# Patient Record
Sex: Male | Born: 1955 | Race: White | Hispanic: No | Marital: Married | Smoking: Former smoker
Health system: Southern US, Community
[De-identification: ages and names within clinical notes are randomized; demographics above are authoritative.]

## PROBLEM LIST (undated history)

## (undated) DIAGNOSIS — I10 Essential (primary) hypertension: Secondary | ICD-10-CM

## (undated) DIAGNOSIS — I2510 Atherosclerotic heart disease of native coronary artery without angina pectoris: Secondary | ICD-10-CM

## (undated) DIAGNOSIS — K635 Polyp of colon: Secondary | ICD-10-CM

## (undated) DIAGNOSIS — Z86718 Personal history of other venous thrombosis and embolism: Secondary | ICD-10-CM

## (undated) HISTORY — PX: CORONARY STENT INTERVENTION: CATH118234

---

## 2011-12-01 DIAGNOSIS — M549 Dorsalgia, unspecified: Secondary | ICD-10-CM | POA: Insufficient documentation

## 2012-07-06 DIAGNOSIS — R55 Syncope and collapse: Secondary | ICD-10-CM | POA: Insufficient documentation

## 2012-07-06 DIAGNOSIS — R350 Frequency of micturition: Secondary | ICD-10-CM | POA: Insufficient documentation

## 2012-07-06 DIAGNOSIS — K769 Liver disease, unspecified: Secondary | ICD-10-CM | POA: Diagnosis present

## 2012-08-16 DIAGNOSIS — G2581 Restless legs syndrome: Secondary | ICD-10-CM | POA: Insufficient documentation

## 2012-11-29 DIAGNOSIS — R209 Unspecified disturbances of skin sensation: Secondary | ICD-10-CM | POA: Insufficient documentation

## 2012-11-29 DIAGNOSIS — N400 Enlarged prostate without lower urinary tract symptoms: Secondary | ICD-10-CM | POA: Insufficient documentation

## 2013-01-17 DIAGNOSIS — D126 Benign neoplasm of colon, unspecified: Secondary | ICD-10-CM | POA: Insufficient documentation

## 2013-03-11 DIAGNOSIS — E785 Hyperlipidemia, unspecified: Secondary | ICD-10-CM | POA: Insufficient documentation

## 2013-03-11 DIAGNOSIS — I10 Essential (primary) hypertension: Secondary | ICD-10-CM | POA: Insufficient documentation

## 2013-03-11 DIAGNOSIS — I480 Paroxysmal atrial fibrillation: Secondary | ICD-10-CM | POA: Insufficient documentation

## 2013-03-11 DIAGNOSIS — K219 Gastro-esophageal reflux disease without esophagitis: Secondary | ICD-10-CM | POA: Insufficient documentation

## 2013-08-22 DIAGNOSIS — N529 Male erectile dysfunction, unspecified: Secondary | ICD-10-CM | POA: Insufficient documentation

## 2013-08-22 DIAGNOSIS — E538 Deficiency of other specified B group vitamins: Secondary | ICD-10-CM | POA: Insufficient documentation

## 2014-11-25 DIAGNOSIS — M722 Plantar fascial fibromatosis: Secondary | ICD-10-CM | POA: Insufficient documentation

## 2015-07-07 DIAGNOSIS — Z955 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: Secondary | ICD-10-CM | POA: Insufficient documentation

## 2015-07-07 DIAGNOSIS — R079 Chest pain, unspecified: Secondary | ICD-10-CM | POA: Insufficient documentation

## 2017-12-19 ENCOUNTER — Emergency Department (HOSPITAL_BASED_OUTPATIENT_CLINIC_OR_DEPARTMENT_OTHER)
Admission: EM | Admit: 2017-12-19 | Discharge: 2017-12-19 | Disposition: A | Discharge status: Home / Self Care | Payer: 59 | Attending: Emergency Medicine | Admitting: Emergency Medicine

## 2017-12-19 ENCOUNTER — Other Ambulatory Visit: Payer: Self-pay

## 2017-12-19 ENCOUNTER — Encounter (HOSPITAL_BASED_OUTPATIENT_CLINIC_OR_DEPARTMENT_OTHER): Payer: Self-pay

## 2017-12-19 DIAGNOSIS — Y999 Unspecified external cause status: Secondary | ICD-10-CM | POA: Diagnosis not present

## 2017-12-19 DIAGNOSIS — Y939 Activity, unspecified: Secondary | ICD-10-CM | POA: Diagnosis not present

## 2017-12-19 DIAGNOSIS — X58XXXA Exposure to other specified factors, initial encounter: Secondary | ICD-10-CM | POA: Diagnosis not present

## 2017-12-19 DIAGNOSIS — Z87891 Personal history of nicotine dependence: Secondary | ICD-10-CM | POA: Diagnosis not present

## 2017-12-19 DIAGNOSIS — S76311A Strain of muscle, fascia and tendon of the posterior muscle group at thigh level, right thigh, initial encounter: Secondary | ICD-10-CM | POA: Diagnosis not present

## 2017-12-19 DIAGNOSIS — Y929 Unspecified place or not applicable: Secondary | ICD-10-CM | POA: Diagnosis not present

## 2017-12-19 DIAGNOSIS — I2510 Atherosclerotic heart disease of native coronary artery without angina pectoris: Secondary | ICD-10-CM | POA: Diagnosis not present

## 2017-12-19 DIAGNOSIS — S76301A Unspecified injury of muscle, fascia and tendon of the posterior muscle group at thigh level, right thigh, initial encounter: Secondary | ICD-10-CM | POA: Diagnosis present

## 2017-12-19 DIAGNOSIS — Z955 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: Secondary | ICD-10-CM | POA: Insufficient documentation

## 2017-12-19 HISTORY — DX: Personal history of other venous thrombosis and embolism: Z86.718

## 2017-12-19 HISTORY — DX: Atherosclerotic heart disease of native coronary artery without angina pectoris: I25.10

## 2017-12-19 MED ORDER — KETOROLAC TROMETHAMINE 15 MG/ML IJ SOLN
15.0000 mg | Freq: Once | INTRAMUSCULAR | Status: AC
  Administered 2017-12-19: 15 mg via INTRAMUSCULAR
  Filled 2017-12-19: qty 1

## 2017-12-19 MED ORDER — DICLOFENAC SODIUM 1 % TD GEL: 4.0000 g | Freq: Four times a day (QID) | TRANSDERMAL | 0 refills | Status: DC

## 2017-12-19 MED ORDER — ACETAMINOPHEN 500 MG PO TABS
1000.0000 mg | ORAL_TABLET | Freq: Once | ORAL | Status: AC
  Administered 2017-12-19: 1000 mg via ORAL
  Filled 2017-12-19: qty 2

## 2017-12-19 MED ORDER — OXYCODONE HCL 5 MG PO TABS
5.0000 mg | ORAL_TABLET | Freq: Once | ORAL | Status: AC
  Administered 2017-12-19: 5 mg via ORAL
  Filled 2017-12-19: qty 1

## 2017-12-19 MED ORDER — DIAZEPAM 2 MG PO TABS
2.0000 mg | ORAL_TABLET | Freq: Once | ORAL | Status: AC
  Administered 2017-12-19: 2 mg via ORAL
  Filled 2017-12-19: qty 1

## 2017-12-19 MED FILL — DICLOFENAC SODIUM 1% GEL: 1 | 6 days supply | Qty: 100 | Fill #0

## 2017-12-19 NOTE — ED Triage Notes (Addendum)
C/o right hip pain radiates down right LE x 5 days-denies injury-to triage in w/c-NAD-pt felt better standing thought last half triage and walking to tx room

## 2017-12-19 NOTE — ED Provider Notes (Signed)
[facility] EMERGENCY DEPARTMENT Provider Note   CSN: [id number] Arrival date & time: 12/19/17  1110     History   Chief Complaint Chief Complaint  Patient presents with  . Hip Pain    HPI Jeremy LentSteve W Ryan is a 62 y.o. male.  62 yo M with a chief complaints of right leg pain.  Going on for the past couple days.  No noted injury.  Worse with sitting.  Denies leg weakness or numbness denies loss of bowel or bladder denies back pain.  Denies history of low back pain denies recent surgery or implementation of his back.  The history is provided by the patient.  Hip Pain  This is a new problem. The current episode started more than 2 days ago. The problem occurs constantly. The problem has been gradually worsening. Pertinent negatives include no chest pain, no abdominal pain, no headaches and no shortness of breath. The symptoms are aggravated by bending and twisting (sitting). Nothing relieves the symptoms. He has tried nothing for the symptoms. The treatment provided no relief.    Past Medical History:  Diagnosis Date  . Coronary artery disease   . History of blood clots     There are no active problems to display for this patient.   Past Surgical History:  Procedure Laterality Date  . CORONARY STENT INTERVENTION          Home Medications    Prior to Admission medications   Medication Sig Start Date End Date Taking? Authorizing Provider  diclofenac sodium (VOLTAREN) 1 % GEL Apply 4 g topically 4 (four) times daily. 12/19/17   Melene PlanFloyd, Bronsyn Shappell, DO    Family History No family history on file.  Social History Social History   Tobacco Use  . Smoking status: Former Games developermoker  . Smokeless tobacco: Never Used  Substance Use Topics  . Alcohol use: Never    Frequency: Never  . Drug use: Never     Allergies   Patient has no known allergies.   Review of Systems Review of Systems  Constitutional: Negative for chills and fever.  HENT: Negative for congestion and  facial swelling.   Eyes: Negative for discharge and visual disturbance.  Respiratory: Negative for shortness of breath.   Cardiovascular: Negative for chest pain and palpitations.  Gastrointestinal: Negative for abdominal pain, diarrhea and vomiting.  Musculoskeletal: Positive for arthralgias and gait problem. Negative for myalgias.  Skin: Negative for color change and rash.  Neurological: Negative for tremors, syncope and headaches.  Psychiatric/Behavioral: Negative for confusion and dysphoric mood.     Physical Exam Updated Vital Signs BP 134/87 (BP Location: Right Arm)   Pulse 60   Temp 98.4 F (36.9 C) (Oral)   Resp 18   Ht 5\' 10"  (1.778 m)   Wt 98.9 kg   SpO2 98%   BMI 31.28 kg/m   Physical Exam  Constitutional: He is oriented to person, place, and time. He appears well-developed and well-nourished.  HENT:  Head: Normocephalic and atraumatic.  Eyes: Pupils are equal, round, and reactive to light. EOM are normal.  Neck: Normal range of motion. Neck supple. No JVD present.  Cardiovascular: Normal rate and regular rhythm. Exam reveals no gallop and no friction rub.  No murmur heard. Pulmonary/Chest: No respiratory distress. He has no wheezes.  Abdominal: He exhibits no distension. There is no rebound and no guarding.  Musculoskeletal: Normal range of motion. He exhibits tenderness.  Tender to palpation worse at the fascia at the attachment  of the hamstring, reproduces his pain.  No pain with internal or external rotation of the leg, pulse motor and sensation is intact distally.  Ambulatory.  No clonus reflexes are 2+.  Neurological: He is alert and oriented to person, place, and time.  Skin: No rash noted. No pallor.  Psychiatric: He has a normal mood and affect. His behavior is normal.  Nursing note and vitals reviewed.    ED Treatments / Results  Labs (all labs ordered are listed, but only abnormal results are displayed) Labs Reviewed - No data to  display  EKG None  Radiology No results found.  Procedures Procedures (including critical care time)  Medications Ordered in ED Medications  acetaminophen (TYLENOL) tablet 1,000 mg (has no administration in time range)  ketorolac (TORADOL) 15 MG/ML injection 15 mg (has no administration in time range)  oxyCODONE (Oxy IR/ROXICODONE) immediate release tablet 5 mg (has no administration in time range)  diazepam (VALIUM) tablet 2 mg (has no administration in time range)     Initial Impression / Assessment and Plan / ED Course  I have reviewed the triage vital signs and the nursing notes.  Pertinent labs & imaging results that were available during my care of the patient were reviewed by me and considered in my medical decision making (see chart for details).     62 yo M with a chief complaint of right leg pain.  Pain is at the attachment of the hamstring to the issue him, most likely muscular strain.  We will have the patient follow-up with his family doctor.  Conservative therapy.  Discharge home.  11:49 AM:  I have discussed the diagnosis/risks/treatment options with the patient and family and believe the pt to be eligible for discharge home to follow-up with PCP. We also discussed returning to the ED immediately if new or worsening sx occur. We discussed the sx which are most concerning (e.g., sudden worsening pain, fever, inability to tolerate by mouth) that necessitate immediate return. Medications administered to the patient during their visit and any new prescriptions provided to the patient are listed below.  Medications given during this visit Medications  acetaminophen (TYLENOL) tablet 1,000 mg (has no administration in time range)  ketorolac (TORADOL) 15 MG/ML injection 15 mg (has no administration in time range)  oxyCODONE (Oxy IR/ROXICODONE) immediate release tablet 5 mg (has no administration in time range)  diazepam (VALIUM) tablet 2 mg (has no administration in time  range)      The patient appears reasonably screen and/or stabilized for discharge and I doubt any other medical condition or other [facility] requiring further screening, evaluation, or treatment in the ED at this time prior to discharge.    Final Clinical Impressions(s) / ED Diagnoses   Final diagnoses:  Hamstring strain, right, initial encounter    ED Discharge Orders         Ordered    diclofenac sodium (VOLTAREN) 1 % GEL  4 times daily     12/19/17 1138           Melene Plan, DO 12/19/17 1149

## 2017-12-19 NOTE — Discharge Instructions (Signed)
Take tylenol 1000mg(2 extra strength) four times a day.  ° °

## 2018-11-05 ENCOUNTER — Other Ambulatory Visit: Payer: Self-pay

## 2018-11-05 DIAGNOSIS — Z20822 Contact with and (suspected) exposure to covid-19: Secondary | ICD-10-CM

## 2018-11-07 LAB — SPECIMEN STATUS REPORT

## 2018-11-07 LAB — NOVEL CORONAVIRUS, NAA: SARS-CoV-2, NAA: NOT DETECTED

## 2020-02-17 DIAGNOSIS — E785 Hyperlipidemia, unspecified: Secondary | ICD-10-CM | POA: Insufficient documentation

## 2020-02-17 DIAGNOSIS — I2510 Atherosclerotic heart disease of native coronary artery without angina pectoris: Secondary | ICD-10-CM | POA: Insufficient documentation

## 2020-02-17 DIAGNOSIS — B029 Zoster without complications: Secondary | ICD-10-CM | POA: Insufficient documentation

## 2020-02-17 DIAGNOSIS — R7303 Prediabetes: Secondary | ICD-10-CM | POA: Insufficient documentation

## 2020-02-19 ENCOUNTER — Emergency Department (HOSPITAL_BASED_OUTPATIENT_CLINIC_OR_DEPARTMENT_OTHER)
Admission: EM | Admit: 2020-02-19 | Discharge: 2020-02-19 | Disposition: A | Discharge status: Home / Self Care | Payer: Medicare Other | Attending: Emergency Medicine | Admitting: Emergency Medicine

## 2020-02-19 ENCOUNTER — Encounter (HOSPITAL_BASED_OUTPATIENT_CLINIC_OR_DEPARTMENT_OTHER): Payer: Self-pay

## 2020-02-19 ENCOUNTER — Other Ambulatory Visit: Payer: Self-pay

## 2020-02-19 ENCOUNTER — Emergency Department (HOSPITAL_BASED_OUTPATIENT_CLINIC_OR_DEPARTMENT_OTHER): Payer: Medicare Other

## 2020-02-19 DIAGNOSIS — Z87891 Personal history of nicotine dependence: Secondary | ICD-10-CM | POA: Diagnosis not present

## 2020-02-19 DIAGNOSIS — Z955 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: Secondary | ICD-10-CM | POA: Diagnosis not present

## 2020-02-19 DIAGNOSIS — Z8616 Personal history of COVID-19: Secondary | ICD-10-CM | POA: Diagnosis not present

## 2020-02-19 DIAGNOSIS — R0602 Shortness of breath: Secondary | ICD-10-CM | POA: Diagnosis present

## 2020-02-19 DIAGNOSIS — I2510 Atherosclerotic heart disease of native coronary artery without angina pectoris: Secondary | ICD-10-CM | POA: Insufficient documentation

## 2020-02-19 DIAGNOSIS — I10 Essential (primary) hypertension: Secondary | ICD-10-CM | POA: Insufficient documentation

## 2020-02-19 HISTORY — DX: Essential (primary) hypertension: I10

## 2020-02-19 LAB — BASIC METABOLIC PANEL
Anion gap: 10 (ref 5–15)
BUN: 26 mg/dL — ABNORMAL HIGH (ref 8–23)
CO2: 24 mmol/L (ref 22–32)
Calcium: 9.6 mg/dL (ref 8.9–10.3)
Chloride: 103 mmol/L (ref 98–111)
Creatinine, Ser: 0.86 mg/dL (ref 0.61–1.24)
GFR, Estimated: >60 mL/min (ref >60)
Glucose, Bld: 150 mg/dL — ABNORMAL HIGH (ref 70–99)
Potassium: 4.1 mmol/L (ref 3.5–5.1)
Sodium: 137 mmol/L (ref 135–145)

## 2020-02-19 LAB — CBC
HCT: 45 % (ref 39.0–52.0)
Hemoglobin: 15.5 g/dL (ref 13.0–17.0)
MCH: 28.4 pg (ref 26.0–34.0)
MCHC: 34.4 g/dL (ref 30.0–36.0)
MCV: 82.4 fL (ref 80.0–100.0)
Platelets: 205 10*3/uL (ref 150–400)
RBC: 5.46 MIL/uL (ref 4.22–5.81)
RDW: 13.7 % (ref 11.5–15.5)
WBC: 6.6 10*3/uL (ref 4.0–10.5)
nRBC: 0 % (ref 0.0–0.2)

## 2020-02-19 LAB — TROPONIN I (HIGH SENSITIVITY): Troponin I (High Sensitivity): 5 ng/L (ref <18)

## 2020-02-19 LAB — BRAIN NATRIURETIC PEPTIDE: B Natriuretic Peptide: 37.7 pg/mL (ref 0.0–100.0)

## 2020-02-19 MED ORDER — AZITHROMYCIN 250 MG PO TABS
500.0000 mg | ORAL_TABLET | Freq: Once | ORAL | Status: AC
  Administered 2020-02-19: 500 mg via ORAL
  Filled 2020-02-19: qty 2

## 2020-02-19 MED ORDER — PREDNISONE 20 MG PO TABS
40.0000 mg | ORAL_TABLET | Freq: Once | ORAL | Status: AC
  Administered 2020-02-19: 40 mg via ORAL
  Filled 2020-02-19: qty 2

## 2020-02-19 MED ORDER — IOHEXOL 350 MG/ML SOLN
100.0000 mL | Freq: Once | INTRAVENOUS | Status: AC | PRN
  Administered 2020-02-19: 100 mL via INTRAVENOUS

## 2020-02-19 MED ORDER — AZITHROMYCIN 250 MG PO TABS: 250.0000 mg | ORAL_TABLET | Freq: Every day | ORAL | 0 refills | Status: AC

## 2020-02-19 MED ORDER — AEROCHAMBER PLUS FLO-VU LARGE MISC
1.0000 | Freq: Once | Status: AC
  Administered 2020-02-19: 1
  Filled 2020-02-19: qty 1

## 2020-02-19 MED ORDER — ALBUTEROL SULFATE HFA 108 (90 BASE) MCG/ACT IN AERS
8.0000 | INHALATION_SPRAY | Freq: Once | RESPIRATORY_TRACT | Status: AC
  Administered 2020-02-19: 8 via RESPIRATORY_TRACT
  Filled 2020-02-19: qty 6.7

## 2020-02-19 MED ORDER — ALBUTEROL SULFATE (2.5 MG/3ML) 0.083% IN NEBU
2.5000 mg | INHALATION_SOLUTION | Freq: Once | RESPIRATORY_TRACT | Status: AC
  Administered 2020-02-19: 2.5 mg via RESPIRATORY_TRACT
  Filled 2020-02-19: qty 3

## 2020-02-19 MED ORDER — PREDNISONE 10 MG PO TABS: 40.0000 mg | ORAL_TABLET | Freq: Every day | ORAL | 0 refills | Status: DC

## 2020-02-19 NOTE — ED Notes (Signed)
Pt on monitor and vitals cycling 

## 2020-02-19 NOTE — ED Triage Notes (Signed)
Pt states when he was getting dressed he started to get tingly all over and couldn't catch his breath. Pt reports aching pain on the R side of his chest along with shortness of breath. Pt states he feels nauseous. Pt endorses a stent placed 2013.

## 2020-02-19 NOTE — Discharge Instructions (Signed)
Use the albuterol inhaler at home, 4 puffs every 4 hours as needed, if you feel that you need more than that return to the hospital.  Take the antibiotics and the steroids for the next 4 days follow-up with your primary doctor for routine checkups and further evaluation of your shortness of breath.

## 2020-02-19 NOTE — ED Notes (Signed)
Pt has seen some improvement of abdominal pain from 10 to 7 after medication

## 2020-02-19 NOTE — ED Provider Notes (Signed)
[facility] EMERGENCY DEPARTMENT Provider Note   CSN: [id number] Arrival date & time: 02/19/20  0751     History Chief Complaint  Patient presents with  . Chest Pain    Jeremy Ryan is a 65 y.o. male.   Shortness of Breath Severity:  Severe Onset quality:  Sudden Timing:  Constant Progression:  Worsening Chronicity:  New Context: URI (recent covid)   Relieved by:  Nothing Worsened by:  Deep breathing Ineffective treatments:  None tried Associated symptoms: no chest pain, no cough, no fever, no headaches, no rash, no sputum production and no vomiting        Past Medical History:  Diagnosis Date  . Coronary artery disease   . History of blood clots   . Hypertension     There are no problems to display for this patient.   Past Surgical History:  Procedure Laterality Date  . CORONARY STENT INTERVENTION         History reviewed. No pertinent family history.  Social History   Tobacco Use  . Smoking status: Former Games developer  . Smokeless tobacco: Never Used  Vaping Use  . Vaping Use: Never used  Substance Use Topics  . Alcohol use: Never  . Drug use: Never    Home Medications Prior to Admission medications   Medication Sig Start Date End Date Taking? Authorizing Provider  azithromycin (ZITHROMAX) 250 MG tablet Take 1 tablet (250 mg total) by mouth daily for 4 days. Take first 2 tablets together, then 1 every day until finished. 02/19/20 02/23/20 Yes Sabino Donovan, MD  predniSONE (DELTASONE) 10 MG tablet Take 4 tablets (40 mg total) by mouth daily. 02/19/20  Yes Sabino Donovan, MD  diclofenac sodium (VOLTAREN) 1 % GEL Apply 4 g topically 4 (four) times daily. 12/19/17   Melene Plan, DO    Allergies    Patient has no known allergies.  Review of Systems   Review of Systems  Constitutional: Negative for chills and fever.  HENT: Negative for congestion and rhinorrhea.   Respiratory: Positive for chest tightness and shortness of breath. Negative for  cough and sputum production.   Cardiovascular: Negative for chest pain and palpitations.  Gastrointestinal: Negative for diarrhea, nausea and vomiting.  Genitourinary: Negative for difficulty urinating and dysuria.  Musculoskeletal: Negative for arthralgias and back pain.  Skin: Negative for color change and rash.  Neurological: Negative for light-headedness and headaches.    Physical Exam Updated Vital Signs BP 109/72   Pulse 60   Temp 98.1 F (36.7 C) (Oral)   Resp 16   Ht 5\' 10"  (1.778 m)   Wt 106.6 kg   SpO2 96%   BMI 33.72 kg/m   Physical Exam Vitals and nursing note reviewed.  Constitutional:      Appearance: He is well-developed and well-nourished.  HENT:     Head: Normocephalic and atraumatic.  Eyes:     Conjunctiva/sclera: Conjunctivae normal.  Cardiovascular:     Rate and Rhythm: Normal rate and regular rhythm.     Heart sounds: No murmur heard. No friction rub. No gallop. No S3 or S4 sounds.   Pulmonary:     Effort: Pulmonary effort is normal. No respiratory distress.     Breath sounds: Normal breath sounds. No decreased breath sounds, wheezing or rhonchi.  Abdominal:     Palpations: Abdomen is soft.     Tenderness: There is no abdominal tenderness.  Musculoskeletal:        General: No edema.  Cervical back: Neck supple.     Right lower leg: No edema.     Left lower leg: No edema.  Skin:    General: Skin is warm and dry.  Neurological:     Mental Status: He is alert.  Psychiatric:        Mood and Affect: Mood and affect normal.     ED Results / Procedures / Treatments   Labs (all labs ordered are listed, but only abnormal results are displayed) Labs Reviewed  BASIC METABOLIC PANEL - Abnormal; Notable for the following components:      Result Value   Glucose, Bld 150 (*)    BUN 26 (*)    All other components within normal limits  CBC  BRAIN NATRIURETIC PEPTIDE  TROPONIN I (HIGH SENSITIVITY)  TROPONIN I (HIGH SENSITIVITY)    EKG EKG  Interpretation  Date/Time:  Thursday February 19 2020 07:55:00 EST Ventricular Rate:  69 PR Interval:    QRS Duration: 106 QT Interval:  386 QTC Calculation: 414 R Axis:   68 Text Interpretation: Sinus rhythm Borderline short PR interval Low voltage, precordial leads Confirmed by Cherlynn Perches ([id number]) on 02/19/2020 8:01:30 AM   Radiology CT Angio Chest PE W and/or Wo Contrast  Result Date: 02/19/2020 CLINICAL DATA:  Rule out pulmonary embolus. High probability. Pain with breathing. EXAM: CT ANGIOGRAPHY CHEST WITH CONTRAST TECHNIQUE: Multidetector CT imaging of the chest was performed using the standard protocol during bolus administration of intravenous contrast. Multiplanar CT image reconstructions and MIPs were obtained to evaluate the vascular anatomy. CONTRAST:  OMNIPAQUE IOHEXOL 350 MG/ML SOLN COMPARISON:  06/24/2015 FINDINGS: Cardiovascular: Normal heart size. No pericardial effusion identified. 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 There are no abnormal filling defects within the main pulmonary artery or its branches to suggest an acute pulmonary embolus. Mediastinum/Nodes: No enlarged mediastinal, hilar, or axillary lymph nodes. Thyroid gland, trachea, and esophagus demonstrate no significant findings. Lungs/Pleura: No pleural effusion, airspace consolidation, or atelectasis. No suspicious nodule or mass. Upper Abdomen: No acute abnormality. Musculoskeletal: No chest wall abnormality. No acute or significant osseous findings. Review of the MIP images confirms the above findings. IMPRESSION: 1. No evidence for acute pulmonary embolus. 2. 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 Electronically Signed   By: Signa Kell M.D.   On: 02/19/2020 08:48    Procedures Procedures (including critical care time)  Medications Ordered in ED Medications  albuterol (PROVENTIL) (2.5 MG/3ML) 0.083% nebulizer solution 2.5 mg (2.5 mg  Nebulization Given 02/19/20 0845)  iohexol (OMNIPAQUE) 350 MG/ML injection 100 mL (100 mLs Intravenous Contrast Given 02/19/20 0830)  predniSONE (DELTASONE) tablet 40 mg (40 mg Oral Given 02/19/20 1002)  azithromycin (ZITHROMAX) tablet 500 mg (500 mg Oral Given 02/19/20 1003)  albuterol (VENTOLIN HFA) 108 (90 Base) MCG/ACT inhaler 8 puff (8 puffs Inhalation Given 02/19/20 1028)  AeroChamber Plus Flo-Vu Large MISC 1 each (1 each Other Given 02/19/20 1029)    ED Course  I have reviewed the triage vital signs and the nursing notes.  Pertinent labs & imaging results that were available during my care of the patient were reviewed by me and considered in my medical decision making (see chart for details).    MDM Rules/Calculators/A&P                          65 year old male history of coronary artery disease history of blood clots, comes today with shortness  of breath difficult to describe does not feel like anything has had in the past.  Recently suffered from COVID and was feeling better until this morning.  Imaging reviewed by myself shows no pericardial effusion no blood clot, there is a dilated pulmonary artery could be consistent with pulmonary hypertension.  The patient does have a significant cigarette smoking history though he is quit.  That being the case we did try albuterol and it gave significant symptomatic relief.  We will give more albuterol with spacer to go home with and steroids as well as azithromycin.  He may have undiagnosed COPD versus underlying reactive airway disease.  EKG reviewed by me shows a sinus rhythm without acute ischemic change interval abnormality or arrhythmia.  Troponin is negative.  Do not feel that we need any further testing other screening labs include BNP and chemistry are unremarkable.  Patient also feels that he is safe for discharge home and feels comfortable with breathing treatments and how well they work.  He is given prescription for steroids and antibiotics.   Strict return precautions discussed. Final Clinical Impression(s) / ED Diagnoses Final diagnoses:  SOB (shortness of breath)    Rx / DC Orders ED Discharge Orders         Ordered    azithromycin (ZITHROMAX) 250 MG tablet  Daily        02/19/20 1021    predniSONE (DELTASONE) 10 MG tablet  Daily        02/19/20 1021           Sabino Donovan, MD 02/19/20 1100

## 2020-02-19 NOTE — ED Notes (Signed)
Patient transported to

## 2020-02-28 LAB — COLOGUARD: COLOGUARD: POSITIVE — AB

## 2020-06-07 ENCOUNTER — Emergency Department (HOSPITAL_BASED_OUTPATIENT_CLINIC_OR_DEPARTMENT_OTHER): Payer: Medicare (Managed Care)

## 2020-06-07 ENCOUNTER — Encounter (HOSPITAL_BASED_OUTPATIENT_CLINIC_OR_DEPARTMENT_OTHER): Payer: Self-pay

## 2020-06-07 ENCOUNTER — Other Ambulatory Visit: Payer: Self-pay

## 2020-06-07 ENCOUNTER — Emergency Department (HOSPITAL_BASED_OUTPATIENT_CLINIC_OR_DEPARTMENT_OTHER)
Admission: EM | Admit: 2020-06-07 | Discharge: 2020-06-07 | Disposition: A | Discharge status: Home / Self Care | Payer: Medicare (Managed Care) | Attending: Emergency Medicine | Admitting: Emergency Medicine

## 2020-06-07 DIAGNOSIS — R55 Syncope and collapse: Secondary | ICD-10-CM | POA: Diagnosis not present

## 2020-06-07 DIAGNOSIS — Z87891 Personal history of nicotine dependence: Secondary | ICD-10-CM | POA: Diagnosis not present

## 2020-06-07 DIAGNOSIS — R1012 Left upper quadrant pain: Secondary | ICD-10-CM | POA: Diagnosis not present

## 2020-06-07 DIAGNOSIS — Z955 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: Secondary | ICD-10-CM | POA: Insufficient documentation

## 2020-06-07 DIAGNOSIS — I10 Essential (primary) hypertension: Secondary | ICD-10-CM | POA: Diagnosis not present

## 2020-06-07 DIAGNOSIS — R197 Diarrhea, unspecified: Secondary | ICD-10-CM | POA: Insufficient documentation

## 2020-06-07 DIAGNOSIS — I2510 Atherosclerotic heart disease of native coronary artery without angina pectoris: Secondary | ICD-10-CM | POA: Diagnosis not present

## 2020-06-07 DIAGNOSIS — R1030 Lower abdominal pain, unspecified: Secondary | ICD-10-CM | POA: Diagnosis not present

## 2020-06-07 HISTORY — DX: Polyp of colon: K63.5

## 2020-06-07 LAB — CBC
HCT: 46.9 % (ref 39.0–52.0)
Hemoglobin: 15.9 g/dL (ref 13.0–17.0)
MCH: 28.2 pg (ref 26.0–34.0)
MCHC: 33.9 g/dL (ref 30.0–36.0)
MCV: 83.2 fL (ref 80.0–100.0)
Platelets: 257 10*3/uL (ref 150–400)
RBC: 5.64 MIL/uL (ref 4.22–5.81)
RDW: 13 % (ref 11.5–15.5)
WBC: 7.6 10*3/uL (ref 4.0–10.5)
nRBC: 0 % (ref 0.0–0.2)

## 2020-06-07 LAB — BASIC METABOLIC PANEL
Anion gap: 8 (ref 5–15)
BUN: 18 mg/dL (ref 8–23)
CO2: 26 mmol/L (ref 22–32)
Calcium: 9.5 mg/dL (ref 8.9–10.3)
Chloride: 102 mmol/L (ref 98–111)
Creatinine, Ser: 0.78 mg/dL (ref 0.61–1.24)
GFR, Estimated: >60 mL/min (ref >60)
Glucose, Bld: 121 mg/dL — ABNORMAL HIGH (ref 70–99)
Potassium: 4.1 mmol/L (ref 3.5–5.1)
Sodium: 136 mmol/L (ref 135–145)

## 2020-06-07 LAB — CBG MONITORING, ED: Glucose-Capillary: 106 mg/dL — ABNORMAL HIGH (ref 70–99)

## 2020-06-07 MED ORDER — IOHEXOL 300 MG/ML  SOLN
100.0000 mL | Freq: Once | INTRAMUSCULAR | Status: AC | PRN
  Administered 2020-06-07: 100 mL via INTRAVENOUS

## 2020-06-07 NOTE — ED Triage Notes (Signed)
Pt states he passed out ~4am after diarrheal BM/lower abc pain-states he had colonoscopy 5/6-NAD-steady gait

## 2020-06-07 NOTE — Discharge Instructions (Signed)
Follow up with your GI doc.  Return for worsening symptoms.  °

## 2020-06-07 NOTE — ED Provider Notes (Signed)
[facility] EMERGENCY DEPARTMENT Provider Note   CSN: [id number] Arrival date & time: 06/07/20  1144     History Chief Complaint  Patient presents with  . Loss of Consciousness    Jeremy Ryan is a 65 y.o. male.  65 yo M with a chief complaints of lower abdominal pain and diarrhea.  This is after he had a colonoscopy done about 3 or 4 days ago.  He reportedly had no significant symptoms afterwards and then this morning realized that he had lower abdominal pain and had very profuse watery stool.  He passed out during this event.  Denies any injury during the event.  Denied headache chest pain neck pain shortness of breath.  Feels a bit better now.  Continues to have lower abdominal tenderness.  On further review of systems the patient does endorse that he had chronic diarrhea at baseline and that was why he had his colonoscopy done.  Has been having abdominal pain with that as well.  The history is provided by the patient.  Loss of Consciousness Episode history:  Single Most recent episode:  Today Duration:  2 minutes Timing:  Rare Progression:  Resolved Chronicity:  New Context: bowel movement   Witnessed: no   Relieved by:  Nothing Worsened by:  Nothing Ineffective treatments:  None tried Associated symptoms: no chest pain, no confusion, no fever, no headaches, no palpitations, no shortness of breath and no vomiting        Past Medical History:  Diagnosis Date  . Colon polyps   . Coronary artery disease   . History of blood clots   . Hypertension     There are no problems to display for this patient.   Past Surgical History:  Procedure Laterality Date  . CORONARY STENT INTERVENTION         No family history on file.  Social History   Tobacco Use  . Smoking status: Former Games developer  . Smokeless tobacco: Never Used  Vaping Use  . Vaping Use: Never used  Substance Use Topics  . Alcohol use: Never  . Drug use: Never    Home Medications Prior  to Admission medications   Medication Sig Start Date End Date Taking? Authorizing Provider  diclofenac sodium (VOLTAREN) 1 % GEL Apply 4 g topically 4 (four) times daily. 12/19/17   Melene Plan, DO  predniSONE (DELTASONE) 10 MG tablet Take 4 tablets (40 mg total) by mouth daily. 02/19/20   Sabino Donovan, MD    Allergies    Patient has no known allergies.  Review of Systems   Review of Systems  Constitutional: Negative for chills and fever.  HENT: Negative for congestion and facial swelling.   Eyes: Negative for discharge and visual disturbance.  Respiratory: Negative for shortness of breath.   Cardiovascular: Positive for syncope. Negative for chest pain and palpitations.  Gastrointestinal: Positive for abdominal pain and diarrhea. Negative for vomiting.  Musculoskeletal: Negative for arthralgias and myalgias.  Skin: Negative for color change and rash.  Neurological: Positive for syncope. Negative for tremors and headaches.  Psychiatric/Behavioral: Negative for confusion and dysphoric mood.    Physical Exam Updated Vital Signs BP (!) 146/89 (BP Location: Right Arm)   Pulse (!) 53   Temp 98.8 F (37.1 C) (Oral)   Resp 16   Ht 5\' 10"  (1.778 m)   Wt 106.6 kg   SpO2 100%   BMI 33.72 kg/m   Physical Exam Vitals and nursing note reviewed.  Constitutional:  Appearance: He is well-developed.  HENT:     Head: Normocephalic and atraumatic.  Eyes:     Pupils: Pupils are equal, round, and reactive to light.  Neck:     Vascular: No JVD.  Cardiovascular:     Rate and Rhythm: Normal rate and regular rhythm.     Heart sounds: No murmur heard. No friction rub. No gallop.   Pulmonary:     Effort: No respiratory distress.     Breath sounds: No wheezing.  Abdominal:     General: There is no distension.     Tenderness: There is abdominal tenderness. There is no guarding or rebound.     Comments: Mild lower abdominal tenderness worse on the left than the right  Musculoskeletal:         General: Normal range of motion.     Cervical back: Normal range of motion and neck supple.  Skin:    Coloration: Skin is not pale.     Findings: No rash.  Neurological:     Mental Status: He is alert and oriented to person, place, and time.  Psychiatric:        Behavior: Behavior normal.     ED Results / Procedures / Treatments   Labs (all labs ordered are listed, but only abnormal results are displayed) Labs Reviewed  BASIC METABOLIC PANEL - Abnormal; Notable for the following components:      Result Value   Glucose, Bld 121 (*)    All other components within normal limits  CBG MONITORING, ED - Abnormal; Notable for the following components:   Glucose-Capillary 106 (*)    All other components within normal limits  CBC    EKG EKG Interpretation  Date/Time:  Monday Jun 07 2020 12:16:07 EDT Ventricular Rate:  59 PR Interval:  140 QRS Duration: 84 QT Interval:  430 QTC Calculation: 425 R Axis:   28 Text Interpretation: Sinus bradycardia Otherwise normal ECG no wpw, prolonged qt or brugada No significant change since last tracing Confirmed by Melene Plan [phone]) on 06/07/2020 2:07:33 PM   Radiology CT ABDOMEN PELVIS W CONTRAST  Result Date: 06/07/2020 CLINICAL DATA:  Lower abdominal pain and syncopal episode today. History of colonoscopy 06/04/2020. EXAM: CT ABDOMEN AND PELVIS WITH CONTRAST TECHNIQUE: Multidetector CT imaging of the abdomen and pelvis was performed using the standard protocol following bolus administration of intravenous contrast. CONTRAST:  100 mL OMNIPAQUE IOHEXOL 300 MG/ML  SOLN COMPARISON:  CT angiogram chest, abdomen and pelvis 06/24/2015. FINDINGS: Lower chest: Lung bases clear.  No pleural or pericardial effusion. Hepatobiliary: No focal liver abnormality is seen. No gallstones, gallbladder wall thickening, or biliary dilatation. The liver is diffusely low attenuating consistent with fatty infiltration. Pancreas: Unremarkable. No pancreatic ductal  dilatation or surrounding inflammatory changes. Spleen: Normal in size. Peripherally enhancing lesion in the anterior margin of the spleen measuring 2.2 cm in diameter is most consistent with a hemangioma and unchanged. Adrenals/Urinary Tract: The adrenal glands appear normal. 2-3 small cysts are seen in each kidney. The kidneys otherwise appear normal. Ureters and urinary bladder are negative. Stomach/Bowel: Stomach is within normal limits. Appendix appears normal. No evidence of bowel wall thickening, distention, or inflammatory changes. Vascular/Lymphatic: No significant vascular findings are present. No enlarged abdominal or pelvic lymph nodes. Reproductive: Mild prostatomegaly. Other: No free intraperitoneal air or fluid collection. Musculoskeletal: No acute or focal abnormality. IMPRESSION: No acute abnormality or finding to explain the patient's symptoms. Fatty infiltration of the liver. Unchanged small lesion in the spleen  most consistent with a hemangioma. Aortic Atherosclerosis (ICD10-I70.0). Electronically Signed   By: Drusilla Kanner M.D.   On: 06/07/2020 14:54    Procedures Procedures   Medications Ordered in ED Medications  iohexol (OMNIPAQUE) 300 MG/ML solution 100 mL (100 mLs Intravenous Contrast Given 06/07/20 1413)    ED Course  I have reviewed the triage vital signs and the nursing notes.  Pertinent labs & imaging results that were available during my care of the patient were reviewed by me and considered in my medical decision making (see chart for details).    MDM Rules/Calculators/A&P                          65 yo M with a chief complaint of a syncopal event while having a bowel movement.  Likely vasovagal by history however the patient just had a colonoscopy has abdominal pain and syncopized.  Ongoing abdominal pain post will obtain a CT scan.  Blood work.  Reassess.  CT negative.  Patient still with some mild lower abdominal pain.  May be his chronic pain?.  Blood work  otherwise unremarkable.  Likely vasovagal.  Discharge home.  GI follow-up.  7:07 AM:  I have discussed the diagnosis/risks/treatment options with the patient and family and believe the pt to be eligible for discharge home to follow-up with GI. We also discussed returning to the ED immediately if new or worsening sx occur. We discussed the sx which are most concerning (e.g., sudden worsening pain, fever, inability to tolerate by mouth) that necessitate immediate return. Medications administered to the patient during their visit and any new prescriptions provided to the patient are listed below.  Medications given during this visit Medications  iohexol (OMNIPAQUE) 300 MG/ML solution 100 mL (100 mLs Intravenous Contrast Given 06/07/20 1413)     The patient appears reasonably screen and/or stabilized for discharge and I doubt any other medical condition or other [facility] requiring further screening, evaluation, or treatment in the ED at this time prior to discharge.   Final Clinical Impression(s) / ED Diagnoses Final diagnoses:  Syncope and collapse  Left upper quadrant abdominal pain  Diarrhea, unspecified type    Rx / DC Orders ED Discharge Orders    None       Melene Plan, DO 06/08/20 7412

## 2020-12-10 LAB — HM COLONOSCOPY

## 2020-12-28 DIAGNOSIS — I209 Angina pectoris, unspecified: Secondary | ICD-10-CM | POA: Insufficient documentation

## 2020-12-28 DIAGNOSIS — R0609 Other forms of dyspnea: Secondary | ICD-10-CM | POA: Insufficient documentation

## 2021-08-15 ENCOUNTER — Encounter (HOSPITAL_BASED_OUTPATIENT_CLINIC_OR_DEPARTMENT_OTHER): Payer: Self-pay | Admitting: Emergency Medicine

## 2021-08-15 ENCOUNTER — Other Ambulatory Visit: Payer: Self-pay

## 2021-08-15 ENCOUNTER — Emergency Department (HOSPITAL_BASED_OUTPATIENT_CLINIC_OR_DEPARTMENT_OTHER)
Admission: EM | Admit: 2021-08-15 | Discharge: 2021-08-15 | Disposition: A | Discharge status: Home / Self Care | Payer: Medicare (Managed Care) | Attending: Emergency Medicine | Admitting: Emergency Medicine

## 2021-08-15 DIAGNOSIS — E871 Hypo-osmolality and hyponatremia: Secondary | ICD-10-CM | POA: Insufficient documentation

## 2021-08-15 DIAGNOSIS — E119 Type 2 diabetes mellitus without complications: Secondary | ICD-10-CM

## 2021-08-15 DIAGNOSIS — R739 Hyperglycemia, unspecified: Secondary | ICD-10-CM

## 2021-08-15 DIAGNOSIS — R1013 Epigastric pain: Secondary | ICD-10-CM | POA: Insufficient documentation

## 2021-08-15 DIAGNOSIS — E1165 Type 2 diabetes mellitus with hyperglycemia: Secondary | ICD-10-CM | POA: Diagnosis present

## 2021-08-15 LAB — COMPREHENSIVE METABOLIC PANEL
ALT: 45 U/L — ABNORMAL HIGH (ref 0–44)
AST: 34 U/L (ref 15–41)
Albumin: 4.1 g/dL (ref 3.5–5.0)
Alkaline Phosphatase: 112 U/L (ref 38–126)
Anion gap: 12 (ref 5–15)
BUN: 15 mg/dL (ref 8–23)
CO2: 25 mmol/L (ref 22–32)
Calcium: 9.3 mg/dL (ref 8.9–10.3)
Chloride: 93 mmol/L — ABNORMAL LOW (ref 98–111)
Creatinine, Ser: 0.97 mg/dL (ref 0.61–1.24)
GFR, Estimated: >60 mL/min (ref >60)
Glucose, Bld: 456 mg/dL — ABNORMAL HIGH (ref 70–99)
Potassium: 3.2 mmol/L — ABNORMAL LOW (ref 3.5–5.1)
Sodium: 130 mmol/L — ABNORMAL LOW (ref 135–145)
Total Bilirubin: 1.9 mg/dL — ABNORMAL HIGH (ref 0.3–1.2)
Total Protein: 7.9 g/dL (ref 6.5–8.1)

## 2021-08-15 LAB — URINALYSIS, MICROSCOPIC (REFLEX): WBC, UA: NONE SEEN WBC/hpf (ref 0–5)

## 2021-08-15 LAB — CBC
HCT: 44.8 % (ref 39.0–52.0)
Hemoglobin: 16 g/dL (ref 13.0–17.0)
MCH: 28 pg (ref 26.0–34.0)
MCHC: 35.7 g/dL (ref 30.0–36.0)
MCV: 78.3 fL — ABNORMAL LOW (ref 80.0–100.0)
Platelets: 256 10*3/uL (ref 150–400)
RBC: 5.72 MIL/uL (ref 4.22–5.81)
RDW: 13 % (ref 11.5–15.5)
WBC: 5.6 10*3/uL (ref 4.0–10.5)
nRBC: 0 % (ref 0.0–0.2)

## 2021-08-15 LAB — URINALYSIS, ROUTINE W REFLEX MICROSCOPIC
Bilirubin Urine: NEGATIVE
Glucose, UA: >=500 mg/dL — AB
Hgb urine dipstick: NEGATIVE
Ketones, ur: 15 mg/dL — AB
Leukocytes,Ua: NEGATIVE
Nitrite: NEGATIVE
Protein, ur: 30 mg/dL — AB
Specific Gravity, Urine: 1.015 (ref 1.005–1.030)
pH: 5.5 (ref 5.0–8.0)

## 2021-08-15 LAB — HEMOGLOBIN A1C
Hgb A1c MFr Bld: 12.8 % — ABNORMAL HIGH (ref 4.8–5.6)
Mean Plasma Glucose: 320.66 mg/dL

## 2021-08-15 LAB — CBG MONITORING, ED
Glucose-Capillary: 441 mg/dL — ABNORMAL HIGH (ref 70–99)
Glucose-Capillary: 284 mg/dL — ABNORMAL HIGH (ref 70–99)

## 2021-08-15 LAB — LIPASE, BLOOD: Lipase: 36 U/L (ref 11–51)

## 2021-08-15 MED ORDER — LACTATED RINGERS IV BOLUS
1000.0000 mL | Freq: Once | INTRAVENOUS | Status: AC
  Administered 2021-08-15: 1000 mL via INTRAVENOUS

## 2021-08-15 MED ORDER — LIVING WELL WITH DIABETES BOOK
Freq: Once | Status: AC
  Filled 2021-08-15: qty 1

## 2021-08-15 MED ORDER — METFORMIN HCL 500 MG PO TABS: 500.0000 mg | ORAL_TABLET | Freq: Two times a day (BID) | ORAL | 0 refills | Status: DC

## 2021-08-15 MED ORDER — METFORMIN HCL 500 MG PO TABS
500.0000 mg | ORAL_TABLET | Freq: Once | ORAL | Status: AC
  Administered 2021-08-15: 500 mg via ORAL
  Filled 2021-08-15: qty 1

## 2021-08-15 NOTE — Discharge Instructions (Addendum)
You were found to have high blood sugar today without other complications.  Given your elevated blood sugar levels and elevated hemoglobin A1c level, you have been diagnosed with diabetes type 2.  The IV fluids here have improved your blood sugars from the 400s to the 200s.  You are being started on a medication called metformin for elevated blood sugar.  Please take this twice a day.  You will likely need additional medications to help bring your blood sugars down.  Your primary care provider will be very important to help you determine what type of medication regimen you will need as well as with diabetes education.  Please return to the emergency department if you have significant abdominal pain, persistent vomiting, severe diarrhea or other concerns.

## 2021-08-15 NOTE — ED Triage Notes (Signed)
Pt states he was seen at his PCP earlier today and told he was dehydrated and diabetic. He states no bloodwork was done at the office and he is unsure where they gathered this information. He c/o intermittent dizziness/lightheadedness, polydipsia, polyuria, dry mouth and vomiting. CBG at triage 441. Endorses 35 lb weight loss in the last month without trying.

## 2021-08-15 NOTE — ED Provider Notes (Signed)
[facility] EMERGENCY DEPARTMENT Provider Note   CSN: [id number] Arrival date & time: 08/15/21  4166     History  Chief Complaint  Patient presents with   Hyperglycemia    Jeremy Ryan is a 66 y.o. male.  Patient with no significant past medical history presents to the emergency department from PCP due to concerns for nausea, vomiting, diarrhea, dehydration and hyperglycemia.  Patient reportedly had elevated blood sugars about a month ago during physical.  Over the July 4 holiday patient had nausea, vomiting, and diarrhea that he attributed to IBS.  This has waxed and waned, recently worsened.  Patient reports frequent urination, weight loss, and thirst since the beginning of June.  Followed up with PCP today who sent him into the emergency department for dehydration.  No previous diagnosis of diabetes.  Patient has had some upper abdominal tenderness.  Denies heavy NSAID or alcohol use.  No dysuria or hematuria.  No chest pain or shortness of breath.  Reports approximately 20 pound weight loss over the past month.  Previously seen at [facility], has PCP appointment with [place] in about 2 weeks to establish care.       Home Medications Prior to Admission medications   Medication Sig Start Date End Date Taking? Authorizing Provider  diclofenac sodium (VOLTAREN) 1 % GEL Apply 4 g topically 4 (four) times daily. 12/19/17   Melene Plan, DO  predniSONE (DELTASONE) 10 MG tablet Take 4 tablets (40 mg total) by mouth daily. 02/19/20   Sabino Donovan, MD      Allergies    Patient has no known allergies.    Review of Systems   Review of Systems  Physical Exam Updated Vital Signs BP (!) 151/101 (BP Location: Left Arm)   Pulse 80   Temp 98.2 F (36.8 C) (Oral)   Resp 20   Ht 5\' 10"  (1.778 m)   Wt 98.4 kg   SpO2 96%   BMI 31.14 kg/m   Physical Exam Vitals and nursing note reviewed.  Constitutional:      General: He is not in acute distress.    Appearance: He is  well-developed.  HENT:     Head: Normocephalic and atraumatic.     Right Ear: External ear normal.     Left Ear: External ear normal.     Nose: Nose normal.     Mouth/Throat:     Mouth: Mucous membranes are dry.  Eyes:     General:        Right eye: No discharge.        Left eye: No discharge.     Conjunctiva/sclera: Conjunctivae normal.  Cardiovascular:     Rate and Rhythm: Normal rate and regular rhythm.     Heart sounds: Normal heart sounds.  Pulmonary:     Effort: Pulmonary effort is normal.     Breath sounds: Normal breath sounds.  Abdominal:     Palpations: Abdomen is soft.     Tenderness: There is abdominal tenderness. There is no guarding or rebound.     Comments: Mild epigastric tenderness  Musculoskeletal:     Cervical back: Normal range of motion and neck supple.  Skin:    General: Skin is warm and dry.  Neurological:     General: No focal deficit present.     Mental Status: He is alert. Mental status is at baseline.     ED Results / Procedures / Treatments   Labs (all labs ordered  are listed, but only abnormal results are displayed) Labs Reviewed  COMPREHENSIVE METABOLIC PANEL - Abnormal; Notable for the following components:      Result Value   Sodium 130 (*)    Potassium 3.2 (*)    Chloride 93 (*)    Glucose, Bld 456 (*)    ALT 45 (*)    Total Bilirubin 1.9 (*)    All other components within normal limits  CBC - Abnormal; Notable for the following components:   MCV 78.3 (*)    All other components within normal limits  URINALYSIS, ROUTINE W REFLEX MICROSCOPIC - Abnormal; Notable for the following components:   Glucose, UA >=500 (*)    Ketones, ur 15 (*)    Protein, ur 30 (*)    All other components within normal limits  HEMOGLOBIN A1C - Abnormal; Notable for the following components:   Hgb A1c MFr Bld 12.8 (*)    All other components within normal limits  URINALYSIS, MICROSCOPIC (REFLEX) - Abnormal; Notable for the following components:    Bacteria, UA RARE (*)    All other components within normal limits  CBG MONITORING, ED - Abnormal; Notable for the following components:   Glucose-Capillary 441 (*)    All other components within normal limits  CBG MONITORING, ED - Abnormal; Notable for the following components:   Glucose-Capillary 284 (*)    All other components within normal limits  LIPASE, BLOOD  CBG MONITORING, ED    EKG None  Radiology No results found.  Procedures Procedures    Medications Ordered in ED Medications  metFORMIN (GLUCOPHAGE) tablet 500 mg (has no administration in time range)  lactated ringers bolus 1,000 mL (1,000 mLs Intravenous New Bag/Given 08/15/21 2204)  lactated ringers bolus 1,000 mL (0 mLs Intravenous Stopped 08/15/21 2201)  living well with diabetes book MISC ( Does not apply Given 08/15/21 2257)    ED Course/ Medical Decision Making/ A&P   Patient seen and examined. History obtained directly from patient. Work-up including labs, imaging, EKG ordered in triage, if performed, were reviewed.    Labs/EKG: Independently reviewed and interpreted.  This included: CBC with normal white blood cell count, normal red blood cell count; CMP with hyponatremia 130 corrects to normal, potassium 3.2, chloride 93, glucose 456, normal anion gap; lipase normal; UA demonstrating glucose and 15 ketones, no signs of infection.   Added hemoglobin A1c.  Imaging: None ordered.  Patient with no severe or focal tenderness on exam.  Considered CT imaging of the abdomen pelvis, however will defer unless symptoms worsen.  Medications/Fluids: Ordered: 2 L LR.   Most recent vital signs reviewed and are as follows: BP (!) 151/101 (BP Location: Left Arm)   Pulse 80   Temp 98.2 F (36.8 C) (Oral)   Resp 20   Ht 5\' 10"  (1.778 m)   Wt 98.4 kg   SpO2 96%   BMI 31.14 kg/m   Initial impression: Hyperglycemia without DKA, nausea and vomiting.   11:41 PM Reassessment performed. Patient appears stable.  States  that he is feeling a lot better after IV fluids.  Labs personally reviewed and interpreted including: Hemoglobin A1c 12.8%; blood sugar down to the 280s after IV fluids.  Reviewed pertinent lab work and imaging with patient at bedside. Questions answered.   Most current vital signs reviewed and are as follows: BP (!) 150/68   Pulse 63   Temp 98.2 F (36.8 C) (Oral)   Resp 18   Ht 5\' 10"  (1.778 m)  Wt 98.4 kg   SpO2 97%   BMI 31.14 kg/m   Plan: Discharge to home.   Prescriptions written for: Metformin 500 mg twice daily  Other home care instructions discussed: Maintain good hydration, discussed cutting back to once a day metformin if GI upset or diarrhea is distressing.  Discussed that this usually improves as his body adjusts to the medication.  ED return instructions discussed: Worsening abdominal pain, persistent vomiting, severe diarrhea  Follow-up instructions discussed: Patient encouraged to follow-up with their PCP as soon as possible.  Family have obtained an appointment in 2 weeks.                           Medical Decision Making Amount and/or Complexity of Data Reviewed Labs: ordered.  Risk Prescription drug management.   Patient with GI symptoms, weight loss, dehydration.  Patient with new onset diabetes.  No evidence of DKA.  No significant focal abdominal tenderness on exam.  Patient was treated in the emergency department with IV fluids with improvement of blood sugar.  He is also feeling subjectively better.  He will be started on metformin.  No indications for admission today.  Family seems very supportive and have helped him established a PCP appointment upcoming in about 2 weeks.  The patient's vital signs, pertinent lab work and imaging were reviewed and interpreted as discussed in the ED course. Hospitalization was considered for further testing, treatments, or serial exams/observation. However as patient is well-appearing, has a stable exam, and  reassuring studies today, I do not feel that they warrant admission at this time. This plan was discussed with the patient who verbalizes agreement and comfort with this plan and seems reliable and able to return to the Emergency Department with worsening or changing symptoms.            Final Clinical Impression(s) / ED Diagnoses Final diagnoses:  Hyperglycemia  Type 2 diabetes mellitus without complication, without long-term current use of insulin (HCC)    Rx / DC Orders ED Discharge Orders          Ordered    metFORMIN (GLUCOPHAGE) 500 MG tablet  2 times daily with meals        08/15/21 2336              Renne Crigler, PA-C 08/15/21 2345    Edwin Dada P, DO 08/23/21 0001

## 2021-08-15 NOTE — ED Notes (Signed)
Lab called to add hgbA1c

## 2021-08-15 NOTE — ED Notes (Signed)
Pt tolerated fluid challenge. 

## 2021-08-15 NOTE — ED Notes (Signed)
Checked CBG 284, RN Ranette informed

## 2021-08-17 ENCOUNTER — Emergency Department (HOSPITAL_BASED_OUTPATIENT_CLINIC_OR_DEPARTMENT_OTHER)
Admission: EM | Admit: 2021-08-17 | Discharge: 2021-08-17 | Disposition: A | Discharge status: Home / Self Care | Payer: Medicare (Managed Care) | Attending: Emergency Medicine | Admitting: Emergency Medicine

## 2021-08-17 ENCOUNTER — Other Ambulatory Visit: Payer: Self-pay

## 2021-08-17 ENCOUNTER — Encounter (HOSPITAL_BASED_OUTPATIENT_CLINIC_OR_DEPARTMENT_OTHER): Payer: Self-pay | Admitting: Urology

## 2021-08-17 DIAGNOSIS — E1165 Type 2 diabetes mellitus with hyperglycemia: Secondary | ICD-10-CM | POA: Insufficient documentation

## 2021-08-17 DIAGNOSIS — I10 Essential (primary) hypertension: Secondary | ICD-10-CM | POA: Insufficient documentation

## 2021-08-17 DIAGNOSIS — Z8616 Personal history of COVID-19: Secondary | ICD-10-CM | POA: Diagnosis not present

## 2021-08-17 DIAGNOSIS — I25119 Atherosclerotic heart disease of native coronary artery with unspecified angina pectoris: Secondary | ICD-10-CM | POA: Diagnosis not present

## 2021-08-17 DIAGNOSIS — Z87891 Personal history of nicotine dependence: Secondary | ICD-10-CM | POA: Diagnosis not present

## 2021-08-17 DIAGNOSIS — I2510 Atherosclerotic heart disease of native coronary artery without angina pectoris: Secondary | ICD-10-CM | POA: Insufficient documentation

## 2021-08-17 DIAGNOSIS — I480 Paroxysmal atrial fibrillation: Secondary | ICD-10-CM | POA: Diagnosis not present

## 2021-08-17 DIAGNOSIS — Z9861 Coronary angioplasty status: Secondary | ICD-10-CM | POA: Diagnosis not present

## 2021-08-17 DIAGNOSIS — R739 Hyperglycemia, unspecified: Secondary | ICD-10-CM

## 2021-08-17 DIAGNOSIS — Z7984 Long term (current) use of oral hypoglycemic drugs: Secondary | ICD-10-CM | POA: Insufficient documentation

## 2021-08-17 DIAGNOSIS — I252 Old myocardial infarction: Secondary | ICD-10-CM | POA: Diagnosis not present

## 2021-08-17 DIAGNOSIS — Z86711 Personal history of pulmonary embolism: Secondary | ICD-10-CM | POA: Diagnosis not present

## 2021-08-17 DIAGNOSIS — E7800 Pure hypercholesterolemia, unspecified: Secondary | ICD-10-CM | POA: Diagnosis not present

## 2021-08-17 DIAGNOSIS — Z7982 Long term (current) use of aspirin: Secondary | ICD-10-CM | POA: Diagnosis not present

## 2021-08-17 DIAGNOSIS — Z79899 Other long term (current) drug therapy: Secondary | ICD-10-CM | POA: Diagnosis not present

## 2021-08-17 DIAGNOSIS — K219 Gastro-esophageal reflux disease without esophagitis: Secondary | ICD-10-CM | POA: Diagnosis not present

## 2021-08-17 LAB — CBC WITH DIFFERENTIAL/PLATELET
Abs Immature Granulocytes: 0.01 10*3/uL (ref 0.00–0.07)
Basophils Absolute: 0 10*3/uL (ref 0.0–0.1)
Basophils Relative: 0 %
Eosinophils Absolute: 0.1 10*3/uL (ref 0.0–0.5)
Eosinophils Relative: 2 %
HCT: 41.8 % (ref 39.0–52.0)
Hemoglobin: 14.7 g/dL (ref 13.0–17.0)
Immature Granulocytes: 0 %
Lymphocytes Relative: 25 %
Lymphs Abs: 1.4 10*3/uL (ref 0.7–4.0)
MCH: 28.1 pg (ref 26.0–34.0)
MCHC: 35.2 g/dL (ref 30.0–36.0)
MCV: 79.9 fL — ABNORMAL LOW (ref 80.0–100.0)
Monocytes Absolute: 0.5 10*3/uL (ref 0.1–1.0)
Monocytes Relative: 9 %
Neutro Abs: 3.5 10*3/uL (ref 1.7–7.7)
Neutrophils Relative %: 64 %
Platelets: 224 10*3/uL (ref 150–400)
RBC: 5.23 MIL/uL (ref 4.22–5.81)
RDW: 13.3 % (ref 11.5–15.5)
WBC: 5.5 10*3/uL (ref 4.0–10.5)
nRBC: 0 % (ref 0.0–0.2)

## 2021-08-17 LAB — BASIC METABOLIC PANEL
Anion gap: 10 (ref 5–15)
BUN: 20 mg/dL (ref 8–23)
CO2: 23 mmol/L (ref 22–32)
Calcium: 9 mg/dL (ref 8.9–10.3)
Chloride: 98 mmol/L (ref 98–111)
Creatinine, Ser: 0.97 mg/dL (ref 0.61–1.24)
GFR, Estimated: >60 mL/min (ref >60)
Glucose, Bld: 315 mg/dL — ABNORMAL HIGH (ref 70–99)
Potassium: 3.3 mmol/L — ABNORMAL LOW (ref 3.5–5.1)
Sodium: 131 mmol/L — ABNORMAL LOW (ref 135–145)

## 2021-08-17 LAB — CBG MONITORING, ED: Glucose-Capillary: 299 mg/dL — ABNORMAL HIGH (ref 70–99)

## 2021-08-17 MED ORDER — ONDANSETRON HCL 4 MG/2ML IJ SOLN
4.0000 mg | Freq: Once | INTRAMUSCULAR | Status: AC
  Administered 2021-08-17: 4 mg via INTRAVENOUS
  Filled 2021-08-17: qty 2

## 2021-08-17 MED ORDER — LACTATED RINGERS IV BOLUS
1000.0000 mL | Freq: Once | INTRAVENOUS | Status: AC
  Administered 2021-08-17: 1000 mL via INTRAVENOUS

## 2021-08-17 MED ORDER — ONDANSETRON HCL 4 MG PO TABS: 4.0000 mg | ORAL_TABLET | Freq: Four times a day (QID) | ORAL | 0 refills | Status: DC

## 2021-08-17 NOTE — ED Triage Notes (Signed)
Pt state blood sugar not going below 300 at home, was seen 2 days ago and dx with DM2, started on metformin Monday night Reports N/V and fatigue States has PCP appointment tomorrow for follow up

## 2021-08-17 NOTE — ED Provider Notes (Signed)
[facility] EMERGENCY DEPARTMENT Provider Note   CSN: [id number] Arrival date & time: 08/17/21  1633     History  Chief Complaint  Patient presents with   Hyperglycemia    Jeremy Ryan is a 66 y.o. male who with history of CAD status post stents, history of blood clots, hypertension and recently diagnosed with diabetes type 2 who presents to the ED for evaluation of poorly controlled blood sugars.  Patient was seen here at [facility] 2 days ago where he was diagnosed with type 2 diabetes after obtaining A1c of 12.8%.  He was sent home with metformin 500 mg to take twice daily, however patient and his wife state that he has been unable to keep his blood sugars under 300.  Most recently, sugars were 353.  Patient states he has been vomiting very frequently, he also has some overall fatigue and difficulty concentrating.  Patient also states that he was recently diagnosed with IBS and has been having some gastrointestinal problems secondary to that, although he denies abdominal pain.  Patient was previously seen at [facility], however he does have an appointment with a new PCP here at [facility] tomorrow for evaluation.  Patient denies chest pain, shortness of breath, abdominal pain, fevers, chills.   Hyperglycemia Associated symptoms: fatigue, nausea and vomiting   Associated symptoms: no abdominal pain, no chest pain and no shortness of breath        Home Medications Prior to Admission medications   Medication Sig Start Date End Date Taking? Authorizing Provider  ondansetron (ZOFRAN) 4 MG tablet Take 1 tablet (4 mg total) by mouth every 6 (six) hours. 08/17/21  Yes Curatolo, Adam, DO  diclofenac sodium (VOLTAREN) 1 % GEL Apply 4 g topically 4 (four) times daily. 12/19/17   Melene Plan, DO  metFORMIN (GLUCOPHAGE) 500 MG tablet Take 1 tablet (500 mg total) by mouth 2 (two) times daily with a meal. 08/15/21   Renne Crigler, PA-C  predniSONE (DELTASONE)  10 MG tablet Take 4 tablets (40 mg total) by mouth daily. 02/19/20   Sabino Donovan, MD      Allergies    Patient has no known allergies.    Review of Systems   Review of Systems  Constitutional:  Positive for fatigue.  Respiratory:  Negative for shortness of breath.   Cardiovascular:  Negative for chest pain.  Gastrointestinal:  Positive for nausea and vomiting. Negative for abdominal pain.    Physical Exam Updated Vital Signs BP (!) 147/61   Pulse 72   Temp 98.2 F (36.8 C) (Oral)   Resp 17   Ht 5\' 10"  (1.778 m)   Wt 98.4 kg   SpO2 96%   BMI 31.13 kg/m  Physical Exam Vitals and nursing note reviewed.  Constitutional:      General: He is not in acute distress.    Appearance: He is not ill-appearing.  HENT:     Head: Atraumatic.  Eyes:     Conjunctiva/sclera: Conjunctivae normal.  Cardiovascular:     Rate and Rhythm: Normal rate and regular rhythm.     Pulses: Normal pulses.          Radial pulses are 2+ on the right side and 2+ on the left side.       Dorsalis pedis pulses are 2+ on the right side and 2+ on the left side.     Heart sounds: No murmur heard. Pulmonary:     Effort:  Pulmonary effort is normal. No respiratory distress.     Breath sounds: Normal breath sounds.  Abdominal:     General: Abdomen is flat. There is no distension.     Palpations: Abdomen is soft.     Tenderness: There is no abdominal tenderness.  Musculoskeletal:        General: Normal range of motion.     Cervical back: Normal range of motion.     Right lower leg: No edema.     Left lower leg: No edema.  Skin:    General: Skin is warm and dry.     Capillary Refill: Capillary refill takes less than 2 seconds.     Comments: No wounds or ulcerations noted to the bilateral feet  Neurological:     General: No focal deficit present.     Mental Status: He is alert.  Psychiatric:        Mood and Affect: Mood normal.     ED Results / Procedures / Treatments   Labs (all labs ordered are  listed, but only abnormal results are displayed) Labs Reviewed  CBC WITH DIFFERENTIAL/PLATELET - Abnormal; Notable for the following components:      Result Value   MCV 79.9 (*)    All other components within normal limits  BASIC METABOLIC PANEL - Abnormal; Notable for the following components:   Sodium 131 (*)    Potassium 3.3 (*)    Glucose, Bld 315 (*)    All other components within normal limits  CBG MONITORING, ED - Abnormal; Notable for the following components:   Glucose-Capillary 299 (*)    All other components within normal limits    EKG None  Radiology No results found.  Procedures Procedures    Medications Ordered in ED Medications  lactated ringers bolus 1,000 mL (0 mLs Intravenous Stopped 08/17/21 1807)  ondansetron (ZOFRAN) injection 4 mg (4 mg Intravenous Given 08/17/21 1715)    ED Course/ Medical Decision Making/ A&P                           Medical Decision Making Amount and/or Complexity of Data Reviewed Labs: ordered.  Risk Prescription drug management.   Social determinants of health:  Social History   Socioeconomic History   Marital status: Married    Spouse name: Not on file   Number of children: Not on file   Years of education: Not on file   Highest education level: Not on file  Occupational History   Not on file  Tobacco Use   Smoking status: Former   Smokeless tobacco: Never  Vaping Use   Vaping Use: Never used  Substance and Sexual Activity   Alcohol use: Never   Drug use: Never   Sexual activity: Not on file  Other Topics Concern   Not on file  Social History Narrative   Not on file   Social Determinants of Health   Financial Resource Strain: Not on file  Food Insecurity: Not on file  Transportation Needs: Not on file  Physical Activity: Not on file  Stress: Not on file  Social Connections: Not on file  Intimate Partner Violence: Not on file     Initial impression:  This patient presents to the ED for concern  of hyperglycemia, this involves an extensive number of treatment options, and is a complaint that carries with it a high risk of complications and morbidity.   Differentials include hyperglycemia, DKA, HHS,  Comorbidities affecting care:  CAD, Type 2 DM  Additional history obtained: Wife, labs from 2 days ago  Lab Tests  I Ordered, reviewed, and interpreted labs and EKG.  The pertinent results include:  Glucose 299, normal gap and bicarb No leukocytosis  Medicines ordered and prescription drug management:  I ordered medication including: LR bolus Zofran 4mg  IV   Reevaluation of the patient after these medicines showed that the patient resolved I have reviewed the patients home medicines and have made adjustments as needed   ED Course/Re-evaluation: Presents in no acute distress and is nontoxic appearing. Vitals normal. Physical exam overall benign.  Abdomen is soft, distended and nontender to palpation.  Lungs CTA bilaterally.  He is speaking in full and complete sentences.  Labs were very reassuring.  Notable for glucose of 315 without evidence of DKA.  He was given 1 L fluids along with Zofran and patient states that he was feeling much better.  We will plan to send home with Zofran to use as needed. Discussed labs with patient and family and reassured them that he is not currently in diabetic emergency and can follow up with at his PCP appt tomorrow for medication changes. Patient is amenable to this plan but conversation with family was nonproductive. They state that they have shown labs to family friends who are nurses who have told them that patient must be admitted d/t labs and kidney function. Kidney function normal on labs today and two days ago. My attending Dr spoke with them to again reassure them that patient was not in diabetic emergency. Patient was ultimately discharged home to follow up with PCP tomorrow.   Disposition:  After consideration of the diagnostic  results, physical exam, history and the patients response to treatment feel that the patent would benefit from discharge.   Hyperglycemia: Plan and management as described above. Discharged home in good condition.  Final Clinical Impression(s) / ED Diagnoses Final diagnoses:  Hyperglycemia    Rx / DC Orders ED Discharge Orders          Ordered    ondansetron (ZOFRAN) 4 MG tablet  Every 6 hours        08/17/21 1821              08/19/21, PA-C 08/17/21 1900    08/19/21, DO 08/17/21 2148

## 2021-08-17 NOTE — Discharge Instructions (Addendum)
Continue to take your metformin.  Focus on hydrating with water and eliminate all sugary drinks as discussed.  Your lab work today does not indicate that you are in a diabetic emergency.    If your blood sugars at home are greater than 600 please return for evaluation.    Please discuss further diabetes medications with your primary care doctor tomorrow.

## 2021-08-17 NOTE — ED Notes (Signed)
Pt tolerating po intake at this time.  

## 2021-08-17 NOTE — ED Provider Notes (Addendum)
Shared visit.  Patient here for high blood sugar.  Diagnosed with diabetes several days ago.  Has been on metformin for 3 days.  Family concerned because blood sugars still have been in the 300s.  He has follow-up with primary care doctor tomorrow.  He has had some weight loss, fatigue last several weeks which I suspect is from his diabetes.  Blood work has already been collected prior to my evaluation.  Lab work is overall reassuring.  He is not in DKA.  His kidney functions normal.  Blood sugar after some IV fluids to 99.  Bicarb is normal.  He has no significant leukocytosis or electrolyte abnormality otherwise.  Overall, his A1c from several days ago was in the 12 range.  Suspect that he will get some medication adjustment with his primary care doctor tomorrow.  He has unremarkable vitals.  He is well-appearing.  I did educate him about dietary modifications and exercise.  Family is very concerned about discharge given his high blood sugar but ultimately patient's not in DKA.  He does not appear to have any other emergent process going on at this time.  Overall I had unproductive conversations with the family about his management but at this time I think he is safe for discharge and follow-up with his primary care doctor tomorrow as scheduled.  I recommended that if his blood sugar was consistently above undetectable level/600 level they should be reevaluated at that time.  Patient was able to drink.  This chart was dictated using voice recognition software.  Despite best efforts to proofread,  errors can occur which can change the documentation meaning.    Virgina Norfolk, DO 08/17/21 1824    Virgina Norfolk, DO 08/17/21 1835    Virgina Norfolk, DO 08/17/21 1836

## 2021-08-17 NOTE — ED Notes (Signed)
Pt given diet ginger ale per request

## 2021-08-17 NOTE — ED Notes (Addendum)
Pt provided discharge instructions with family at bedside. All questions/concerns addressed at time of discharge. Family provided with printed lab results per request. Also informed to access this information on pts mychart. Family verbalized understanding. Pt ambulatory to ED exit. NAD.

## 2021-08-18 ENCOUNTER — Encounter: Payer: Self-pay | Admitting: Family

## 2021-08-18 ENCOUNTER — Other Ambulatory Visit: Payer: Self-pay | Admitting: Family

## 2021-08-18 ENCOUNTER — Ambulatory Visit (INDEPENDENT_AMBULATORY_CARE_PROVIDER_SITE_OTHER): Payer: Medicare (Managed Care) | Admitting: Family

## 2021-08-18 VITALS — BP 118/78 | HR 68 | Temp 98.2°F | Resp 18 | Ht 70.0 in | Wt 219.6 lb

## 2021-08-18 DIAGNOSIS — E785 Hyperlipidemia, unspecified: Secondary | ICD-10-CM | POA: Diagnosis not present

## 2021-08-18 DIAGNOSIS — G250 Essential tremor: Secondary | ICD-10-CM

## 2021-08-18 DIAGNOSIS — E119 Type 2 diabetes mellitus without complications: Secondary | ICD-10-CM | POA: Insufficient documentation

## 2021-08-18 DIAGNOSIS — I10 Essential (primary) hypertension: Secondary | ICD-10-CM

## 2021-08-18 MED ORDER — INSULIN PEN NEEDLE 32G X 6 MM MISC: 0 refills | Status: DC

## 2021-08-18 MED ORDER — LANTUS SOLOSTAR 100 UNIT/ML ~~LOC~~ SOPN: PEN_INJECTOR | SUBCUTANEOUS | 1 refills | Status: DC

## 2021-08-18 MED ORDER — LOSARTAN POTASSIUM 50 MG PO TABS
50.0000 mg | ORAL_TABLET | Freq: Every day | ORAL | Status: DC
Start: 2021-08-18 — End: 2021-09-12

## 2021-08-18 NOTE — Progress Notes (Signed)
Jeremy Ryan is a 66 y.o. male with the following history as recorded in EpicCare:  Patient Active Problem List   Diagnosis Date Noted   Type 2 diabetes mellitus without complication, without long-term current use of insulin ([place]) 08/18/2021   Angina pectoris ([place]) 12/28/2020   CAD in native artery 02/17/2020   Dyslipidemia 02/17/2020   Essential hypertension 03/11/2013   Gastro-esophageal reflux disease without esophagitis 03/11/2013   Paroxysmal atrial fibrillation ([place]) 03/11/2013   Benign localized prostatic hyperplasia without lower urinary tract symptoms (LUTS) 11/29/2012    Current Outpatient Medications  Medication Sig Dispense Refill   aspirin EC 81 MG tablet Take 81 mg by mouth daily. Swallow whole.     atorvastatin (LIPITOR) 80 MG tablet Take 1 tablet by mouth daily.     busPIRone (BUSPAR) 5 MG tablet Take 5 mg by mouth 2 (two) times daily.     busPIRone (BUSPAR) 7.5 MG tablet Take by mouth.     clopidogrel (PLAVIX) 75 MG tablet Take 75 mg by mouth daily.     dicyclomine (BENTYL) 20 MG tablet Take 20 mg by mouth 4 (four) times daily.     furosemide (LASIX) 20 MG tablet SMARTSIG:1-2 Tablet(s) By Mouth     insulin glargine (LANTUS SOLOSTAR) 100 UNIT/ML Solostar Pen Inject 10 units nightly as directed; increase by 2 units every 2 days as directed 15 mL 1   Insulin Pen Needle 32G X 6 MM MISC Inject as directed 100 each 0   isosorbide mononitrate (IMDUR) 30 MG 24 hr tablet Take 30 mg by mouth 2 (two) times daily.     metFORMIN (GLUCOPHAGE) 500 MG tablet Take 1 tablet (500 mg total) by mouth 2 (two) times daily with a meal. 60 tablet 0   pantoprazole (PROTONIX) 40 MG tablet Take 1 tablet by mouth daily.     propranolol (INDERAL) 40 MG tablet Take 40 mg by mouth 3 (three) times daily.     losartan (COZAAR) 50 MG tablet Take 1 tablet (50 mg total) by mouth daily.     No current facility-administered medications for this visit.    Allergies: Patient has no known allergies.  Past  Medical History:  Diagnosis Date   Colon polyps    Coronary artery disease    History of blood clots    Hypertension     Past Surgical History:  Procedure Laterality Date   CORONARY STENT INTERVENTION      No family history on file.  Social History   Tobacco Use   Smoking status: Former   Smokeless tobacco: Never  Substance Use Topics   Alcohol use: Never    Subjective:  Accompanied by daughter; is a new patient here today; Had CPE with PCP at [facility] approximately 1 month ago and was told he was new onset diabetic; however, notes no treatment was offered or discussed at that time; At follow up visit on Monday with PCP, notes he was very dehydrated and was sent to ER for fluids; Glucose at ER on Monday was 441; started on Metformin; Hgba1c at 12.5;  Family was concerned for possible need for hospitalization due to continued sense of not feeling well/ frequent urination- went to 2 different ERs yesterday; blood sugar yesterday was 284 and family reassured that no hospitalization was necessary;      Objective:  Vitals:   08/18/21 0927  BP: 118/78  Pulse: 68  Resp: 18  Temp: 98.2 F (36.8 C)  TempSrc: Oral  SpO2: 96%  Weight: 219 lb 9.6 oz (99.6 kg)  Height: 5' 10"  (1.778 m)    General: Well developed, well nourished, in no acute distress  Skin : Warm and dry.  Head: Normocephalic and atraumatic  Eyes: Sclera and conjunctiva clear; pupils round and reactive to light; extraocular movements intact  Ears: External normal; canals clear; tympanic membranes normal  Oropharynx: Pink, supple. No suspicious lesions  Neck: Supple without thyromegaly, adenopathy  Lungs: Respirations unlabored; clear to auscultation bilaterally without wheeze, rales, rhonchi  CVS exam: normal rate and regular rhythm.  Neurologic: Alert and oriented; speech intact; face symmetrical; moves all extremities well; CNII-XII intact without focal deficit   Assessment:  1. Type 2 diabetes  mellitus without complication, without long-term current use of insulin ([place])   2. Essential hypertension   3. Dyslipidemia   4. Tremor, essential     Plan:  Will start basal insulin at 10 units daily- daughter who is a nurse understands directions to increase units by 2 units every 2 days until fasting glucose goal met ( would like 125-130 for now); stay on Metformin but may need to discontinue if GI side effects persist; based on patient and family preferences, will refer to endocrinology to establish care as well; Extensive medication review done with patient and family; he understands he should not be taking both Lasix and HCTZ; will d/c HCTZ; follow up in 2 weeks; Continue same medicaiton; Per patient, diagnosed "years ago" and was started on Propanolol to manage;   Spent 40 minutes with patient and daughter- extensive medication review done; they will be getting pill box to help patient; he will get copies of recent labs done at his most recent CPE- need to see what Hgba1c was last month.   Return in about 2 weeks (around 09/01/2021).  Orders Placed This Encounter  Procedures   Ambulatory referral to Endocrinology    Referral Priority:   Routine    Referral Type:   Consultation    Referral Reason:   Specialty Services Required    Number of Visits Requested:   1    Requested Prescriptions   Signed Prescriptions Disp Refills   insulin glargine (LANTUS SOLOSTAR) 100 UNIT/ML Solostar Pen 15 mL 1    Sig: Inject 10 units nightly as directed; increase by 2 units every 2 days as directed   Insulin Pen Needle 32G X 6 MM MISC 100 each 0    Sig: Inject as directed   losartan (COZAAR) 50 MG tablet      Sig: Take 1 tablet (50 mg total) by mouth daily.

## 2021-08-19 ENCOUNTER — Other Ambulatory Visit: Payer: Self-pay

## 2021-08-19 ENCOUNTER — Telehealth: Payer: Self-pay | Admitting: Family

## 2021-08-19 DIAGNOSIS — E119 Type 2 diabetes mellitus without complications: Secondary | ICD-10-CM

## 2021-08-19 MED ORDER — INSULIN PEN NEEDLE 32G X 6 MM MISC: 0 refills | Status: DC

## 2021-08-19 NOTE — Telephone Encounter (Signed)
Sent in the needles

## 2021-08-19 NOTE — Telephone Encounter (Signed)
Pt is needing needles to go with his insulin. They do not have an order at the pharmacy.   [id number] [name], [place] - [address]   [address] [place], [place] [address]  Phone:  [phone]  Fax:  [phone]

## 2021-08-31 ENCOUNTER — Ambulatory Visit: Payer: Medicare (Managed Care) | Admitting: Medical

## 2021-09-01 ENCOUNTER — Ambulatory Visit (INDEPENDENT_AMBULATORY_CARE_PROVIDER_SITE_OTHER): Payer: Medicare (Managed Care) | Admitting: Family

## 2021-09-01 ENCOUNTER — Encounter: Payer: Self-pay | Admitting: Family

## 2021-09-01 VITALS — BP 108/60 | HR 60 | Resp 20 | Ht 70.0 in | Wt 222.0 lb

## 2021-09-01 DIAGNOSIS — E119 Type 2 diabetes mellitus without complications: Secondary | ICD-10-CM

## 2021-09-01 LAB — CBC WITH DIFFERENTIAL/PLATELET
Basophils Absolute: 0 10*3/uL (ref 0.0–0.1)
Basophils Relative: 0.4 % (ref 0.0–3.0)
Eosinophils Absolute: 0.1 10*3/uL (ref 0.0–0.7)
Eosinophils Relative: 1.8 % (ref 0.0–5.0)
HCT: 38.4 % — ABNORMAL LOW (ref 39.0–52.0)
Hemoglobin: 12.8 g/dL — ABNORMAL LOW (ref 13.0–17.0)
Lymphocytes Relative: 25.7 % (ref 12.0–46.0)
Lymphs Abs: 1.7 10*3/uL (ref 0.7–4.0)
MCHC: 33.4 g/dL (ref 30.0–36.0)
MCV: 82.1 fl (ref 78.0–100.0)
Monocytes Absolute: 0.6 10*3/uL (ref 0.1–1.0)
Monocytes Relative: 8.9 % (ref 3.0–12.0)
Neutro Abs: 4.3 10*3/uL (ref 1.4–7.7)
Neutrophils Relative %: 63.2 % (ref 43.0–77.0)
Platelets: 231 10*3/uL (ref 150.0–400.0)
RBC: 4.67 Mil/uL (ref 4.22–5.81)
RDW: 14.5 % (ref 11.5–15.5)
WBC: 6.7 10*3/uL (ref 4.0–10.5)

## 2021-09-01 LAB — COMPREHENSIVE METABOLIC PANEL
ALT: 43 U/L (ref 0–53)
AST: 35 U/L (ref 0–37)
Albumin: 4.1 g/dL (ref 3.5–5.2)
Alkaline Phosphatase: 100 U/L (ref 39–117)
BUN: 17 mg/dL (ref 6–23)
CO2: 31 mEq/L (ref 19–32)
Calcium: 9.4 mg/dL (ref 8.4–10.5)
Chloride: 104 mEq/L (ref 96–112)
Creatinine, Ser: 0.92 mg/dL (ref 0.40–1.50)
GFR: 85.7 mL/min (ref >60.00)
Glucose, Bld: 66 mg/dL — ABNORMAL LOW (ref 70–99)
Potassium: 4.4 mEq/L (ref 3.5–5.1)
Sodium: 141 mEq/L (ref 135–145)
Total Bilirubin: 0.7 mg/dL (ref 0.2–1.2)
Total Protein: 6.5 g/dL (ref 6.0–8.3)

## 2021-09-01 NOTE — Progress Notes (Signed)
Jeremy Ryan is a 66 y.o. male with the following history as recorded in EpicCare:  Patient Active Problem List   Diagnosis Date Noted   Type 2 diabetes mellitus without complication, without long-term current use of insulin ([place]) 08/18/2021   Angina pectoris ([place]) 12/28/2020   CAD in native artery 02/17/2020   Dyslipidemia 02/17/2020   Essential hypertension 03/11/2013   Gastro-esophageal reflux disease without esophagitis 03/11/2013   Paroxysmal atrial fibrillation ([place]) 03/11/2013   Benign localized prostatic hyperplasia without lower urinary tract symptoms (LUTS) 11/29/2012    Current Outpatient Medications  Medication Sig Dispense Refill   aspirin EC 81 MG tablet Take 81 mg by mouth daily. Swallow whole.     atorvastatin (LIPITOR) 80 MG tablet Take 1 tablet by mouth daily.     busPIRone (BUSPAR) 5 MG tablet Take 5 mg by mouth 2 (two) times daily.     busPIRone (BUSPAR) 7.5 MG tablet Take by mouth.     clopidogrel (PLAVIX) 75 MG tablet Take 75 mg by mouth daily.     dicyclomine (BENTYL) 20 MG tablet Take 20 mg by mouth 4 (four) times daily.     furosemide (LASIX) 20 MG tablet SMARTSIG:1-2 Tablet(s) By Mouth     insulin glargine (LANTUS SOLOSTAR) 100 UNIT/ML Solostar Pen Inject 10 units nightly as directed; increase by 2 units every 2 days as directed 15 mL 1   Insulin Pen Needle 32G X 6 MM MISC Inject as directed 100 each 0   isosorbide mononitrate (IMDUR) 30 MG 24 hr tablet Take 30 mg by mouth 2 (two) times daily.     losartan (COZAAR) 50 MG tablet Take 1 tablet (50 mg total) by mouth daily.     metFORMIN (GLUCOPHAGE) 500 MG tablet Take 1 tablet (500 mg total) by mouth 2 (two) times daily with a meal. 60 tablet 0   pantoprazole (PROTONIX) 40 MG tablet Take 1 tablet by mouth daily.     propranolol (INDERAL) 40 MG tablet Take 40 mg by mouth 3 (three) times daily.     No current facility-administered medications for this visit.    Allergies: Patient has no known allergies.  Past  Medical History:  Diagnosis Date   Colon polyps    Coronary artery disease    History of blood clots    Hypertension     Past Surgical History:  Procedure Laterality Date   CORONARY STENT INTERVENTION      History reviewed. No pertinent family history.  Social History   Tobacco Use   Smoking status: Former   Smokeless tobacco: Never  Substance Use Topics   Alcohol use: Never    Subjective:   Patient presents with daughter for 2 week follow up; has been doing much better since getting on insulin; currently taking 20 units of Lantus; fasting blood sugar in the 115-125 range; feeling better except continuing to have vision issues- feels like his trifocals need to be corrected;  Is scheduled to see endocrinology in about 2 weeks;  Has been able to get a pill box at home and family is helping to organize his medications;     Objective:  Vitals:   09/01/21 0936  BP: 108/60  Pulse: 60  Resp: 20  SpO2: 95%  Weight: 222 lb (100.7 kg)  Height: 5' 10"  (1.778 m)    General: Well developed, well nourished, in no acute distress  Skin : Warm and dry.  Head: Normocephalic and atraumatic  Lungs: Respirations unlabored; clear to auscultation bilaterally  without wheeze, rales, rhonchi  CVS exam: normal rate and regular rhythm.  Neurologic: Alert and oriented; speech intact; face symmetrical; moves all extremities well; CNII-XII intact without focal deficit   Assessment:  1. Type 2 diabetes mellitus without complication, without long-term current use of insulin ([facility])     Plan:  Improving- responding well to Lantus; check CBC, CMP today; keep planned follow up with endocrinology; encouraged to see his eye doctor with continued vision issues and he agrees;  Follow up to be determined.   No follow-ups on file.  Orders Placed This Encounter  Procedures   CBC with Differential/Platelet   Comp Met (CMET)    Requested Prescriptions    No prescriptions requested or ordered in this  encounter

## 2021-09-09 DIAGNOSIS — E119 Type 2 diabetes mellitus without complications: Secondary | ICD-10-CM | POA: Diagnosis not present

## 2021-09-09 DIAGNOSIS — H2513 Age-related nuclear cataract, bilateral: Secondary | ICD-10-CM | POA: Diagnosis not present

## 2021-09-12 ENCOUNTER — Other Ambulatory Visit: Payer: Self-pay | Admitting: Family

## 2021-09-12 NOTE — Telephone Encounter (Signed)
Medication: losartan (COZAAR) 50 MG tablet [[id number]]  metFORMIN (GLUCOPHAGE) 500 MG tablet [[id number]]   propranolol (INDERAL) 40 MG tablet [[id number]]   pantoprazole (PROTONIX) 40 MG tablet [[id number]]   Has the patient contacted their pharmacy? Yes.     Preferred Pharmacy (with phone number or street name): [id number] [place], [place] - [address]  [address] [place] [address]  Phone:  [phone]  Fax:  [phone]   Agent: Please be advised that RX refills may take up to 3 business days. We ask that you follow-up with your pharmacy.

## 2021-09-12 NOTE — Telephone Encounter (Signed)
Okay to refill medications that are pended?   08/18/21 Pt established 09/01/21 last OV

## 2021-09-13 MED ORDER — PROPRANOLOL HCL 40 MG PO TABS: 40.0000 mg | ORAL_TABLET | Freq: Three times a day (TID) | ORAL | 2 refills | Status: DC

## 2021-09-13 MED ORDER — LOSARTAN POTASSIUM 50 MG PO TABS: 50.0000 mg | ORAL_TABLET | Freq: Every day | ORAL | 2 refills | Status: DC

## 2021-09-13 MED ORDER — METFORMIN HCL 500 MG PO TABS: 500.0000 mg | ORAL_TABLET | Freq: Two times a day (BID) | ORAL | 2 refills | Status: DC

## 2021-09-13 MED ORDER — PANTOPRAZOLE SODIUM 40 MG PO TBEC: 40.0000 mg | DELAYED_RELEASE_TABLET | Freq: Every day | ORAL | 2 refills | Status: DC

## 2021-09-13 NOTE — Telephone Encounter (Signed)
I have called the pt and clarified that his still talking both the metformin and the insulin at night. His fasting reading are around 140-150 in the am and 98 by lunch fasting.   He stated that he has gotten used to the medication that his doesn't have the GI side effects lately. He does have an appointment with ENDO coming up and he was told to continue his medications until he is seen by them.   Please assist with the rx refills.

## 2021-09-30 ENCOUNTER — Ambulatory Visit: Payer: Medicare (Managed Care) | Admitting: Family

## 2021-09-30 ENCOUNTER — Encounter: Payer: Self-pay | Admitting: Family

## 2021-09-30 VITALS — BP 128/60 | HR 58 | Temp 98.0°F | Ht 70.0 in | Wt 215.2 lb

## 2021-09-30 DIAGNOSIS — R1310 Dysphagia, unspecified: Secondary | ICD-10-CM | POA: Diagnosis not present

## 2021-09-30 DIAGNOSIS — R42 Dizziness and giddiness: Secondary | ICD-10-CM | POA: Diagnosis not present

## 2021-09-30 DIAGNOSIS — E119 Type 2 diabetes mellitus without complications: Secondary | ICD-10-CM | POA: Diagnosis not present

## 2021-09-30 MED ORDER — FLUTICASONE PROPIONATE 50 MCG/ACT NA SUSP: 2.0000 | Freq: Every day | NASAL | 6 refills | Status: DC

## 2021-09-30 NOTE — Progress Notes (Signed)
Jeremy Ryan is a 66 y.o. male with the following history as recorded in [employer]:  Patient Active Problem List   Diagnosis Date Noted   Type 2 diabetes mellitus without complication, without long-term current use of insulin (HCC) 08/18/2021   Angina pectoris (HCC) 12/28/2020   CAD in native artery 02/17/2020   Dyslipidemia 02/17/2020   Essential hypertension 03/11/2013   Gastro-esophageal reflux disease without esophagitis 03/11/2013   Paroxysmal atrial fibrillation (HCC) 03/11/2013   Benign localized prostatic hyperplasia without lower urinary tract symptoms (LUTS) 11/29/2012    Current Outpatient Medications  Medication Sig Dispense Refill   aspirin EC 81 MG tablet Take 81 mg by mouth daily. Swallow whole.     atorvastatin (LIPITOR) 80 MG tablet Take 1 tablet by mouth daily.     busPIRone (BUSPAR) 5 MG tablet Take 5 mg by mouth 2 (two) times daily.     busPIRone (BUSPAR) 7.5 MG tablet Take by mouth.     clopidogrel (PLAVIX) 75 MG tablet Take 75 mg by mouth daily.     dicyclomine (BENTYL) 20 MG tablet Take 20 mg by mouth 4 (four) times daily.     fluticasone (FLONASE) 50 MCG/ACT nasal spray Place 2 sprays into both nostrils daily. 16 g 6   furosemide (LASIX) 20 MG tablet SMARTSIG:1-2 Tablet(s) By Mouth     insulin glargine (LANTUS SOLOSTAR) 100 UNIT/ML Solostar Pen Inject 10 units nightly as directed; increase by 2 units every 2 days as directed 15 mL 1   Insulin Pen Needle 32G X 6 MM MISC Inject as directed 100 each 0   isosorbide mononitrate (IMDUR) 30 MG 24 hr tablet Take 30 mg by mouth 2 (two) times daily.     losartan (COZAAR) 50 MG tablet Take 1 tablet (50 mg total) by mouth daily. 30 tablet 2   metFORMIN (GLUCOPHAGE) 500 MG tablet Take 1 tablet (500 mg total) by mouth 2 (two) times daily with a meal. 60 tablet 2   pantoprazole (PROTONIX) 40 MG tablet Take 1 tablet (40 mg total) by mouth daily. 30 tablet 2   propranolol (INDERAL) 40 MG tablet Take 1 tablet (40 mg total) by  mouth 3 (three) times daily. 90 tablet 2   No current facility-administered medications for this visit.    Allergies: Patient has no known allergies.  Past Medical History:  Diagnosis Date   Colon polyps    Coronary artery disease    History of blood clots    Hypertension     Past Surgical History:  Procedure Laterality Date   CORONARY STENT INTERVENTION      No family history on file.  Social History   Tobacco Use   Smoking status: Former   Smokeless tobacco: Never  Substance Use Topics   Alcohol use: Never    Subjective:  Has been having difficulty swallowing x 3-4 weeks; does feel like food is occasionally "getting stuck." Admits is not taking Protonix regularly;  Also complaining of feeling dizzy with positional changes- most noticeable with bending over or getting up from seated position; no history of allergic rhinitis;    Objective:  Vitals:   09/30/21 1015  BP: 128/60  Pulse: (!) 58  Temp: 98 F (36.7 C)  TempSrc: Oral  SpO2: 97%  Weight: 215 lb 3.2 oz (97.6 kg)  Height: 5\' 10"  (1.778 m)    General: Well developed, well nourished, in no acute distress  Skin : Warm and dry.  Head: Normocephalic and atraumatic  Eyes: Sclera and  conjunctiva clear; pupils round and reactive to light; extraocular movements intact  Ears: External normal; canals clear; tympanic membranes congested bilaterally Oropharynx: Pink, supple. No suspicious lesions  Neck: Supple without thyromegaly, adenopathy  Lungs: Respirations unlabored; clear to auscultation bilaterally without wheeze, rales, rhonchi  CVS exam: normal rate and regular rhythm.  Neurologic: Alert and oriented; speech intact; face symmetrical; moves all extremities well; CNII-XII intact without focal deficit   Assessment:  1. Dysphagia, unspecified type   2. Dizziness   3. Orthostatic dizziness   4. Type 2 diabetes mellitus without complication, without long-term current use of insulin (HCC)     Plan:  Suspect  uncontrolled GERD- not taking his Protonix; encouraged to take daily; will refer back to [name] since he feels that food is getting stuck; ? ETD- trial of Flonase NS; will refer him back to cardiology due to history of positional changes- he was due for follow up there in June;  4.   Has established with endocrine- pleased with treatment response and will continue on both Metformin and insulin for now.   Time spent 30 + minutes    No follow-ups on file.  Orders Placed This Encounter  Procedures   Ambulatory referral to Gastroenterology    Referral Priority:   Routine    Referral Type:   Consultation    Referral Reason:   Specialty Services Required    Referred to Provider:   [name], MD    Number of Visits Requested:   1   Ambulatory referral to Cardiology    Referral Priority:   Routine    Referral Type:   Consultation    Referral Reason:   Specialty Services Required    Requested Specialty:   Cardiology    Number of Visits Requested:   1    Requested Prescriptions   Signed Prescriptions Disp Refills   fluticasone (FLONASE) 50 MCG/ACT nasal spray 16 g 6    Sig: Place 2 sprays into both nostrils daily.

## 2021-09-30 NOTE — Patient Instructions (Addendum)
Please re-start Protonix and take daily to help with the reflux/ difficulty swallowing; they will contact you to schedule;    Eustachian Tube Dysfunction  Eustachian tube dysfunction refers to a condition in which a blockage develops in the narrow passage that connects the middle ear to the back of the nose (eustachian tube). The eustachian tube regulates air pressure in the middle ear by letting air move between the ear and nose. It also helps to drain fluid from the middle ear space. Eustachian tube dysfunction can affect one or both ears. When the eustachian tube does not function properly, air pressure, fluid, or both can build up in the middle ear. What are the causes? This condition occurs when the eustachian tube becomes blocked or cannot open normally. Common causes of this condition include: Ear infections. Colds and other infections that affect the nose, mouth, and throat (upper respiratory tract). Allergies. Irritation from cigarette smoke. Irritation from stomach acid coming up into the esophagus (gastroesophageal reflux). The esophagus is the part of the body that moves food from the mouth to the stomach. Sudden changes in air pressure, such as from descending in an airplane or scuba diving. Abnormal growths in the nose or throat, such as: Growths that line the nose (nasal polyps). Abnormal growth of cells (tumors). Enlarged tissue at the back of the throat (adenoids). What increases the risk? You are more likely to develop this condition if: You smoke. You are overweight. You are a child who has: Certain birth defects of the mouth, such as cleft palate. Large tonsils or adenoids. What are the signs or symptoms? Common symptoms of this condition include: A feeling of fullness in the ear. Ear pain. Clicking or popping noises in the ear. Ringing in the ear (tinnitus). Hearing loss. Loss of balance. Dizziness. Symptoms may get worse when the air pressure around you  changes, such as when you travel to an area of high elevation, fly on an airplane, or go scuba diving. How is this diagnosed? This condition may be diagnosed based on: Your symptoms. A physical exam of your ears, nose, and throat. Tests, such as those that measure: The movement of your eardrum. Your hearing (audiometry). How is this treated? Treatment depends on the cause and severity of your condition. In mild cases, you may relieve your symptoms by moving air into your ears. This is called "popping the ears." In more severe cases, or if you have symptoms of fluid in your ears, treatment may include: Medicines to relieve congestion (decongestants). Medicines that treat allergies (antihistamines). Nasal sprays or ear drops that contain medicines that reduce swelling (steroids). A procedure to drain the fluid in your eardrum. In this procedure, a small tube may be placed in the eardrum to: Drain the fluid. Restore the air in the middle ear space. A procedure to insert a balloon device through the nose to inflate the opening of the eustachian tube (balloon dilation). Follow these instructions at home: Lifestyle Do not do any of the following until your health care provider approves: Travel to high altitudes. Fly in airplanes. Work in a Estate agent or room. Scuba dive. Do not use any products that contain nicotine or tobacco. These products include cigarettes, chewing tobacco, and vaping devices, such as e-cigarettes. If you need help quitting, ask your health care provider. Keep your ears dry. Wear fitted earplugs during showering and bathing. Dry your ears completely after. General instructions Take over-the-counter and prescription medicines only as told by your health care provider. Use  techniques to help pop your ears as recommended by your health care provider. These may include: Chewing gum. Yawning. Frequent, forceful swallowing. Closing your mouth, holding your nose  closed, and gently blowing as if you are trying to blow air out of your nose. Keep all follow-up visits. This is important. Contact a health care provider if: Your symptoms do not go away after treatment. Your symptoms come back after treatment. You are unable to pop your ears. You have: A fever. Pain in your ear. Pain in your head or neck. Fluid draining from your ear. Your hearing suddenly changes. You become very dizzy. You lose your balance. Get help right away if: You have a sudden, severe increase in any of your symptoms. Summary Eustachian tube dysfunction refers to a condition in which a blockage develops in the eustachian tube. It can be caused by ear infections, allergies, inhaled irritants, or abnormal growths in the nose or throat. Symptoms may include ear pain or fullness, hearing loss, or ringing in the ears. Mild cases are treated with techniques to unblock the ears, such as yawning or chewing gum. More severe cases are treated with medicines or procedures. This information is not intended to replace advice given to you by your health care provider. Make sure you discuss any questions you have with your health care provider. Document Revised: 03/29/2020 Document Reviewed: 03/29/2020 Elsevier Patient Education  2023 [employer].

## 2021-10-19 DIAGNOSIS — R1310 Dysphagia, unspecified: Secondary | ICD-10-CM | POA: Diagnosis not present

## 2021-11-02 DIAGNOSIS — R42 Dizziness and giddiness: Secondary | ICD-10-CM | POA: Insufficient documentation

## 2021-11-04 ENCOUNTER — Ambulatory Visit (INDEPENDENT_AMBULATORY_CARE_PROVIDER_SITE_OTHER): Payer: Medicare (Managed Care) | Admitting: *Deleted

## 2021-11-04 DIAGNOSIS — Z0000 Encounter for general adult medical examination without abnormal findings: Secondary | ICD-10-CM | POA: Diagnosis not present

## 2021-11-04 NOTE — Progress Notes (Addendum)
Subjective:   Jeremy LentSteve W Ryan is a 66 y.o. male who presents for Medicare Annual/Subsequent preventive examination.  I connected with  Jeremy LentSteve W Mcginn on 11/04/21 by a audio enabled telemedicine application and verified that I am speaking with the correct person using two identifiers.  Patient Location: Home  Provider Location: Office/Clinic  I discussed the limitations of evaluation and management by telemedicine. The patient expressed understanding and agreed to proceed.   Review of Systems    Defer to PCP Cardiac Risk Factors include: advanced age (>6155men, 2>65 women);diabetes mellitus;dyslipidemia;male gender;hypertension     Objective:    There were no vitals filed for this visit. There is no height or weight on file to calculate BMI.     11/04/2021    3:11 PM 08/17/2021    4:46 PM 08/15/2021    7:22 PM 06/07/2020   12:10 PM 02/19/2020    7:57 AM 12/19/2017   11:21 AM  Advanced Directives  Does Patient Have a Medical Advance Directive? No No No No No No  Would patient like information on creating a medical advance directive? No - Patient declined         Current Medications (verified) Outpatient Encounter Medications as of 11/04/2021  Medication Sig   aspirin EC 81 MG tablet Take 81 mg by mouth daily. Swallow whole.   atorvastatin (LIPITOR) 80 MG tablet Take 1 tablet by mouth daily.   busPIRone (BUSPAR) 5 MG tablet Take 5 mg by mouth 2 (two) times daily.   busPIRone (BUSPAR) 7.5 MG tablet Take by mouth.   clopidogrel (PLAVIX) 75 MG tablet Take 75 mg by mouth daily.   dicyclomine (BENTYL) 20 MG tablet Take 20 mg by mouth 4 (four) times daily.   furosemide (LASIX) 20 MG tablet SMARTSIG:1-2 Tablet(s) By Mouth   isosorbide mononitrate (IMDUR) 30 MG 24 hr tablet Take 30 mg by mouth 2 (two) times daily.   JARDIANCE 25 MG TABS tablet Take 25 mg by mouth daily.   losartan (COZAAR) 50 MG tablet Take 1 tablet (50 mg total) by mouth daily.   pantoprazole (PROTONIX) 40 MG tablet Take  1 tablet (40 mg total) by mouth daily.   propranolol (INDERAL) 40 MG tablet Take 1 tablet (40 mg total) by mouth 3 (three) times daily.   [DISCONTINUED] fluticasone (FLONASE) 50 MCG/ACT nasal spray Place 2 sprays into both nostrils daily.   [DISCONTINUED] insulin glargine (LANTUS SOLOSTAR) 100 UNIT/ML Solostar Pen Inject 10 units nightly as directed; increase by 2 units every 2 days as directed   [DISCONTINUED] Insulin Pen Needle 32G X 6 MM MISC Inject as directed   [DISCONTINUED] metFORMIN (GLUCOPHAGE) 500 MG tablet Take 1 tablet (500 mg total) by mouth 2 (two) times daily with a meal.   No facility-administered encounter medications on file as of 11/04/2021.    Allergies (verified) Patient has no known allergies.   History: Past Medical History:  Diagnosis Date   Colon polyps    Coronary artery disease    History of blood clots    Hypertension    Past Surgical History:  Procedure Laterality Date   CORONARY STENT INTERVENTION     No family history on file. Social History   Socioeconomic History   Marital status: Married    Spouse name: Not on file   Number of children: Not on file   Years of education: Not on file   Highest education level: Not on file  Occupational History   Not on file  Tobacco Use  Smoking status: Former   Smokeless tobacco: Never  Building services engineer Use: Never used  Substance and Sexual Activity   Alcohol use: Never   Drug use: Never   Sexual activity: Not on file  Other Topics Concern   Not on file  Social History Narrative   Not on file   Social Determinants of Health   Financial Resource Strain: Low Risk  (11/04/2021)   Overall Financial Resource Strain (CARDIA)    Difficulty of Paying Living Expenses: Not hard at all  Food Insecurity: No Food Insecurity (11/04/2021)   Hunger Vital Sign    Worried About Running Out of Food in the Last Year: Never true    Ran Out of Food in the Last Year: Never true  Transportation Needs: No  Transportation Needs (11/04/2021)   PRAPARE - Administrator, Civil Service (Medical): No    Lack of Transportation (Non-Medical): No  Physical Activity: Inactive (11/04/2021)   Exercise Vital Sign    Days of Exercise per Week: 0 days    Minutes of Exercise per Session: 0 min  Stress: No Stress Concern Present (11/04/2021)   [employer] of Occupational Health - Occupational Stress Questionnaire    Feeling of Stress : Not at all  Social Connections: Moderately Integrated (11/04/2021)   Social Connection and Isolation Panel [NHANES]    Frequency of Communication with Friends and Family: More than three times a week    Frequency of Social Gatherings with Friends and Family: Twice a week    Attends Religious Services: More than 4 times per year    Active Member of [employer] or Organizations: No    Attends Engineer, structural: Never    Marital Status: Married    Tobacco Counseling Counseling given: Not Answered   Clinical Intake:  Pre-visit preparation completed: Yes  Pain : No/denies pain  How often do you need to have someone help you when you read instructions, pamphlets, or other written materials from your doctor or pharmacy?: 1 - Never  Diabetic? Yes Nutrition Risk Assessment:  Has the patient had any N/V/D within the last 2 months?  No  Does the patient have any non-healing wounds?  No  Has the patient had any unintentional weight loss or weight gain?  No   Diabetes:  Is the patient diabetic?  Yes  If diabetic, was a CBG obtained today?  No  Did the patient bring in their glucometer from home?   Audio visit How often do you monitor your CBG's? Three times a day.   Financial Strains and Diabetes Management:  Are you having any financial strains with the device, your supplies or your medication? No .  Does the patient want to be seen by Chronic Care Management for management of their diabetes?  No  Would the patient like to be referred to a  Nutritionist or for Diabetic Management?  No   Diabetic Exams:  Diabetic Eye Exam: Overdue for diabetic eye exam. Pt has been advised about the importance in completing this exam. Patient advised to call and schedule an eye exam. Diabetic Foot Exam: Overdue, Pt has been advised about the importance in completing this exam. Pt is scheduled for diabetic foot exam on N/a.   Interpreter Needed?: No  Information entered by :: Donne Anon, CMA   Activities of Daily Living    11/04/2021    3:18 PM 10/29/2021    7:15 AM  In your present state of health, do you  have any difficulty performing the following activities:  Hearing? 0 0  Vision? 0 1  Difficulty concentrating or making decisions? 0 0  Walking or climbing stairs? 0 1  Dressing or bathing? 0 0  Doing errands, shopping? 0 0  Preparing Food and eating ? N N  Using the Toilet? N N  In the past six months, have you accidently leaked urine? N Y  Do you have problems with loss of bowel control? N N  Managing your Medications? N Y  Managing your Finances? N N  Housekeeping or managing your Housekeeping? N N    Patient Care Team: Olive Bass, FNP as PCP - General (Internal Medicine)  Indicate any recent Medical Services you may have received from other than [facility] providers in the past year (date may be approximate).     Assessment:   This is a routine wellness examination for Emily.  Hearing/Vision screen No results found.  Dietary issues and exercise activities discussed: Current Exercise Habits: The patient has a physically strenuous job, but has no regular exercise apart from work., Exercise limited by: None identified   Goals Addressed   None    Depression Screen    11/04/2021    3:13 PM 09/30/2021   10:18 AM 09/01/2021    9:36 AM  PHQ 2/9 Scores  PHQ - 2 Score 0 0 0    Fall Risk    11/04/2021    3:13 PM 10/29/2021    7:15 AM 09/30/2021   10:18 AM 09/01/2021    9:35 AM  Fall Risk   Falls in the past year?  0 0 0 0  Number falls in past yr: 0  0 0  Injury with Fall? 0  0 0  Risk for fall due to : No Fall Risks  No Fall Risks No Fall Risks  Follow up Falls evaluation completed  Falls evaluation completed Falls evaluation completed    FALL RISK PREVENTION PERTAINING TO THE HOME:  Any stairs in or around the home? No  If so, are there any without handrails?  No stairs Home free of loose throw rugs in walkways, pet beds, electrical cords, etc? Yes  Adequate lighting in your home to reduce risk of falls? Yes   ASSISTIVE DEVICES UTILIZED TO PREVENT FALLS:  Life alert? No  Use of a cane, walker or w/c? No  Grab bars in the bathroom? Yes  Shower chair or bench in shower? No  Elevated toilet seat or a handicapped toilet? No   TIMED UP AND GO:  Was the test performed?  Audio visit .    Cognitive Function:        11/04/2021    3:36 PM  6CIT Screen  What Year? 0 points  What month? 0 points  What time? 0 points  Count back from 20 0 points  Months in reverse 0 points  Repeat phrase 8 points  Total Score 8 points    Immunizations Immunization History  Administered Date(s) Administered   Fluad Quad(high Dose 65+) 10/14/2018   Influenza Inj Mdck Quad Pf 10/07/2017   Influenza, High Dose Seasonal PF 12/16/2020   Influenza,inj,Quad PF,6+ Mos 12/07/2014   Influenza,inj,quad, With Preservative 11/17/2013   PFIZER Comirnaty(Gray Top)Covid-19 Tri-Sucrose Vaccine 04/14/2019, 05/12/2019, 03/02/2020   Pneumococcal Polysaccharide-23 01/15/2014    TDAP status: Due, Education has been provided regarding the importance of this vaccine. Advised may receive this vaccine at local pharmacy or Health Dept. Aware to provide a copy of the vaccination record  if obtained from local pharmacy or Health Dept. Verbalized acceptance and understanding.  Flu Vaccine status: Due, Education has been provided regarding the importance of this vaccine. Advised may receive this vaccine at local pharmacy or Health  Dept. Aware to provide a copy of the vaccination record if obtained from local pharmacy or Health Dept. Verbalized acceptance and understanding.  Pneumococcal vaccine status: Due, Education has been provided regarding the importance of this vaccine. Advised may receive this vaccine at local pharmacy or Health Dept. Aware to provide a copy of the vaccination record if obtained from local pharmacy or Health Dept. Verbalized acceptance and understanding.  Covid-19 vaccine status: Information provided on how to obtain vaccines.   Qualifies for Shingles Vaccine? Yes   Zostavax completed No   Shingrix Completed?: No.    Education has been provided regarding the importance of this vaccine. Patient has been advised to call insurance company to determine out of pocket expense if they have not yet received this vaccine. Advised may also receive vaccine at local pharmacy or Health Dept. Verbalized acceptance and understanding.  Screening Tests Health Maintenance  Topic Date Due   FOOT EXAM  Never done   OPHTHALMOLOGY EXAM  Never done   Hepatitis C Screening  Never done   TETANUS/TDAP  Never done   COLONOSCOPY (Pts 45-76yrs Insurance coverage will need to be confirmed)  Never done   Zoster Vaccines- Shingrix (1 of 2) Never done   Pneumonia Vaccine 53+ Years old (2 - PCV) 07/14/2018   COVID-19 Vaccine (4 - Pfizer series) 04/27/2020   INFLUENZA VACCINE  08/30/2021   HEMOGLOBIN A1C  02/15/2022   Diabetic kidney evaluation - GFR measurement  09/02/2022   Diabetic kidney evaluation - Urine ACR  09/28/2022   HPV VACCINES  Aged Out    Health Maintenance  Health Maintenance Due  Topic Date Due   FOOT EXAM  Never done   OPHTHALMOLOGY EXAM  Never done   Hepatitis C Screening  Never done   TETANUS/TDAP  Never done   COLONOSCOPY (Pts 45-70yrs Insurance coverage will need to be confirmed)  Never done   Zoster Vaccines- Shingrix (1 of 2) Never done   Pneumonia Vaccine 59+ Years old (2 - PCV) 07/14/2018    COVID-19 Vaccine (4 - Pfizer series) 04/27/2020   INFLUENZA VACCINE  08/30/2021    Colorectal Cancer Screen:  pt states he had one in June with Dr. Tilman Neat in [place].  Results not it chart so will fax records request.  Lung Cancer Screening: (Low Dose CT Chest recommended if Age 33-80 years, 30 pack-year currently smoking OR have quit w/in 15years.) does not qualify.   Lung Cancer Screening Referral: N/a  Additional Screening:  Hepatitis C Screening: does qualify; Completed N/a  Vision Screening: Recommended annual ophthalmology exams for early detection of glaucoma and other disorders of the eye. Is the patient up to date with their annual eye exam?  Yes  Who is the provider or what is the name of the office in which the patient attends annual eye exams? Dr. Shelly Bombard If pt is not established with a provider, would they like to be referred to a provider to establish care? No .   Dental Screening: Recommended annual dental exams for proper oral hygiene  Community Resource Referral / Chronic Care Management: CRR required this visit?  No   CCM required this visit?  No      Plan:     I have personally reviewed and noted the following  in the patient's chart:   Medical and social history Use of alcohol, tobacco or illicit drugs  Current medications and supplements including opioid prescriptions. Patient is not currently taking opioid prescriptions. Functional ability and status Nutritional status Physical activity Advanced directives List of other physicians Hospitalizations, surgeries, and ER visits in previous 12 months Vitals Screenings to include cognitive, depression, and falls Referrals and appointments  In addition, I have reviewed and discussed with patient certain preventive protocols, quality metrics, and best practice recommendations. A written personalized care plan for preventive services as well as general preventive health recommendations were provided to  patient.   Due to this being a telephonic visit, the after visit summary with patients personalized plan was offered to patient via mail or my-chart. Patient would like to access on my-chart.  Donne Anon, CMA   11/04/2021   Nurse Notes: None  Medical screening examination/treatment/procedure(s) were performed by non-physician practitioner and as supervising provider I was immediately available for consultation/collaboration.  I agree with above. Olive Bass, FNP

## 2021-11-04 NOTE — Patient Instructions (Signed)
Jeremy Ryan , Thank you for taking time to come for your Medicare Wellness Visit. I appreciate your ongoing commitment to your health goals. Please review the following plan we discussed and let me know if I can assist you in the future.   These are the goals we discussed:  Goals   None     This is a list of the screening recommended for you and due dates:  Health Maintenance  Topic Date Due   Complete foot exam   Never done   Eye exam for diabetics  Never done   Hepatitis C Screening: USPSTF Recommendation to screen - Ages 49-79 yo.  Never done   Tetanus Vaccine  Never done   Colon Cancer Screening  Never done   Zoster (Shingles) Vaccine (1 of 2) Never done   Pneumonia Vaccine (2 - PCV) 07/14/2018   COVID-19 Vaccine (4 - Pfizer series) 04/27/2020   Flu Shot  08/30/2021   Hemoglobin A1C  02/15/2022   Yearly kidney function blood test for diabetes  09/02/2022   Yearly kidney health urinalysis for diabetes  09/28/2022   HPV Vaccine  Aged Out     Next appointment: Follow up in one year for your annual wellness visit.   Preventive Care 33 Years and Older, Male Preventive care refers to lifestyle choices and visits with your health care provider that can promote health and wellness. What does preventive care include? A yearly physical exam. This is also called an annual well check. Dental exams once or twice a year. Routine eye exams. Ask your health care provider how often you should have your eyes checked. Personal lifestyle choices, including: Daily care of your teeth and gums. Regular physical activity. Eating a healthy diet. Avoiding tobacco and drug use. Limiting alcohol use. Practicing safe sex. Taking low doses of aspirin every day. Taking vitamin and mineral supplements as recommended by your health care provider. What happens during an annual well check? The services and screenings done by your health care provider during your annual well check will depend on your  age, overall health, lifestyle risk factors, and family history of disease. Counseling  Your health care provider may ask you questions about your: Alcohol use. Tobacco use. Drug use. Emotional well-being. Home and relationship well-being. Sexual activity. Eating habits. History of falls. Memory and ability to understand (cognition). Work and work Statistician. Screening  You may have the following tests or measurements: Height, weight, and BMI. Blood pressure. Lipid and cholesterol levels. These may be checked every 5 years, or more frequently if you are over 17 years old. Skin check. Lung cancer screening. You may have this screening every year starting at age 48 if you have a 30-pack-year history of smoking and currently smoke or have quit within the past 15 years. Fecal occult blood test (FOBT) of the stool. You may have this test every year starting at age 30. Flexible sigmoidoscopy or colonoscopy. You may have a sigmoidoscopy every 5 years or a colonoscopy every 10 years starting at age 36. Prostate cancer screening. Recommendations will vary depending on your family history and other risks. Hepatitis C blood test. Hepatitis B blood test. Sexually transmitted disease (STD) testing. Diabetes screening. This is done by checking your blood sugar (glucose) after you have not eaten for a while (fasting). You may have this done every 1-3 years. Abdominal aortic aneurysm (AAA) screening. You may need this if you are a current or former smoker. Osteoporosis. You may be screened starting at age  70 if you are at high risk. Talk with your health care provider about your test results, treatment options, and if necessary, the need for more tests. Vaccines  Your health care provider may recommend certain vaccines, such as: Influenza vaccine. This is recommended every year. Tetanus, diphtheria, and acellular pertussis (Tdap, Td) vaccine. You may need a Td booster every 10 years. Zoster  vaccine. You may need this after age 41. Pneumococcal 13-valent conjugate (PCV13) vaccine. One dose is recommended after age 82. Pneumococcal polysaccharide (PPSV23) vaccine. One dose is recommended after age 69. Talk to your health care provider about which screenings and vaccines you need and how often you need them. This information is not intended to replace advice given to you by your health care provider. Make sure you discuss any questions you have with your health care provider. Document Released: 02/12/2015 Document Revised: 10/06/2015 Document Reviewed: 11/17/2014 Elsevier Interactive Patient Education  2017 [place] Prevention in the Home Falls can cause injuries. They can happen to people of all ages. There are many things you can do to make your home safe and to help prevent falls. What can I do on the outside of my home? Regularly fix the edges of walkways and driveways and fix any cracks. Remove anything that might make you trip as you walk through a door, such as a raised step or threshold. Trim any bushes or trees on the path to your home. Use bright outdoor lighting. Clear any walking paths of anything that might make someone trip, such as rocks or tools. Regularly check to see if handrails are loose or broken. Make sure that both sides of any steps have handrails. Any raised decks and porches should have guardrails on the edges. Have any leaves, snow, or ice cleared regularly. Use sand or salt on walking paths during winter. Clean up any spills in your garage right away. This includes oil or grease spills. What can I do in the bathroom? Use night lights. Install grab bars by the toilet and in the tub and shower. Do not use towel bars as grab bars. Use non-skid mats or decals in the tub or shower. If you need to sit down in the shower, use a plastic, non-slip stool. Keep the floor dry. Clean up any water that spills on the floor as soon as it happens. Remove  soap buildup in the tub or shower regularly. Attach bath mats securely with double-sided non-slip rug tape. Do not have throw rugs and other things on the floor that can make you trip. What can I do in the bedroom? Use night lights. Make sure that you have a light by your bed that is easy to reach. Do not use any sheets or blankets that are too big for your bed. They should not hang down onto the floor. Have a firm chair that has side arms. You can use this for support while you get dressed. Do not have throw rugs and other things on the floor that can make you trip. What can I do in the kitchen? Clean up any spills right away. Avoid walking on wet floors. Keep items that you use a lot in easy-to-reach places. If you need to reach something above you, use a strong step stool that has a grab bar. Keep electrical cords out of the way. Do not use floor polish or wax that makes floors slippery. If you must use wax, use non-skid floor wax. Do not have throw rugs and other  things on the floor that can make you trip. What can I do with my stairs? Do not leave any items on the stairs. Make sure that there are handrails on both sides of the stairs and use them. Fix handrails that are broken or loose. Make sure that handrails are as long as the stairways. Check any carpeting to make sure that it is firmly attached to the stairs. Fix any carpet that is loose or worn. Avoid having throw rugs at the top or bottom of the stairs. If you do have throw rugs, attach them to the floor with carpet tape. Make sure that you have a light switch at the top of the stairs and the bottom of the stairs. If you do not have them, ask someone to add them for you. What else can I do to help prevent falls? Wear shoes that: Do not have high heels. Have rubber bottoms. Are comfortable and fit you well. Are closed at the toe. Do not wear sandals. If you use a stepladder: Make sure that it is fully opened. Do not climb a  closed stepladder. Make sure that both sides of the stepladder are locked into place. Ask someone to hold it for you, if possible. Clearly mark and make sure that you can see: Any grab bars or handrails. First and last steps. Where the edge of each step is. Use tools that help you move around (mobility aids) if they are needed. These include: Canes. Walkers. Scooters. Crutches. Turn on the lights when you go into a dark area. Replace any light bulbs as soon as they burn out. Set up your furniture so you have a clear path. Avoid moving your furniture around. If any of your floors are uneven, fix them. If there are any pets around you, be aware of where they are. Review your medicines with your doctor. Some medicines can make you feel dizzy. This can increase your chance of falling. Ask your doctor what other things that you can do to help prevent falls. This information is not intended to replace advice given to you by your health care provider. Make sure you discuss any questions you have with your health care provider. Document Released: 11/12/2008 Document Revised: 06/24/2015 Document Reviewed: 02/20/2014 Elsevier Interactive Patient Education  2017 [employer].

## 2021-11-15 LAB — HEMOGLOBIN A1C: Hemoglobin A1C: 6.1

## 2021-11-18 DIAGNOSIS — R1312 Dysphagia, oropharyngeal phase: Secondary | ICD-10-CM | POA: Diagnosis not present

## 2021-11-18 DIAGNOSIS — K591 Functional diarrhea: Secondary | ICD-10-CM | POA: Diagnosis not present

## 2021-12-05 ENCOUNTER — Encounter (HOSPITAL_BASED_OUTPATIENT_CLINIC_OR_DEPARTMENT_OTHER): Payer: Self-pay | Admitting: Emergency Medicine

## 2021-12-05 ENCOUNTER — Emergency Department (HOSPITAL_BASED_OUTPATIENT_CLINIC_OR_DEPARTMENT_OTHER)
Admission: EM | Admit: 2021-12-05 | Discharge: 2021-12-05 | Disposition: A | Discharge status: Home / Self Care | Payer: Medicare (Managed Care) | Attending: Emergency Medicine | Admitting: Emergency Medicine

## 2021-12-05 ENCOUNTER — Other Ambulatory Visit: Payer: Self-pay

## 2021-12-05 DIAGNOSIS — Z7902 Long term (current) use of antithrombotics/antiplatelets: Secondary | ICD-10-CM | POA: Diagnosis not present

## 2021-12-05 DIAGNOSIS — I10 Essential (primary) hypertension: Secondary | ICD-10-CM | POA: Insufficient documentation

## 2021-12-05 DIAGNOSIS — Z7982 Long term (current) use of aspirin: Secondary | ICD-10-CM | POA: Diagnosis not present

## 2021-12-05 DIAGNOSIS — E1165 Type 2 diabetes mellitus with hyperglycemia: Secondary | ICD-10-CM | POA: Diagnosis present

## 2021-12-05 DIAGNOSIS — R7309 Other abnormal glucose: Secondary | ICD-10-CM

## 2021-12-05 LAB — CBG MONITORING, ED
Glucose-Capillary: 91 mg/dL (ref 70–99)
Glucose-Capillary: 84 mg/dL (ref 70–99)

## 2021-12-05 LAB — BASIC METABOLIC PANEL
Anion gap: 8 (ref 5–15)
BUN: 22 mg/dL (ref 8–23)
CO2: 26 mmol/L (ref 22–32)
Calcium: 9.8 mg/dL (ref 8.9–10.3)
Chloride: 106 mmol/L (ref 98–111)
Creatinine, Ser: 0.96 mg/dL (ref 0.61–1.24)
GFR, Estimated: >60 mL/min (ref >60)
Glucose, Bld: 101 mg/dL — ABNORMAL HIGH (ref 70–99)
Potassium: 4.2 mmol/L (ref 3.5–5.1)
Sodium: 140 mmol/L (ref 135–145)

## 2021-12-05 LAB — URINALYSIS, ROUTINE W REFLEX MICROSCOPIC
Bilirubin Urine: NEGATIVE
Glucose, UA: >=500 mg/dL — AB
Hgb urine dipstick: NEGATIVE
Ketones, ur: NEGATIVE mg/dL
Leukocytes,Ua: NEGATIVE
Nitrite: NEGATIVE
Protein, ur: NEGATIVE mg/dL
Specific Gravity, Urine: 1.015 (ref 1.005–1.030)
pH: 6 (ref 5.0–8.0)

## 2021-12-05 LAB — URINALYSIS, MICROSCOPIC (REFLEX)

## 2021-12-05 LAB — CBC
HCT: 44.8 % (ref 39.0–52.0)
Hemoglobin: 14.5 g/dL (ref 13.0–17.0)
MCH: 27.2 pg (ref 26.0–34.0)
MCHC: 32.4 g/dL (ref 30.0–36.0)
MCV: 83.9 fL (ref 80.0–100.0)
Platelets: 194 10*3/uL (ref 150–400)
RBC: 5.34 MIL/uL (ref 4.22–5.81)
RDW: 13.3 % (ref 11.5–15.5)
WBC: 6 10*3/uL (ref 4.0–10.5)
nRBC: 0 % (ref 0.0–0.2)

## 2021-12-05 NOTE — ED Notes (Signed)
States he ck his CBG this am and reading was 130's, then approx hour later his phone notified him that his Blood Sugar was over 200. This past Saturday changed his sensor on his arm. Denies any dizziness, weakness or fatigue, no vision issues.

## 2021-12-05 NOTE — ED Triage Notes (Signed)
Patient presents to ED via POV from home. Here with hyperglycemia since Friday. Denies nausea/vomiting. Reports polyuria.

## 2021-12-05 NOTE — ED Provider Notes (Signed)
[facility] EMERGENCY DEPARTMENT Provider Note   CSN: [id number] Arrival date & time: 12/05/21  [id number]     History  Chief Complaint  Patient presents with   Hyperglycemia    Jeremy Ryan is a 66 y.o. male.  Patient is a 66 year old male with a past medical history of diabetes, CAD, paroxysmal A-fib presenting to the emergency department with hyperglycemia.  The patient states that since Friday his blood sugars have been going up and down.  He states that sometimes it would be up to 160 and other times down to 80.  He states that he has a arm monitor that tells him on his phone with his blood sugars are alerts him when they are abnormal.  He states that this morning his monitor alerted him that his blood sugar was 270.  He states he called his primary doctor who recommended that he come to the ER.  He states that he has been taking his medication as prescribed.  He denies any recent fevers or chills, nausea or vomiting, diarrhea or constipation, dysuria or hematuria.  He denies any recent steroid use or recent medication changes.  The history is provided by the patient.  Hyperglycemia      Home Medications Prior to Admission medications   Medication Sig Start Date End Date Taking? Authorizing Provider  aspirin EC 81 MG tablet Take 81 mg by mouth daily. Swallow whole.    [provider]  atorvastatin (LIPITOR) 80 MG tablet Take 1 tablet by mouth daily. 01/12/21   [provider]  busPIRone (BUSPAR) 5 MG tablet Take 5 mg by mouth 2 (two) times daily. 06/10/21   [provider]  busPIRone (BUSPAR) 7.5 MG tablet Take by mouth. 07/04/21   [provider]  clopidogrel (PLAVIX) 75 MG tablet Take 75 mg by mouth daily. 06/23/21   [provider]  dicyclomine (BENTYL) 20 MG tablet Take 20 mg by mouth 4 (four) times daily. 07/15/21   [provider]  furosemide (LASIX) 20 MG tablet SMARTSIG:1-2 Tablet(s) By Mouth 06/10/21   [provider]  isosorbide mononitrate (IMDUR) 30 MG 24 hr tablet Take 30 mg by mouth 2 (two) times daily. 03/03/21   [provider]  JARDIANCE 25 MG TABS tablet Take 25 mg by mouth daily. 10/04/21   [provider]  losartan (COZAAR) 50 MG tablet Take 1 tablet (50 mg total) by mouth daily. 09/13/21   Marrian Salvage, FNP  pantoprazole (PROTONIX) 40 MG tablet Take 1 tablet (40 mg total) by mouth daily. 09/13/21   Marrian Salvage, FNP  propranolol (INDERAL) 40 MG tablet Take 1 tablet (40 mg total) by mouth 3 (three) times daily. 09/13/21   Marrian Salvage, [place]      Allergies    Patient has no known allergies.    Review of Systems   Review of Systems  Physical Exam Updated Vital Signs BP (!) 152/78 (BP Location: Right Arm)   Pulse (!) 50   Temp 97.9 F (36.6 C) (Oral)   Resp 18   SpO2 98%  Physical Exam Vitals and nursing note reviewed.  Constitutional:      General: He is not in acute distress.    Appearance: Normal appearance.  HENT:     Head: Normocephalic and atraumatic.     Nose: Nose normal.     Mouth/Throat:     Mouth: Mucous membranes are moist.     Pharynx: Oropharynx is clear.  Eyes:  Extraocular Movements: Extraocular movements intact.     Conjunctiva/sclera: Conjunctivae normal.  Cardiovascular:     Rate and Rhythm: Normal rate and regular rhythm.     Pulses: Normal pulses.     Heart sounds: Normal heart sounds.  Pulmonary:     Effort: Pulmonary effort is normal.     Breath sounds: Normal breath sounds.  Abdominal:     General: Abdomen is flat.     Palpations: Abdomen is soft.     Tenderness: There is no abdominal tenderness.  Musculoskeletal:        General: Normal range of motion.     Cervical back: Normal range of motion and neck supple.  Skin:    General: Skin is warm and dry.  Neurological:     General: No focal deficit present.     Mental Status: He is alert and oriented to person, place, and time.   Psychiatric:        Mood and Affect: Mood normal.        Behavior: Behavior normal.     ED Results / Procedures / Treatments   Labs (all labs ordered are listed, but only abnormal results are displayed) Labs Reviewed  BASIC METABOLIC PANEL - Abnormal; Notable for the following components:      Result Value   Glucose, Bld 101 (*)    All other components within normal limits  URINALYSIS, ROUTINE W REFLEX MICROSCOPIC - Abnormal; Notable for the following components:   Glucose, UA >=500 (*)    All other components within normal limits  URINALYSIS, MICROSCOPIC (REFLEX) - Abnormal; Notable for the following components:   Bacteria, UA RARE (*)    All other components within normal limits  CBC  CBG MONITORING, ED  CBG MONITORING, ED  CBG MONITORING, ED    EKG None  Radiology No results found.  Procedures Procedures    Medications Ordered in ED Medications - No data to display  ED Course/ Medical Decision Making/ A&P                           Medical Decision Making This patient presents to the ED with chief complaint(s) of hyperglycemia with pertinent past medical history of diabetes, CAD, paroxysmal A-fib which further complicates the presenting complaint. The complaint involves an extensive differential diagnosis and also carries with it a high risk of complications and morbidity.    The differential diagnosis includes hyperglycemia, electrolyte abnormality, dehydration, infection  Additional history obtained: Additional history obtained from spouse Records reviewed N/A  ED Course and Reassessment: She was initially evaluated in triage and had labs performed that showed a glucose of 101.  He had no evidence of DKA or HHS.  Urine shows glucosuria but no signs of infection and he has a normal WBC count.  He has no infectious symptoms on his exam.  His repeat blood sugar was 84.  He stable for discharge home with primary care follow-up and was recommended to keep a log of  his blood sugars to discuss with his primary doctor to see if he needs any medication changes.  He was given strict return precautions.  Independent labs interpretation:  The following labs were independently interpreted: Within normal range  Independent visualization of imaging: N/A  Consultation: - Consulted or discussed management/test interpretation w/ external professional: N/A  Consideration for admission or further workup: Patient has no emergent conditions requiring admission or further work-up at this time and is stable for  discharge with primary care follow-up Social Determinants of health: N/A    Amount and/or Complexity of Data Reviewed Labs: ordered.          Final Clinical Impression(s) / ED Diagnoses Final diagnoses:  Abnormal blood sugar    Rx / DC Orders ED Discharge Orders     None         Kemper Durie, DO 12/05/21 1430

## 2021-12-05 NOTE — Discharge Instructions (Signed)
In the emergency department for your high blood sugar.  Your blood sugar has remained normal throughout your stay in the ER.  Your labs showed no signs of infection or dehydration and no signs of any complication from your sugar being high.  It is unclear what is causing the changes in your blood sugars but you should keep a log of your sugars and follow-up with your primary doctor to see if you need any medication changes.  You should return to the emergency department if your blood sugars are persistently high or too high for your monitor to read, you have fevers, you have abdominal pain, you have repetitive vomiting, you have confusion or if you have any other new or concerning symptoms.

## 2021-12-10 ENCOUNTER — Other Ambulatory Visit: Payer: Self-pay | Admitting: Family

## 2021-12-30 DIAGNOSIS — E119 Type 2 diabetes mellitus without complications: Secondary | ICD-10-CM | POA: Diagnosis not present

## 2022-01-18 DIAGNOSIS — K222 Esophageal obstruction: Secondary | ICD-10-CM | POA: Diagnosis not present

## 2022-01-18 DIAGNOSIS — R1310 Dysphagia, unspecified: Secondary | ICD-10-CM | POA: Insufficient documentation

## 2022-01-18 DIAGNOSIS — K2960 Other gastritis without bleeding: Secondary | ICD-10-CM | POA: Diagnosis not present

## 2022-02-13 ENCOUNTER — Other Ambulatory Visit: Payer: Self-pay | Admitting: Family

## 2022-03-21 ENCOUNTER — Ambulatory Visit: Payer: Medicare (Managed Care) | Admitting: Family

## 2022-03-21 ENCOUNTER — Encounter: Payer: Self-pay | Admitting: Family

## 2022-03-21 VITALS — BP 144/80 | HR 64 | Ht 70.0 in | Wt 203.2 lb

## 2022-03-21 DIAGNOSIS — R1013 Epigastric pain: Secondary | ICD-10-CM | POA: Diagnosis not present

## 2022-03-21 DIAGNOSIS — R109 Unspecified abdominal pain: Secondary | ICD-10-CM

## 2022-03-21 MED ORDER — PANTOPRAZOLE SODIUM 40 MG PO TBEC: 40.0000 mg | DELAYED_RELEASE_TABLET | Freq: Two times a day (BID) | ORAL | 0 refills | Status: DC

## 2022-03-21 NOTE — Patient Instructions (Signed)
Please follow up with Dr. Tamala Julian about continued symptoms from your procedure in December; you can increase your Protonix to twice a day;

## 2022-03-21 NOTE — Progress Notes (Signed)
Jeremy Ryan is a 67 y.o. male with the following history as recorded in EpicCare:  Patient Active Problem List   Diagnosis Date Noted   Type 2 diabetes mellitus without complication, without long-term current use of insulin ([place]) 08/18/2021   Angina pectoris ([place]) 12/28/2020   CAD in native artery 02/17/2020   Dyslipidemia 02/17/2020   Essential hypertension 03/11/2013   Gastro-esophageal reflux disease without esophagitis 03/11/2013   Paroxysmal atrial fibrillation ([place]) 03/11/2013   Benign localized prostatic hyperplasia without lower urinary tract symptoms (LUTS) 11/29/2012    Current Outpatient Medications  Medication Sig Dispense Refill   aspirin EC 81 MG tablet Take 81 mg by mouth daily. Swallow whole.     atorvastatin (LIPITOR) 80 MG tablet Take 1 tablet by mouth daily.     busPIRone (BUSPAR) 5 MG tablet Take 5 mg by mouth 2 (two) times daily.     busPIRone (BUSPAR) 7.5 MG tablet Take by mouth.     clopidogrel (PLAVIX) 75 MG tablet Take 75 mg by mouth daily.     dicyclomine (BENTYL) 20 MG tablet Take 20 mg by mouth 4 (four) times daily.     furosemide (LASIX) 20 MG tablet SMARTSIG:1-2 Tablet(s) By Mouth     isosorbide mononitrate (IMDUR) 30 MG 24 hr tablet Take 30 mg by mouth 2 (two) times daily.     JARDIANCE 25 MG TABS tablet Take 25 mg by mouth daily.     losartan (COZAAR) 50 MG tablet TAKE 1 TABLET BY MOUTH EVERY DAY 90 tablet 1   propranolol (INDERAL) 40 MG tablet TAKE 1 TABLET BY MOUTH 3 TIMES DAILY. 270 tablet 0   pantoprazole (PROTONIX) 40 MG tablet Take 1 tablet (40 mg total) by mouth 2 (two) times daily before a meal. 60 tablet 0   No current facility-administered medications for this visit.    Allergies: Patient has no known allergies.  Past Medical History:  Diagnosis Date   Colon polyps    Coronary artery disease    History of blood clots    Hypertension     Past Surgical History:  Procedure Laterality Date   CORONARY STENT INTERVENTION      No family  history on file.  Social History   Tobacco Use   Smoking status: Former   Smokeless tobacco: Never  Substance Use Topics   Alcohol use: Never    Subjective:   Patient presents with concerns for persisting history of upper abdominal pain; per outside notes, he had dilatation for esophageal stricture in December 2023; has not followed up with that provider even though symptoms have persisted since procedure. There was a complication during the procedure and apparently the esophagus was punctured in 2 places; per patient, there are no changes in his bowels ( was dark while taking Pepto Bismol but has normalized since stopping); denies any coffee grounds emesis;  Patient also notes he does not think he is taking his Protonix regularly- unfortunately some confusion regarding his medication list; He has lost 12 pounds since September 2023 and also notes that he is very worried about cancer due to his [place]; he asks if his "whole body can be scanned."   Objective:  Vitals:   03/21/22 1359 03/21/22 1606  BP: (!) 148/84 (!) 144/80  Pulse: 64   SpO2: 100%   Weight: 203 lb 3.2 oz (92.2 kg)   Height: 5' 10"$  (1.778 m)     General: Well developed, well nourished, in no acute distress  Skin : Warm  and dry.  Head: Normocephalic and atraumatic  Lungs: Respirations unlabored; clear to auscultation bilaterally without wheeze, rales, rhonchi  CVS exam: normal rate and regular rhythm.  Abdomen: Soft; nontender; nondistended; normoactive bowel sounds; no masses or hepatosplenomegaly  Neurologic: Alert and oriented; speech intact; face symmetrical; moves all extremities well; CNII-XII intact without focal deficit  Assessment:  1. Epigastric pain   2. Abdominal pain, unspecified abdominal location     Plan:  Re-start Protonix 40 mg and take bid for now; stressed the importance of seeing [name] specialist in follow up- he agrees to schedule appointment;  Due to weight loss and patient concerns, will update  abd/ pelvic CT; follow up to be determined;   No follow-ups on file.  Orders Placed This Encounter  Procedures   CT Abdomen Pelvis W Contrast    Standing Status:   Future    Standing Expiration Date:   03/22/2023    Order Specific Question:   If indicated for the ordered procedure, I authorize the administration of contrast media per Radiology protocol    Answer:   Yes    Order Specific Question:   Does the patient have a contrast media/X-ray dye allergy?    Answer:   No    Order Specific Question:   Preferred imaging location?    Answer:   [facility] Specific Question:   Is Oral Contrast requested for this exam?    Answer:   Yes, Per Radiology protocol   Hemoglobin A1c    This external order was created through the Results Console.   CBC with Differential/Platelet   Comp Met (CMET)   HM COLONOSCOPY    This external order was created through the Results Console.    Requested Prescriptions   Signed Prescriptions Disp Refills   pantoprazole (PROTONIX) 40 MG tablet 60 tablet 0    Sig: Take 1 tablet (40 mg total) by mouth 2 (two) times daily before a meal.

## 2022-03-22 ENCOUNTER — Telehealth (HOSPITAL_BASED_OUTPATIENT_CLINIC_OR_DEPARTMENT_OTHER): Payer: Self-pay

## 2022-03-22 LAB — CBC WITH DIFFERENTIAL/PLATELET
Basophils Absolute: 0.1 10*3/uL (ref 0.0–0.1)
Basophils Relative: 1.1 % (ref 0.0–3.0)
Eosinophils Absolute: 0.2 10*3/uL (ref 0.0–0.7)
Eosinophils Relative: 3.4 % (ref 0.0–5.0)
HCT: 44.7 % (ref 39.0–52.0)
Hemoglobin: 14.9 g/dL (ref 13.0–17.0)
Lymphocytes Relative: 25.6 % (ref 12.0–46.0)
Lymphs Abs: 1.5 10*3/uL (ref 0.7–4.0)
MCHC: 33.4 g/dL (ref 30.0–36.0)
MCV: 81.6 fl (ref 78.0–100.0)
Monocytes Absolute: 0.5 10*3/uL (ref 0.1–1.0)
Monocytes Relative: 8.4 % (ref 3.0–12.0)
Neutro Abs: 3.5 10*3/uL (ref 1.4–7.7)
Neutrophils Relative %: 61.5 % (ref 43.0–77.0)
Platelets: 204 10*3/uL (ref 150.0–400.0)
RBC: 5.48 Mil/uL (ref 4.22–5.81)
RDW: 16 % — ABNORMAL HIGH (ref 11.5–15.5)
WBC: 5.7 10*3/uL (ref 4.0–10.5)

## 2022-03-22 LAB — COMPREHENSIVE METABOLIC PANEL
ALT: 81 U/L — ABNORMAL HIGH (ref 0–53)
AST: 63 U/L — ABNORMAL HIGH (ref 0–37)
Albumin: 4.4 g/dL (ref 3.5–5.2)
Alkaline Phosphatase: 125 U/L — ABNORMAL HIGH (ref 39–117)
BUN: 24 mg/dL — ABNORMAL HIGH (ref 6–23)
CO2: 26 mEq/L (ref 19–32)
Calcium: 9.8 mg/dL (ref 8.4–10.5)
Chloride: 104 mEq/L (ref 96–112)
Creatinine, Ser: 0.87 mg/dL (ref 0.40–1.50)
GFR: 88.55 mL/min (ref >60.00)
Glucose, Bld: 130 mg/dL — ABNORMAL HIGH (ref 70–99)
Potassium: 4.1 mEq/L (ref 3.5–5.1)
Sodium: 143 mEq/L (ref 135–145)
Total Bilirubin: 0.5 mg/dL (ref 0.2–1.2)
Total Protein: 7.1 g/dL (ref 6.0–8.3)

## 2022-03-31 ENCOUNTER — Ambulatory Visit (HOSPITAL_BASED_OUTPATIENT_CLINIC_OR_DEPARTMENT_OTHER)
Admission: RE | Admit: 2022-03-31 | Discharge: 2022-03-31 | Disposition: A | Discharge status: Home / Self Care | Payer: Medicare (Managed Care) | Source: Ambulatory Visit | Attending: Family | Admitting: Family

## 2022-03-31 ENCOUNTER — Encounter (HOSPITAL_BASED_OUTPATIENT_CLINIC_OR_DEPARTMENT_OTHER): Payer: Self-pay

## 2022-03-31 DIAGNOSIS — R109 Unspecified abdominal pain: Secondary | ICD-10-CM | POA: Diagnosis present

## 2022-03-31 MED ORDER — IOHEXOL 300 MG/ML  SOLN
100.0000 mL | Freq: Once | INTRAMUSCULAR | Status: AC | PRN
  Administered 2022-03-31: 100 mL via INTRAVENOUS

## 2022-04-03 ENCOUNTER — Telehealth: Payer: Self-pay | Admitting: Family

## 2022-04-03 NOTE — Telephone Encounter (Signed)
Prescription Request  04/03/2022  Is this a "Controlled Substance" medicine? No  LOV: 03/21/2022  What is the name of the medication or equipment?  propranolol (INDERAL) 40 MG table   busPIRone (BUSPAR) 7.5 MG tablet    Have you contacted your pharmacy to request a refill? No   Which pharmacy would you like this sent to?   [id number] [place]: [phone]: [phone]   Patient notified that their request is being sent to the clinical staff for review and that they should receive a response within 2 business days.   Please advise at Mobile [phone](mobile)

## 2022-04-05 MED ORDER — PROPRANOLOL HCL 40 MG PO TABS: 40.0000 mg | ORAL_TABLET | Freq: Three times a day (TID) | ORAL | 1 refills | Status: DC

## 2022-04-05 NOTE — Telephone Encounter (Signed)
Spoke with patient about buspirone.  I had to fix med list, he takes 7.5 at breakfast and lunch and '5mg'$  at bedtime.  Are you ok with refilling the 7.'5mg'$ , was filled by another provider looks like?

## 2022-04-05 NOTE — Telephone Encounter (Signed)
Patient stated he got this medication from [facility] the providers last name was Frederickson.  He got it for anxiety, he had crying spells.

## 2022-04-06 ENCOUNTER — Other Ambulatory Visit: Payer: Self-pay | Admitting: Family

## 2022-04-06 MED ORDER — BUSPIRONE HCL 7.5 MG PO TABS: ORAL_TABLET | ORAL | 1 refills | Status: DC

## 2022-04-12 ENCOUNTER — Other Ambulatory Visit: Payer: Self-pay | Admitting: Family

## 2022-05-08 ENCOUNTER — Telehealth: Payer: Self-pay | Admitting: Family

## 2022-05-08 MED ORDER — DICYCLOMINE HCL 20 MG PO TABS: 20.0000 mg | ORAL_TABLET | Freq: Four times a day (QID) | ORAL | 0 refills | Status: DC

## 2022-05-08 NOTE — Addendum Note (Signed)
Addended by: Judieth Keens on: 05/08/2022 02:02 PM   Modules accepted: Orders

## 2022-05-08 NOTE — Telephone Encounter (Signed)
Prescription Request  05/08/2022  Is this a "Controlled Substance" medicine? No  LOV: 03/21/2022  What is the name of the medication or equipment?   dicyclomine (BENTYL) 20 MG tablet   Have you contacted your pharmacy to request a refill? Yes   Which pharmacy would you like this sent to?  [id number] [name], [place] - [address] [place] [address] Phone: [phone] Fax: [phone]    Patient notified that their request is being sent to the clinical staff for review and that they should receive a response within 2 business days.   Please advise at [facility] [phone]

## 2022-05-08 NOTE — Telephone Encounter (Signed)
Rx has been sent in called pt and left a VM informing pt Rx had been sent in as well as he needed to go ahead and follow up with [name] doctor and for any further questions to give the office a call back.

## 2022-05-20 IMAGING — CT CT ANGIO CHEST
2 of 8 series · 19 of 36 positions shown · IV contrast (Omnipaque)
Comparison: 06/24/2015

CLINICAL DATA: Rule out pulmonary embolus. High probability. Pain
with breathing.

EXAM:
CT ANGIOGRAPHY CHEST WITH CONTRAST
TECHNIQUE: Multidetector CT imaging of the chest was performed using the
standard protocol during bolus administration of intravenous
contrast. Multiplanar CT image reconstructions and MIPs were
obtained to evaluate the vascular anatomy.
CONTRAST:  100mL OMNIPAQUE IOHEXOL 350 MG/ML SOLN

[Series 6: pe coronal mpr · coronal · 0.60mm/px · 1 of 152 slices shown]
[im 76/152  mediastinal]
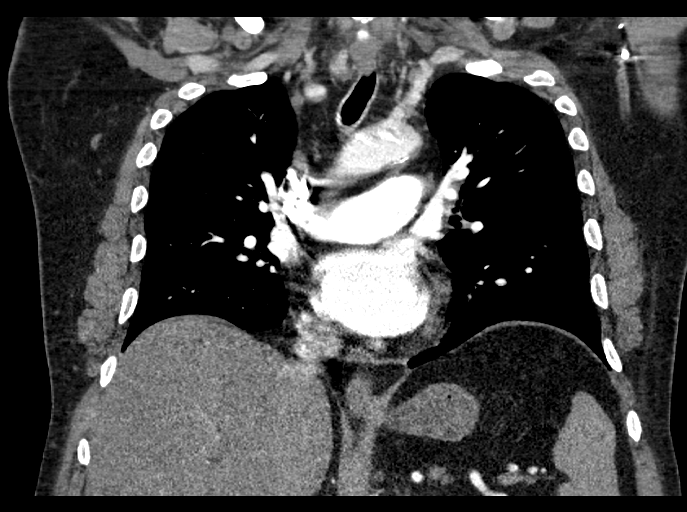

[Series 10: pe thins · axial · 0.81mm/px · z∈[-110,+146]mm · 18 of 288 slices shown]
[im 16/288  lung]
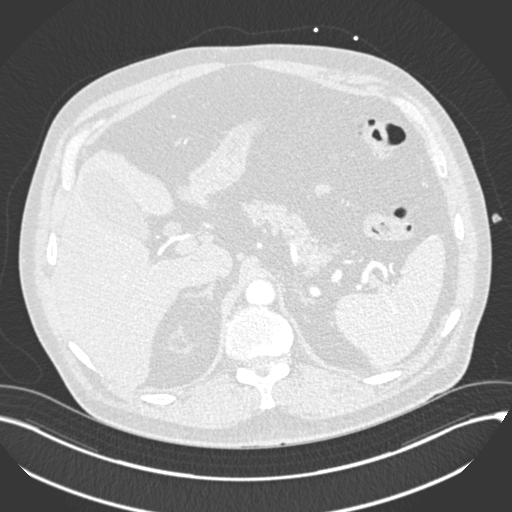
[im 31/288  mediastinal]
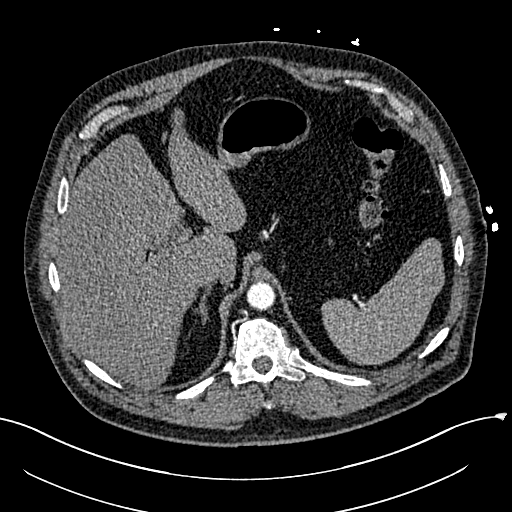
[im 46/288  lung]
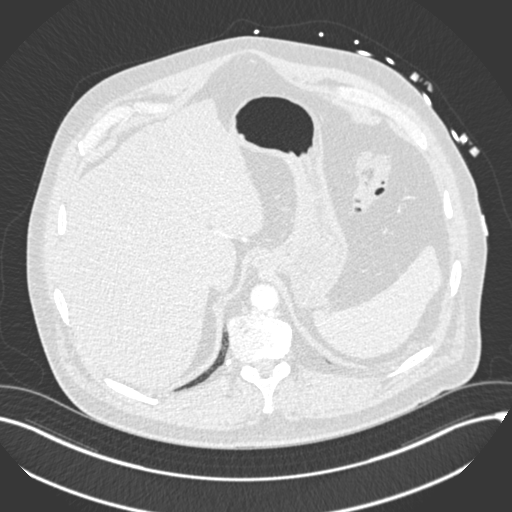
[im 61/288  mediastinal]
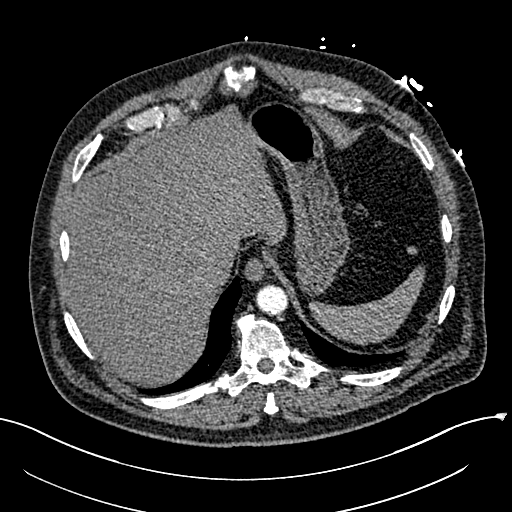
[im 76/288  lung]
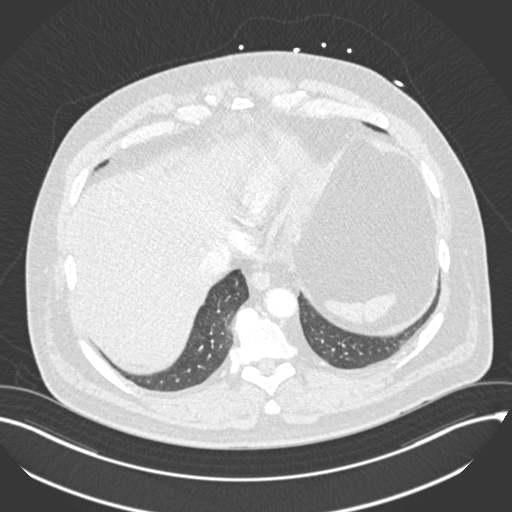
[im 91/288  mediastinal]
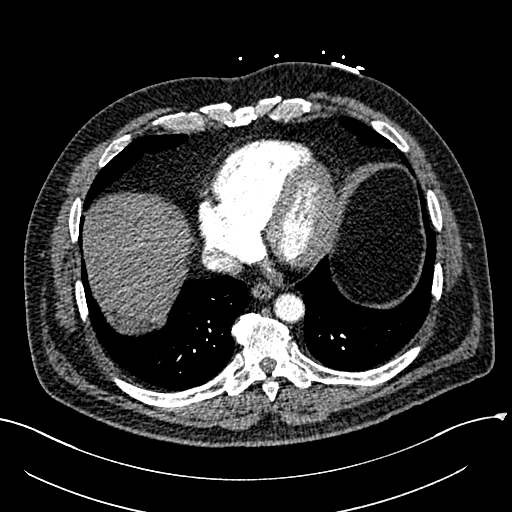
[im 106/288  lung]
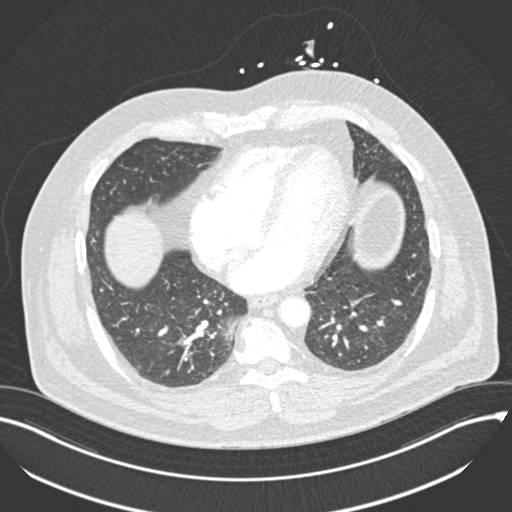
[im 121/288  mediastinal]
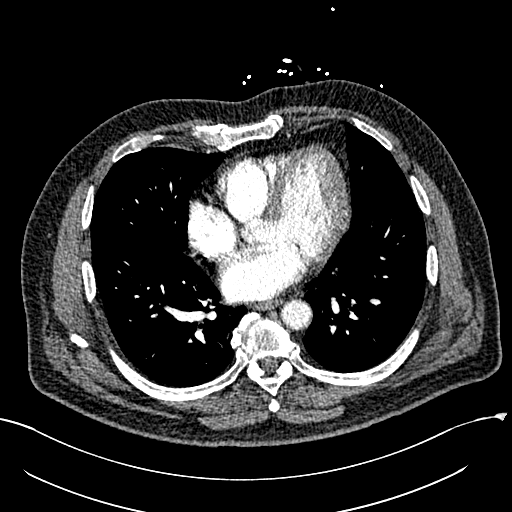
[im 136/288  lung]
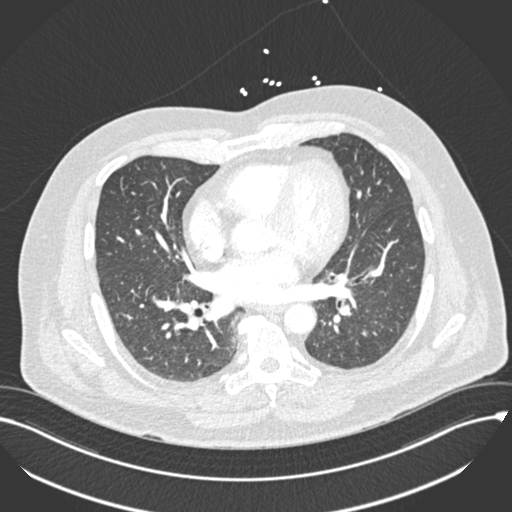
[im 152/288  mediastinal]
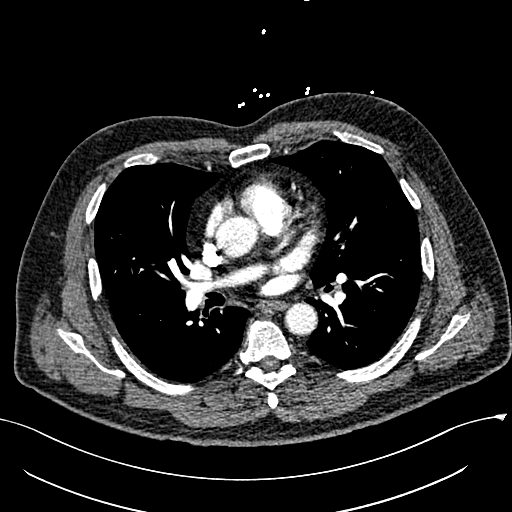
[im 167/288  lung]
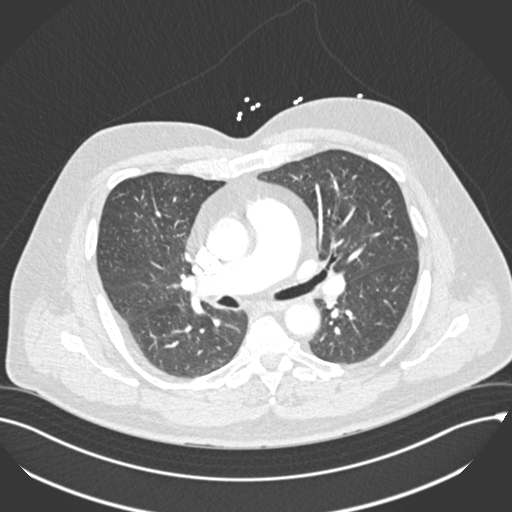
[im 182/288  mediastinal]
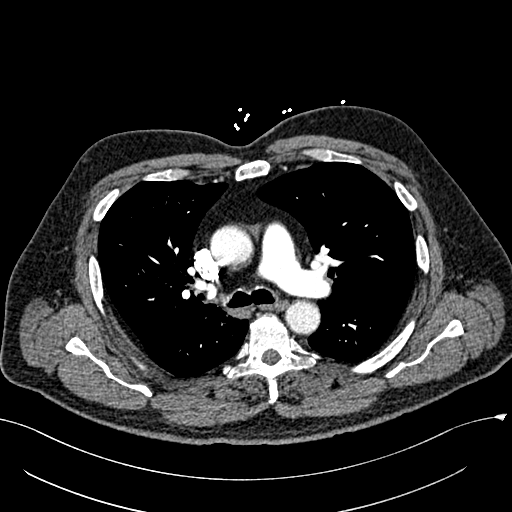
[im 197/288  lung]
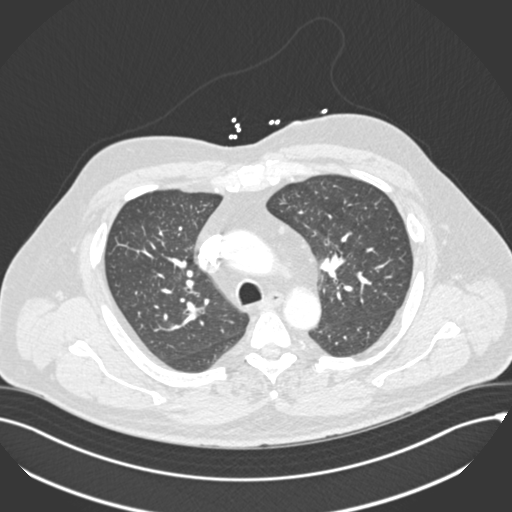
[im 212/288  mediastinal]
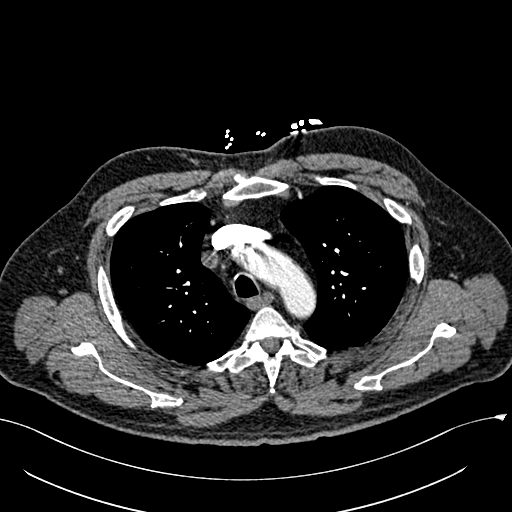
[im 227/288  lung]
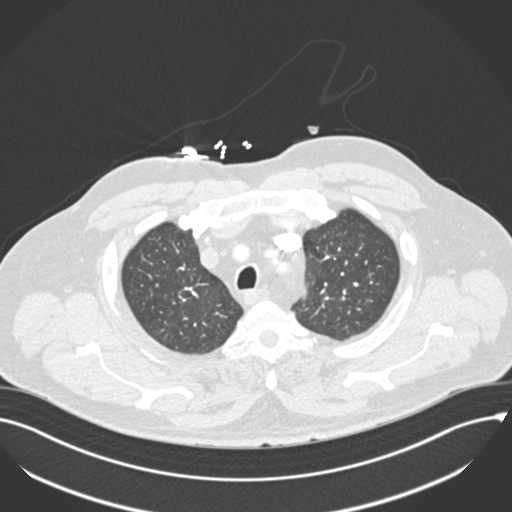
[im 242/288  mediastinal]
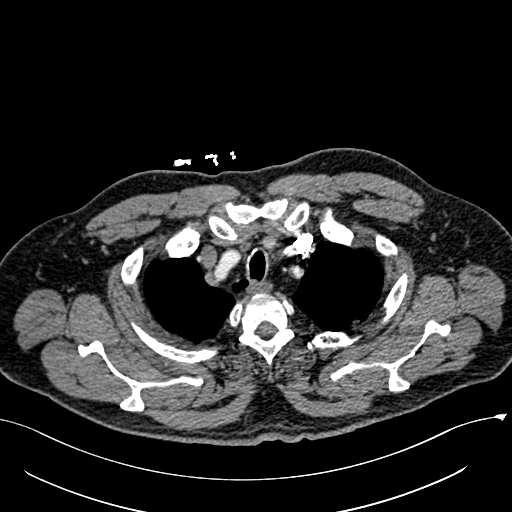
[im 257/288  lung]
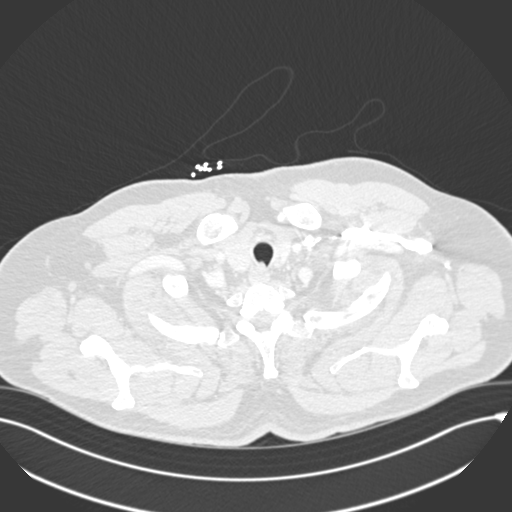
[im 272/288  mediastinal]
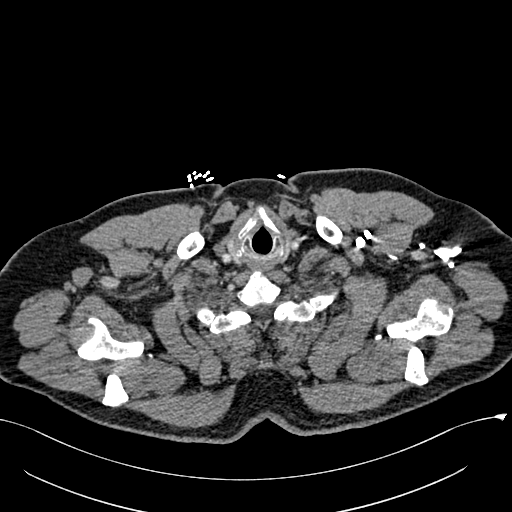

[19 of 36 positions shown; findings below may reference images not displayed]

FINDINGS: Cardiovascular: Normal heart size. No pericardial effusion
identified. Increased diameter of the main pulmonary artery suggests
PA hypertension. There are no abnormal filling defects within the
main pulmonary artery or its branches to suggest an acute pulmonary
embolus.

Mediastinum/Nodes: No enlarged mediastinal, hilar, or axillary lymph
nodes. Thyroid gland, trachea, and esophagus demonstrate no
significant findings.

Lungs/Pleura: No pleural effusion, airspace consolidation, or
atelectasis. No suspicious nodule or mass.

Upper Abdomen: No acute abnormality.

Musculoskeletal: No chest wall abnormality. No acute or significant
osseous findings.

Review of the MIP images confirms the above findings.
IMPRESSION: 1. No evidence for acute pulmonary embolus.
2. Increased diameter of the main pulmonary artery suggests PA
hypertension.

## 2022-06-03 ENCOUNTER — Emergency Department (HOSPITAL_BASED_OUTPATIENT_CLINIC_OR_DEPARTMENT_OTHER): Payer: Medicare (Managed Care)

## 2022-06-03 ENCOUNTER — Inpatient Hospital Stay (HOSPITAL_COMMUNITY): Payer: Medicare (Managed Care)

## 2022-06-03 ENCOUNTER — Other Ambulatory Visit: Payer: Self-pay

## 2022-06-03 ENCOUNTER — Inpatient Hospital Stay (HOSPITAL_BASED_OUTPATIENT_CLINIC_OR_DEPARTMENT_OTHER)
Admission: EM | Admit: 2022-06-03 | Discharge: 2022-06-07 | DRG: 854 | Disposition: A | Discharge status: Home / Self Care | Payer: Medicare (Managed Care) | Attending: Internal Medicine | Admitting: Internal Medicine

## 2022-06-03 ENCOUNTER — Encounter (HOSPITAL_BASED_OUTPATIENT_CLINIC_OR_DEPARTMENT_OTHER): Payer: Self-pay | Admitting: Emergency Medicine

## 2022-06-03 DIAGNOSIS — Z79899 Other long term (current) drug therapy: Secondary | ICD-10-CM

## 2022-06-03 DIAGNOSIS — K589 Irritable bowel syndrome without diarrhea: Secondary | ICD-10-CM | POA: Diagnosis present

## 2022-06-03 DIAGNOSIS — K449 Diaphragmatic hernia without obstruction or gangrene: Secondary | ICD-10-CM | POA: Diagnosis present

## 2022-06-03 DIAGNOSIS — K2950 Unspecified chronic gastritis without bleeding: Secondary | ICD-10-CM | POA: Diagnosis present

## 2022-06-03 DIAGNOSIS — Z87891 Personal history of nicotine dependence: Secondary | ICD-10-CM | POA: Diagnosis not present

## 2022-06-03 DIAGNOSIS — Z7902 Long term (current) use of antithrombotics/antiplatelets: Secondary | ICD-10-CM

## 2022-06-03 DIAGNOSIS — Z86718 Personal history of other venous thrombosis and embolism: Secondary | ICD-10-CM | POA: Diagnosis not present

## 2022-06-03 DIAGNOSIS — K8309 Other cholangitis: Secondary | ICD-10-CM | POA: Diagnosis present

## 2022-06-03 DIAGNOSIS — K8000 Calculus of gallbladder with acute cholecystitis without obstruction: Secondary | ICD-10-CM | POA: Diagnosis not present

## 2022-06-03 DIAGNOSIS — Z1611 Resistance to penicillins: Secondary | ICD-10-CM | POA: Diagnosis present

## 2022-06-03 DIAGNOSIS — Z7901 Long term (current) use of anticoagulants: Secondary | ICD-10-CM | POA: Diagnosis not present

## 2022-06-03 DIAGNOSIS — E119 Type 2 diabetes mellitus without complications: Secondary | ICD-10-CM | POA: Diagnosis present

## 2022-06-03 DIAGNOSIS — K769 Liver disease, unspecified: Secondary | ICD-10-CM | POA: Diagnosis present

## 2022-06-03 DIAGNOSIS — K8051 Calculus of bile duct without cholangitis or cholecystitis with obstruction: Secondary | ICD-10-CM | POA: Insufficient documentation

## 2022-06-03 DIAGNOSIS — Z8711 Personal history of peptic ulcer disease: Secondary | ICD-10-CM

## 2022-06-03 DIAGNOSIS — K219 Gastro-esophageal reflux disease without esophagitis: Secondary | ICD-10-CM | POA: Diagnosis present

## 2022-06-03 DIAGNOSIS — K828 Other specified diseases of gallbladder: Secondary | ICD-10-CM | POA: Diagnosis present

## 2022-06-03 DIAGNOSIS — K224 Dyskinesia of esophagus: Secondary | ICD-10-CM | POA: Diagnosis present

## 2022-06-03 DIAGNOSIS — Z1152 Encounter for screening for COVID-19: Secondary | ICD-10-CM

## 2022-06-03 DIAGNOSIS — I10 Essential (primary) hypertension: Secondary | ICD-10-CM | POA: Diagnosis present

## 2022-06-03 DIAGNOSIS — K839 Disease of biliary tract, unspecified: Principal | ICD-10-CM

## 2022-06-03 DIAGNOSIS — Z8719 Personal history of other diseases of the digestive system: Secondary | ICD-10-CM

## 2022-06-03 DIAGNOSIS — I480 Paroxysmal atrial fibrillation: Secondary | ICD-10-CM | POA: Diagnosis present

## 2022-06-03 DIAGNOSIS — K8046 Calculus of bile duct with acute and chronic cholecystitis without obstruction: Secondary | ICD-10-CM | POA: Diagnosis present

## 2022-06-03 DIAGNOSIS — I2510 Atherosclerotic heart disease of native coronary artery without angina pectoris: Secondary | ICD-10-CM | POA: Diagnosis present

## 2022-06-03 DIAGNOSIS — Z7984 Long term (current) use of oral hypoglycemic drugs: Secondary | ICD-10-CM

## 2022-06-03 DIAGNOSIS — K8050 Calculus of bile duct without cholangitis or cholecystitis without obstruction: Secondary | ICD-10-CM | POA: Diagnosis not present

## 2022-06-03 DIAGNOSIS — Z955 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: Secondary | ICD-10-CM | POA: Diagnosis not present

## 2022-06-03 DIAGNOSIS — Z7982 Long term (current) use of aspirin: Secondary | ICD-10-CM

## 2022-06-03 DIAGNOSIS — A4151 Sepsis due to Escherichia coli [E. coli]: Principal | ICD-10-CM | POA: Diagnosis present

## 2022-06-03 DIAGNOSIS — E785 Hyperlipidemia, unspecified: Secondary | ICD-10-CM | POA: Diagnosis present

## 2022-06-03 DIAGNOSIS — Z86010 Personal history of colonic polyps: Secondary | ICD-10-CM

## 2022-06-03 DIAGNOSIS — R1084 Generalized abdominal pain: Secondary | ICD-10-CM

## 2022-06-03 DIAGNOSIS — I25119 Atherosclerotic heart disease of native coronary artery with unspecified angina pectoris: Secondary | ICD-10-CM | POA: Diagnosis not present

## 2022-06-03 DIAGNOSIS — K851 Biliary acute pancreatitis without necrosis or infection: Secondary | ICD-10-CM | POA: Diagnosis not present

## 2022-06-03 DIAGNOSIS — K8043 Calculus of bile duct with acute cholecystitis with obstruction: Secondary | ICD-10-CM | POA: Diagnosis not present

## 2022-06-03 LAB — CBC WITH DIFFERENTIAL/PLATELET
Abs Immature Granulocytes: 0.04 10*3/uL (ref 0.00–0.07)
Basophils Absolute: 0 10*3/uL (ref 0.0–0.1)
Basophils Relative: 0 %
Eosinophils Absolute: 0 10*3/uL (ref 0.0–0.5)
Eosinophils Relative: 0 %
HCT: 43.5 % (ref 39.0–52.0)
Hemoglobin: 14.3 g/dL (ref 13.0–17.0)
Immature Granulocytes: 0 %
Lymphocytes Relative: 2 %
Lymphs Abs: 0.3 10*3/uL — ABNORMAL LOW (ref 0.7–4.0)
MCH: 27.5 pg (ref 26.0–34.0)
MCHC: 32.9 g/dL (ref 30.0–36.0)
MCV: 83.7 fL (ref 80.0–100.0)
Monocytes Absolute: 0.6 10*3/uL (ref 0.1–1.0)
Monocytes Relative: 4 %
Neutro Abs: 13.5 10*3/uL — ABNORMAL HIGH (ref 1.7–7.7)
Neutrophils Relative %: 94 %
Platelets: 220 10*3/uL (ref 150–400)
RBC: 5.2 MIL/uL (ref 4.22–5.81)
RDW: 14.6 % (ref 11.5–15.5)
WBC: 14.4 10*3/uL — ABNORMAL HIGH (ref 4.0–10.5)
nRBC: 0 % (ref 0.0–0.2)

## 2022-06-03 LAB — URINALYSIS, MICROSCOPIC (REFLEX): RBC / HPF: NONE SEEN RBC/hpf (ref 0–5)

## 2022-06-03 LAB — URINALYSIS, ROUTINE W REFLEX MICROSCOPIC
Glucose, UA: >=500 mg/dL — AB
Hgb urine dipstick: NEGATIVE
Ketones, ur: NEGATIVE mg/dL
Leukocytes,Ua: NEGATIVE
Nitrite: NEGATIVE
Protein, ur: NEGATIVE mg/dL
Specific Gravity, Urine: 1.01 (ref 1.005–1.030)
pH: 7 (ref 5.0–8.0)

## 2022-06-03 LAB — TROPONIN I (HIGH SENSITIVITY): Troponin I (High Sensitivity): 6 ng/L (ref <18)

## 2022-06-03 LAB — COMPREHENSIVE METABOLIC PANEL
ALT: 279 U/L — ABNORMAL HIGH (ref 0–44)
AST: 300 U/L — ABNORMAL HIGH (ref 15–41)
Albumin: 3.4 g/dL — ABNORMAL LOW (ref 3.5–5.0)
Alkaline Phosphatase: 577 U/L — ABNORMAL HIGH (ref 38–126)
Anion gap: 9 (ref 5–15)
BUN: 27 mg/dL — ABNORMAL HIGH (ref 8–23)
CO2: 24 mmol/L (ref 22–32)
Calcium: 9 mg/dL (ref 8.9–10.3)
Chloride: 101 mmol/L (ref 98–111)
Creatinine, Ser: 0.92 mg/dL (ref 0.61–1.24)
GFR, Estimated: >60 mL/min (ref >60)
Glucose, Bld: 134 mg/dL — ABNORMAL HIGH (ref 70–99)
Potassium: 3.7 mmol/L (ref 3.5–5.1)
Sodium: 134 mmol/L — ABNORMAL LOW (ref 135–145)
Total Bilirubin: 5.7 mg/dL — ABNORMAL HIGH (ref 0.3–1.2)
Total Protein: 7.4 g/dL (ref 6.5–8.1)

## 2022-06-03 LAB — HEMOGLOBIN A1C
Hgb A1c MFr Bld: 5.8 % — ABNORMAL HIGH (ref 4.8–5.6)
Mean Plasma Glucose: 119.76 mg/dL

## 2022-06-03 LAB — LACTIC ACID, PLASMA: Lactic Acid, Venous: 1.6 mmol/L (ref 0.5–1.9)

## 2022-06-03 LAB — LIPASE, BLOOD: Lipase: 28 U/L (ref 11–51)

## 2022-06-03 LAB — SARS CORONAVIRUS 2 BY RT PCR: SARS Coronavirus 2 by RT PCR: NEGATIVE

## 2022-06-03 LAB — GLUCOSE, CAPILLARY: Glucose-Capillary: 80 mg/dL (ref 70–99)

## 2022-06-03 MED ORDER — ONDANSETRON HCL 4 MG/2ML IJ SOLN: 4.0000 mg | Freq: Four times a day (QID) | INTRAMUSCULAR | Status: DC | PRN

## 2022-06-03 MED ORDER — PANTOPRAZOLE SODIUM 40 MG IV SOLR
40.0000 mg | Freq: Once | INTRAVENOUS | Status: AC
  Administered 2022-06-03: 40 mg via INTRAVENOUS
  Filled 2022-06-03: qty 10

## 2022-06-03 MED ORDER — BUSPIRONE HCL 5 MG PO TABS
7.5000 mg | ORAL_TABLET | Freq: Two times a day (BID) | ORAL | Status: DC
  Administered 2022-06-04 – 2022-06-07 (×4): 7.5 mg via ORAL
  Filled 2022-06-03 (×5): qty 2

## 2022-06-03 MED ORDER — SODIUM CHLORIDE 0.9 % IV SOLN: 1.0000 g | Freq: Every day | INTRAVENOUS | Status: DC

## 2022-06-03 MED ORDER — ASPIRIN 81 MG PO TBEC
81.0000 mg | DELAYED_RELEASE_TABLET | Freq: Every day | ORAL | Status: DC
  Administered 2022-06-05 – 2022-06-07 (×2): 81 mg via ORAL
  Filled 2022-06-03 (×2): qty 1

## 2022-06-03 MED ORDER — PIPERACILLIN-TAZOBACTAM 3.375 G IVPB
3.3750 g | Freq: Three times a day (TID) | INTRAVENOUS | Status: DC
  Administered 2022-06-03 – 2022-06-04 (×4): 3.375 g via INTRAVENOUS
  Filled 2022-06-03 (×4): qty 50

## 2022-06-03 MED ORDER — SODIUM CHLORIDE 0.9 % IV BOLUS
1000.0000 mL | Freq: Once | INTRAVENOUS | Status: AC
  Administered 2022-06-03: 1000 mL via INTRAVENOUS

## 2022-06-03 MED ORDER — MAGIC MOUTHWASH: 15.0000 mL | Freq: Four times a day (QID) | ORAL | Status: DC | PRN

## 2022-06-03 MED ORDER — ENOXAPARIN SODIUM 40 MG/0.4ML IJ SOSY
40.0000 mg | PREFILLED_SYRINGE | INTRAMUSCULAR | Status: DC
  Administered 2022-06-03 – 2022-06-06 (×4): 40 mg via SUBCUTANEOUS
  Filled 2022-06-03 (×4): qty 0.4

## 2022-06-03 MED ORDER — IOHEXOL 300 MG/ML  SOLN
100.0000 mL | Freq: Once | INTRAMUSCULAR | Status: AC | PRN
  Administered 2022-06-03: 100 mL via INTRAVENOUS

## 2022-06-03 MED ORDER — PHENOL 1.4 % MT LIQD: 2.0000 | OROMUCOSAL | Status: DC | PRN

## 2022-06-03 MED ORDER — SIMETHICONE 40 MG/0.6ML PO SUSP: 80.0000 mg | Freq: Four times a day (QID) | ORAL | Status: DC | PRN

## 2022-06-03 MED ORDER — ALUM & MAG HYDROXIDE-SIMETH 200-200-20 MG/5ML PO SUSP: 30.0000 mL | Freq: Four times a day (QID) | ORAL | Status: DC | PRN

## 2022-06-03 MED ORDER — LACTATED RINGERS IV BOLUS: 1000.0000 mL | Freq: Three times a day (TID) | INTRAVENOUS | Status: AC | PRN

## 2022-06-03 MED ORDER — LIP MEDEX EX OINT
TOPICAL_OINTMENT | Freq: Two times a day (BID) | CUTANEOUS | Status: DC
  Administered 2022-06-03 – 2022-06-07 (×7): 75 via TOPICAL
  Filled 2022-06-03 (×4): qty 7

## 2022-06-03 MED ORDER — HYDROMORPHONE HCL 1 MG/ML IJ SOLN
0.5000 mg | INTRAMUSCULAR | Status: DC | PRN
  Administered 2022-06-04 – 2022-06-07 (×5): 1 mg via INTRAVENOUS
  Filled 2022-06-03 (×5): qty 1

## 2022-06-03 MED ORDER — GADOBUTROL 1 MMOL/ML IV SOLN
8.0000 mL | Freq: Once | INTRAVENOUS | Status: AC | PRN
  Administered 2022-06-03: 8 mL via INTRAVENOUS

## 2022-06-03 MED ORDER — MORPHINE SULFATE (PF) 4 MG/ML IV SOLN
4.0000 mg | Freq: Once | INTRAVENOUS | Status: AC
  Administered 2022-06-03: 4 mg via INTRAVENOUS
  Filled 2022-06-03: qty 1

## 2022-06-03 MED ORDER — METRONIDAZOLE 500 MG/100ML IV SOLN
500.0000 mg | Freq: Once | INTRAVENOUS | Status: AC
  Administered 2022-06-03: 500 mg via INTRAVENOUS
  Filled 2022-06-03: qty 100

## 2022-06-03 MED ORDER — PANTOPRAZOLE SODIUM 40 MG PO TBEC
40.0000 mg | DELAYED_RELEASE_TABLET | Freq: Two times a day (BID) | ORAL | Status: DC
  Administered 2022-06-04 – 2022-06-07 (×6): 40 mg via ORAL
  Filled 2022-06-03 (×7): qty 1

## 2022-06-03 MED ORDER — SODIUM CHLORIDE 0.9 % IV SOLN: INTRAVENOUS | Status: DC

## 2022-06-03 MED ORDER — OXYCODONE HCL 5 MG PO TABS
5.0000 mg | ORAL_TABLET | ORAL | Status: DC | PRN
  Administered 2022-06-04 – 2022-06-07 (×4): 5 mg via ORAL
  Filled 2022-06-03 (×4): qty 1

## 2022-06-03 MED ORDER — METHOCARBAMOL 1000 MG/10ML IJ SOLN: 1000.0000 mg | Freq: Four times a day (QID) | INTRAVENOUS | Status: DC | PRN

## 2022-06-03 MED ORDER — SODIUM CHLORIDE 0.9 % IV SOLN: 8.0000 mg | Freq: Four times a day (QID) | INTRAVENOUS | Status: DC | PRN

## 2022-06-03 MED ORDER — CALCIUM POLYCARBOPHIL 625 MG PO TABS
625.0000 mg | ORAL_TABLET | Freq: Two times a day (BID) | ORAL | Status: DC
  Administered 2022-06-04 – 2022-06-07 (×6): 625 mg via ORAL
  Filled 2022-06-03 (×8): qty 1

## 2022-06-03 MED ORDER — BISACODYL 10 MG RE SUPP: 10.0000 mg | Freq: Two times a day (BID) | RECTAL | Status: DC | PRN

## 2022-06-03 MED ORDER — PROCHLORPERAZINE EDISYLATE 10 MG/2ML IJ SOLN
5.0000 mg | INTRAMUSCULAR | Status: DC | PRN
  Administered 2022-06-05: 10 mg via INTRAVENOUS
  Filled 2022-06-03: qty 2

## 2022-06-03 MED ORDER — MORPHINE SULFATE (PF) 2 MG/ML IV SOLN: 2.0000 mg | INTRAVENOUS | Status: DC | PRN

## 2022-06-03 MED ORDER — METOCLOPRAMIDE HCL 5 MG/ML IJ SOLN: 5.0000 mg | Freq: Three times a day (TID) | INTRAMUSCULAR | Status: DC | PRN

## 2022-06-03 MED ORDER — SODIUM CHLORIDE 0.9 % IV SOLN
2.0000 g | Freq: Once | INTRAVENOUS | Status: AC
  Administered 2022-06-03: 2 g via INTRAVENOUS
  Filled 2022-06-03: qty 20

## 2022-06-03 MED ORDER — METRONIDAZOLE 500 MG/100ML IV SOLN: 500.0000 mg | Freq: Two times a day (BID) | INTRAVENOUS | Status: DC

## 2022-06-03 MED ORDER — SODIUM CHLORIDE 0.9 % IV SOLN: 2.0000 g | Freq: Every day | INTRAVENOUS | Status: DC

## 2022-06-03 MED ORDER — POLYETHYLENE GLYCOL 3350 17 G PO PACK: 17.0000 g | PACK | Freq: Two times a day (BID) | ORAL | Status: DC | PRN

## 2022-06-03 MED ORDER — INSULIN ASPART 100 UNIT/ML IJ SOLN
0.0000 [IU] | Freq: Three times a day (TID) | INTRAMUSCULAR | Status: DC
  Administered 2022-06-04 – 2022-06-05 (×3): 1 [IU] via SUBCUTANEOUS

## 2022-06-03 MED ORDER — MENTHOL 3 MG MT LOZG: 1.0000 | LOZENGE | OROMUCOSAL | Status: DC | PRN

## 2022-06-03 MED ORDER — BUSPIRONE HCL 5 MG PO TABS
5.0000 mg | ORAL_TABLET | Freq: Every day | ORAL | Status: DC
  Administered 2022-06-04 – 2022-06-06 (×3): 5 mg via ORAL
  Filled 2022-06-03 (×3): qty 1

## 2022-06-03 MED ORDER — METRONIDAZOLE 500 MG/100ML IV SOLN: 500.0000 mg | Freq: Three times a day (TID) | INTRAVENOUS | Status: DC

## 2022-06-03 NOTE — ED Notes (Signed)
Report given to RN Shanice at the floor.

## 2022-06-03 NOTE — ED Notes (Signed)
Report given to RN Lonnie at CL.

## 2022-06-03 NOTE — Consult Note (Signed)
Jeremy Ryan  [birth year] [id number]  CARE TEAM:  PCP: Olive Bass, FNP  Outpatient Care Team: Patient Care Team: Olive Bass, FNP as PCP - General (Internal Medicine) [name], MD (Gastroenterology) Nile Dear, DO as Referring Physician (Endocrinology) Diamond Nickel., MD as Referring Physician (Cardiology)  Inpatient Treatment Team: Treatment Team: Attending Provider: Burnadette Pop, MD; Rounding Team: Arlean Hopping, MD; Consulting Physician: Kerin Salen, MD; Consulting Physician: Montez Morita, Md, MD   This patient is a 67 y.o.male who presents today for surgical evaluation at the request of Dr. Renford Dills.   Chief complaint / Reason for evaluation: Elevated liver function tests and gallstones.  Patient with multiple medical problems.  Worsening nausea with vomiting chills and dizziness.  Felt like he was congested with a cough and went through a whole bottle of Mucinex.  Noticed his urine getting darker.  Manson Passey.  Felt more tired and lethargic.  Felt lightheaded and worse through the night.  Came to emergency department.  Found to have elevated liver function tests and white count.  Concern for cholangitis with possible cholecystitis and common bile duct stones.  Medicine admission.  Surgery and gastrology consultations.  Neurology wants MRCP.  Patient's blood pressure hypotensive but fluid responsive.  Pain requiring IV morphine with some relief.   Patient currently status post prior stenting.  History of atrial fibrillation.  He is on anticoagulation with aspirin and Plavix.  He takes diuretics as well.  History of GERD and esophageal stricture status post prior dilatations.   Assessment  Jeremy Ryan  67 y.o. male       Problem List:  Principal Problem:   Ascending cholangitis Active Problems:   CAD in native artery   Essential hypertension   Gastro-esophageal reflux disease without esophagitis   Paroxysmal atrial fibrillation (HCC)    Type 2 diabetes mellitus without complication, without long-term current use of insulin (HCC)   Hyperbilirubinemia   Chronic nonalcoholic liver disease   History of esophageal stricture   Nausea vomiting abdominal pain with leukocytosis and markedly elevated LFTs with dilated bile duct suspicious for choledocholithiasis and cholangitis.  Plan:  IV antibiotics.  I would do piperacillin/tazobactam for better coverage of her ceftriaxone.  Nausea and pain control.  IV fluid resuscitation.  Awaiting gastroenterology consultation.  Elevated LFTs and total bilirubin 5.7, most likely patient will need ERCP.  They want an MRCP to confirm.  Once ERCP done, we can consider cholecystectomy prior to discharge.  Would need medical clearance given his numerous issues.  Hold anticoagulation.  Ideally would wait 4 days from last Plavix dose before considering interval cholecystectomy.  If more rapidly declines, may need percutaneous drainage of the gallbladder as a less risky means to control infection and potential sepsis.  We will see    I reviewed nursing notes, ED provider notes, hospitalist notes, last 24 h vitals and pain scores, last 48 h intake and output, last 24 h labs and trends, and last 24 h imaging results. I have reviewed this patient's available data, including medical history, events of note, test results, etc as part of my evaluation.  A significant portion of that time was spent in counseling.  Care during the described time interval was provided by me.  This care required moderate level of medical decision making.  06/03/2022  Ardeth Sportsman, MD, FACS, MASCRS Esophageal, Gastrointestinal & Colorectal Surgery Robotic and Minimally Invasive Surgery  [facility] [address]  [address] [place], [place] [address] [phone] Fax [phone] Main  CONTACT INFORMATION:  Weekday (9AM-5PM): Call [facility] main office at  [phone]  Weeknight (5PM-9AM) or Weekend/Holiday: Check [email or web address] (password " [email or web address]") for General Surgery [facility] coverage  (Please, do not use SecureChat as it is not reliable communication to reach operating surgeons for immediate patient care given surgeries/outpatient duties/clinic/cross-coverage/off post-call which would lead to a delay in care.  Epic staff messaging available for outptient concerns, but may not be answered for 48 hours or more).     06/03/2022      Past Medical History:  Diagnosis Date   Colon polyps    Coronary artery disease    History of blood clots    Hypertension     Past Surgical History:  Procedure Laterality Date   CORONARY STENT INTERVENTION      Social History   Socioeconomic History   Marital status: Married    Spouse name: Not on file   Number of children: Not on file   Years of education: Not on file   Highest education level: Not on file  Occupational History   Not on file  Tobacco Use   Smoking status: Former   Smokeless tobacco: Never  Vaping Use   Vaping Use: Never used  Substance and Sexual Activity   Alcohol use: Never   Drug use: Never   Sexual activity: Not on file  Other Topics Concern   Not on file  Social History Narrative   Not on file   Social Determinants of Health   Financial Resource Strain: Low Risk  (11/04/2021)   Overall Financial Resource Strain (CARDIA)    Difficulty of Paying Living Expenses: Not hard at all  Food Insecurity: No Food Insecurity (11/04/2021)   Hunger Vital Sign    Worried About Running Out of Food in the Last Year: Never true    Ran Out of Food in the Last Year: Never true  Transportation Needs: No Transportation Needs (11/04/2021)   PRAPARE - Administrator, Civil Service (Medical): No    Lack of Transportation (Non-Medical): No  Physical Activity: Inactive (11/04/2021)   Exercise Vital Sign    Days of Exercise per Week: 0 days    Minutes of Exercise per Session: 0 min   Stress: No Stress Concern Present (11/04/2021)   [employer] of Occupational Health - Occupational Stress Questionnaire    Feeling of Stress : Not at all  Social Connections: Moderately Integrated (11/04/2021)   Social Connection and Isolation Panel [NHANES]    Frequency of Communication with Friends and Family: More than three times a week    Frequency of Social Gatherings with Friends and Family: Twice a week    Attends Religious Services: More than 4 times per year    Active Member of [employer] or Organizations: No    Attends Banker Meetings: Never    Marital Status: Married  Catering manager Violence: Not At Risk (11/04/2021)   Humiliation, Afraid, Rape, and Kick questionnaire    Fear of Current or Ex-Partner: No    Emotionally Abused: No    Physically Abused: No    Sexually Abused: No    History reviewed. No pertinent family history.  Current Facility-Administered Medications  Medication Dose Route Frequency Provider Last Rate Last Admin   0.9 %  sodium chloride infusion   Intravenous Continuous Adhikari, Amrit, MD       alum & mag hydroxide-simeth (MAALOX/MYLANTA) 200-200-20 MG/5ML suspension 30 mL  30 mL Oral Q6H PRN Karie Soda, MD       aspirin EC tablet 81 mg  81 mg Oral Daily Adhikari, Amrit, MD       bisacodyl (DULCOLAX) suppository 10 mg  10 mg Rectal Q12H PRN Karie Soda, MD       busPIRone (BUSPAR) tablet 5 mg  5 mg Oral QHS Adhikari, Amrit, MD       busPIRone (BUSPAR) tablet 7.5 mg  7.5 mg Oral BID Burnadette Pop, MD       enoxaparin (LOVENOX) injection 40 mg  40 mg Subcutaneous Q24H Adhikari, Amrit, MD       HYDROmorphone (DILAUDID) injection 0.5-2 mg  0.5-2 mg Intravenous Q2H PRN Karie Soda, MD       Melene Muller ON 06/04/2022] insulin aspart (novoLOG) injection 0-9 Units  0-9 Units Subcutaneous TID WC Adhikari, Amrit, MD       lactated ringers bolus 1,000 mL  1,000 mL Intravenous Q8H PRN Tenasia Aull, Viviann Spare, MD       lip balm (CARMEX) ointment   Topical  BID Karie Soda, MD       magic mouthwash  15 mL Oral QID PRN Karie Soda, MD       menthol-cetylpyridinium (CEPACOL) lozenge 3 mg  1 lozenge Oral PRN Karie Soda, MD       methocarbamol (ROBAXIN) 1,000 mg in dextrose 5 % 100 mL IVPB  1,000 mg Intravenous Q6H PRN Karie Soda, MD       metoCLOPramide (REGLAN) injection 5-10 mg  5-10 mg Intravenous Q8H PRN Karie Soda, MD       ondansetron [facility]) injection 4 mg  4 mg Intravenous Q6H PRN Karie Soda, MD       Or   ondansetron (ZOFRAN) 8 mg in sodium chloride 0.9 % 50 mL IVPB  8 mg Intravenous Q6H PRN Karie Soda, MD       oxyCODONE (Oxy IR/ROXICODONE) immediate release tablet 5 mg  5 mg Oral Q4H PRN Burnadette Pop, MD       pantoprazole (PROTONIX) EC tablet 40 mg  40 mg Oral BID Adhikari, Amrit, MD       phenol (CHLORASEPTIC) mouth spray 2 spray  2 spray Mouth/Throat PRN Karie Soda, MD       piperacillin-tazobactam (ZOSYN) IVPB 3.375 g  3.375 g Intravenous Trixie Deis, MD       polycarbophil (FIBERCON) tablet 625 mg  625 mg Oral BID Karie Soda, MD       polyethylene glycol (MIRALAX / GLYCOLAX) packet 17 g  17 g Oral Q12H PRN Karie Soda, MD       prochlorperazine (COMPAZINE) injection 5-10 mg  5-10 mg Intravenous Q4H PRN Karie Soda, MD       simethicone (MYLICON) 40 MG/0.6ML suspension 80 mg  80 mg Oral QID PRN Karie Soda, MD         No Known Allergies   BP (!) 142/73 (BP Location: Right Arm)   Pulse 63   Temp 98.5 F (36.9 C) (Oral)   Resp 20   Ht 5\' 10"  (1.778 m)   Wt 86.8 kg   SpO2 99%   BMI 27.45 kg/m     Results:   Labs: Results for orders placed or performed during the hospital encounter of 06/03/22 (from the past 48 hour(s))  Comprehensive metabolic panel     Status: Abnormal   Collection Time: 06/03/22 12:11 PM  Result Value Ref Range   Sodium 134 (L) 135 - 145 mmol/L   Potassium  3.7 3.5 - 5.1 mmol/L   Chloride 101 98 - 111 mmol/L   CO2 24 22 - 32 mmol/L   Glucose, Bld 134 (H) 70  - 99 mg/dL    Comment: Glucose reference range applies only to samples taken after fasting for at least 8 hours.   BUN 27 (H) 8 - 23 mg/dL   Creatinine, Ser 4.78 0.61 - 1.24 mg/dL   Calcium 9.0 8.9 - 29.5 mg/dL   Total Protein 7.4 6.5 - 8.1 g/dL   Albumin 3.4 (L) 3.5 - 5.0 g/dL   AST 621 (H) 15 - 41 U/L   ALT 279 (H) 0 - 44 U/L   Alkaline Phosphatase 577 (H) 38 - 126 U/L   Total Bilirubin 5.7 (H) 0.3 - 1.2 mg/dL   GFR, Estimated >30 >86 mL/min    Comment: (NOTE) Calculated using the CKD-EPI Creatinine Equation (2021)    Anion gap 9 5 - 15    Comment: Performed at [facility], [address] [place], [place] [address]  CBC with Differential     Status: Abnormal   Collection Time: 06/03/22 12:11 PM  Result Value Ref Range   WBC 14.4 (H) 4.0 - 10.5 K/uL   RBC 5.20 4.22 - 5.81 MIL/uL   Hemoglobin 14.3 13.0 - 17.0 g/dL   HCT 96.2 95.2 - 84.1 %   MCV 83.7 80.0 - 100.0 fL   MCH 27.5 26.0 - 34.0 pg   MCHC 32.9 30.0 - 36.0 g/dL   RDW 32.4 40.1 - 02.7 %   Platelets 220 150 - 400 K/uL   nRBC 0.0 0.0 - 0.2 %   Neutrophils Relative % 94 %   Neutro Abs 13.5 (H) 1.7 - 7.7 K/uL   Lymphocytes Relative 2 %   Lymphs Abs 0.3 (L) 0.7 - 4.0 K/uL   Monocytes Relative 4 %   Monocytes Absolute 0.6 0.1 - 1.0 K/uL   Eosinophils Relative 0 %   Eosinophils Absolute 0.0 0.0 - 0.5 K/uL   Basophils Relative 0 %   Basophils Absolute 0.0 0.0 - 0.1 K/uL   Immature Granulocytes 0 %   Abs Immature Granulocytes 0.04 0.00 - 0.07 K/uL    Comment: Performed at [facility], [address] [place], [place] [address]  Troponin I (High Sensitivity)     Status: None   Collection Time: 06/03/22 12:11 PM  Result Value Ref Range   Troponin I (High Sensitivity) 6 <18 ng/L    Comment: (NOTE) Elevated high sensitivity troponin I (hsTnI) values and significant  changes across serial measurements may suggest ACS but many other  chronic and acute conditions are known to elevate hsTnI results.   Refer to the "Links" section for chest pain algorithms and additional  guidance. Performed at [facility], [address] [place], [place] [address]   Lipase, blood     Status: None   Collection Time: 06/03/22 12:11 PM  Result Value Ref Range   Lipase 28 11 - 51 U/L    Comment: Performed at [facility], [address] [place], [place] [address]  Lactic acid, plasma     Status: None   Collection Time: 06/03/22 12:11 PM  Result Value Ref Range   Lactic Acid, Venous 1.6 0.5 - 1.9 mmol/L    Comment: Performed at [facility], [address] [place], [place] [address]  Urinalysis, Routine w reflex microscopic -Urine, Clean Catch  Status: Abnormal   Collection Time: 06/03/22 12:11 PM  Result Value Ref Range   Color, Urine AMBER (A) YELLOW    Comment: BIOCHEMICALS MAY BE AFFECTED BY COLOR   APPearance CLEAR CLEAR   Specific Gravity, Urine 1.010 1.005 - 1.030   pH 7.0 5.0 - 8.0   Glucose, UA >=500 (A) NEGATIVE mg/dL   Hgb urine dipstick NEGATIVE NEGATIVE   Bilirubin Urine LARGE (A) NEGATIVE   Ketones, ur NEGATIVE NEGATIVE mg/dL   Protein, ur NEGATIVE NEGATIVE mg/dL   Nitrite NEGATIVE NEGATIVE   Leukocytes,Ua NEGATIVE NEGATIVE    Comment: Performed at [facility], [address] [place], [place] [address]  SARS Coronavirus 2 by RT PCR (hospital order, performed in [facility] hospital lab) *cepheid single result test* Anterior Nasal Swab     Status: None   Collection Time: 06/03/22 12:11 PM   Specimen: Anterior Nasal Swab  Result Value Ref Range   SARS Coronavirus 2 by RT PCR NEGATIVE NEGATIVE    Comment: (NOTE) SARS-CoV-2 target nucleic acids are NOT DETECTED.  The SARS-CoV-2 RNA is generally detectable in upper and lower respiratory specimens during the acute phase of infection. The lowest concentration of SARS-CoV-2 viral copies this assay can detect is 250 copies / mL. A negative result does not preclude SARS-CoV-2  infection and should not be used as the sole basis for treatment or other patient management decisions.  A negative result may occur with improper specimen collection / handling, submission of specimen other than nasopharyngeal swab, presence of viral mutation(s) within the areas targeted by this assay, and inadequate number of viral copies (<250 copies / mL). A negative result must be combined with clinical observations, patient history, and epidemiological information.  Fact Sheet for Patients:   RoadLapTop.co.za  Fact Sheet for Healthcare Providers: http://kim-miller.com/  This test is not yet approved or  cleared by the [place] FDA and has been authorized for detection and/or diagnosis of SARS-CoV-2 by FDA under an Emergency Use Authorization (EUA).  This EUA will remain in effect (meaning this test can be used) for the duration of the COVID-19 declaration under Section 564(b)(1) of the Act, 21 U.S.C. section 360bbb-3(b)(1), unless the authorization is terminated or revoked sooner.  Performed at [facility], [address] [place], [place] [address]   Urinalysis, Microscopic (reflex)     Status: Abnormal   Collection Time: 06/03/22 12:11 PM  Result Value Ref Range   RBC / HPF NONE SEEN 0 - 5 RBC/hpf   WBC, UA 0-5 0 - 5 WBC/hpf   Bacteria, UA RARE (A) NONE SEEN   Squamous Epithelial / HPF 0-5 0 - 5 /HPF    Comment: Performed at [facility], [address] [place], [place] [address]    Imaging / Studies: CT ABDOMEN PELVIS W CONTRAST  Result Date: 06/03/2022 CLINICAL DATA:  Sepsis concern for potential biliary duct obstruction or disease - n/v abdominal pain and fevers EXAM: CT ABDOMEN AND PELVIS WITH CONTRAST TECHNIQUE: Multidetector CT imaging of the abdomen and pelvis was performed using the standard protocol following bolus administration of intravenous contrast. RADIATION DOSE REDUCTION: This exam  was performed according to the departmental dose-optimization program which includes automated exposure control, adjustment of the mA and/or kV according to patient size and/or use of iterative reconstruction technique. CONTRAST:  OMNIPAQUE IOHEXOL 300 MG/ML  SOLN COMPARISON:  March 31, 2022, November 16, 2011 FINDINGS: Lower chest: No acute abnormality. Hepatobiliary: No  new focal hepatic lesion. The gallbladder is distended with gallbladder wall thickening. No discrete adjacent fat stranding. However, there is mucosal enhancement throughout the gallbladder lumen as well as the common bile duct (series 5, image 60). Common bile duct is enlarged and measures 13 mm, previously 7 mm in 2022. No discrete choledocholithiasis are visualized. Mild central biliary ductal prominence, new since 2022. Pancreas: Unremarkable. No pancreatic ductal dilatation or surrounding inflammatory changes. Spleen: Revisualization of a peripherally enhancing mass within the lateral spleen, present since 2013 and most consistent with a benign hemangioma. Adrenals/Urinary Tract: Adrenal glands are unremarkable. Kidneys enhance symmetrically. No hydronephrosis. No obstructing nephrolithiasis. Bladder is unremarkable for degree of distension. Stomach/Bowel: No evidence of bowel obstruction. Moderate to large colonic stool burden throughout the colon. Duodenal diverticulum. Appendix is normal. Vascular/Lymphatic: Atherosclerotic calcifications of the nonaneurysmal abdominal aorta. No new lymphadenopathy. Reproductive: Prostatomegaly. Other: No free air or free fluid. Musculoskeletal: No acute or significant osseous findings. IMPRESSION: 1. There is new mild gallbladder wall thickening and increased mucosal enhancement throughout the gallbladder lumen as well as the common bile duct. Common bile duct is enlarged and measures 13 mm, previously 7 mm in 2022. No discrete choledocholithiasis are visualized. Given reported clinical history of  sepsis, findings are concerning for ascending cholangitis of uncertain etiology. 2. Moderate to large colonic stool burden throughout the colon. Aortic Atherosclerosis (ICD10-I70.0). Electronically Signed   By: Meda Klinefelter M.D.   On: 06/03/2022 13:39   DG Chest 2 View  Result Date: 06/03/2022 CLINICAL DATA:  Dizziness.  Nausea vomiting and chills. EXAM: CHEST - 2 VIEW COMPARISON:  CTA chest, 02/19/2020. FINDINGS: Cardiac silhouette normal in size and configuration. Normal mediastinal and hilar contours. Clear lungs.  No pleural effusion or pneumothorax. Skeletal structures are intact. IMPRESSION: No active cardiopulmonary disease. Electronically Signed   By: Amie Portland M.D.   On: 06/03/2022 12:04    Medications / Allergies: per chart  Antibiotics: Anti-infectives (From admission, onward)    Start     Dose/Rate Route Frequency Ordered Stop   06/04/22 1000  cefTRIAXone (ROCEPHIN) 1 g in sodium chloride 0.9 % 100 mL IVPB  Status:  Discontinued        1 g 200 mL/hr over 30 Minutes Intravenous Daily 06/03/22 1458 06/03/22 1717   06/04/22 1000  cefTRIAXone (ROCEPHIN) 2 g in sodium chloride 0.9 % 100 mL IVPB  Status:  Discontinued        2 g 200 mL/hr over 30 Minutes Intravenous Daily 06/03/22 1718 06/03/22 1803   06/03/22 2200  metroNIDAZOLE (FLAGYL) IVPB 500 mg  Status:  Discontinued        500 mg 100 mL/hr over 60 Minutes Intravenous Every 12 hours 06/03/22 1717 06/03/22 1803   06/03/22 2100  metroNIDAZOLE (FLAGYL) IVPB 500 mg  Status:  Discontinued        500 mg 100 mL/hr over 60 Minutes Intravenous Every 8 hours 06/03/22 1458 06/03/22 1716   06/03/22 1900  piperacillin-tazobactam (ZOSYN) IVPB 3.375 g        3.375 g 12.5 mL/hr over 240 Minutes Intravenous Every 8 hours 06/03/22 1803 06/08/22 2159   06/03/22 1400  cefTRIAXone (ROCEPHIN) 2 g in sodium chloride 0.9 % 100 mL IVPB        2 g 200 mL/hr over 30 Minutes Intravenous  Once 06/03/22 1349 06/03/22 1502   06/03/22 1400   metroNIDAZOLE (FLAGYL) IVPB 500 mg        500 mg 100 mL/hr over 60 Minutes Intravenous  Once 06/03/22 1349 06/03/22 1536         Note: Portions of this report may have been transcribed using voice recognition software. Every effort was made to ensure accuracy; however, inadvertent computerized transcription errors may be present.   Any transcriptional errors that result from this process are unintentional.    Ardeth Sportsman, MD, FACS, MASCRS Esophageal, Gastrointestinal & Colorectal Surgery Robotic and Minimally Invasive Surgery  [facility] [address], [address] [place], [place] [address] [phone] Fax [phone] Main  CONTACT INFORMATION:  Weekday (9AM-5PM): Call [facility] main office at [phone]  Weeknight (5PM-9AM) or Weekend/Holiday: Check [email or web address] (password " [email or web address]") for General Surgery [facility] coverage  (Please, do not use SecureChat as it is not reliable communication to reach operating surgeons for immediate patient care given surgeries/outpatient duties/clinic/cross-coverage/off post-call which would lead to a delay in care.  Epic staff messaging available for outptient concerns, but may not be answered for 48 hours or more).      06/03/2022  6:10 PM

## 2022-06-03 NOTE — ED Notes (Signed)
Pt endorsed consuming an entire bottle of Mucinex over the previous week due to constant drainage and congestion. When asked, advised he did not consume any extra water with the dosage, or during the day. He endorsed his urine turning darker and darker brown throughout the week. Denied flank or back pain. Complained of lethargy throughout the week.

## 2022-06-03 NOTE — H&P (Signed)
History and Physical    DEMETRUS WITHERSPOON NWG:[id number] DOB: [birth year] DOA: 06/03/2022  PCP: Olive Bass, FNP   Patient coming from: Home  Chief Complaint:   HPI: Jeremy Ryan is a 67 y.o. male with medical history significant of hypertension, coronary artery disease, diabetes type 2, paroxysmal A-fib who presented to the emergency department with complaint of dizziness, night sweats, chills almost 69-month.  Patient was having severe chills yesterday with shaking uncontrollably, became nauseated and vomited and also had loose bowel movements last night.  Also complained of sharp intermittent epigastric abdominal pain.  This morning, he felt extremely lightheaded and dizzy and when he measured the blood pressure at home it was low with systolic blood pressure in the range of 80s. On presentation to the emergency room, he was noted to have soft blood pressure.  He was afebrile.   Patient seen and examined at bedside on the floor today.  During my evaluation he was hemodynamically stable. His wife were present at the bedside.  He felt better after he got a dose of morphine.  He rated the pain as 3 / 10 during my evaluation.  There was no report of fever, chest pain, shortness of breath, palpitations, loss of consciousness, headache, hematochezia or melena.  ED Course: Lab work showed WC count of 14.4, alkaline phosphatase 577, AST of 300, ALT of 279.  Chest x-ray did not show any acute findings.  CT abdomen suspected showed  new mild gallbladder wall thickening and increased mucosal enhancement throughout the gallbladder lumen as well as the common bile duct,CBD of 13 mm.  Findings suspicious for ascending cholangitis .  Patient was started on broad-spectrum antibiotics.  Blood culture sent. case was discussed with GI , Dr. Marca Ancona who recommended MRCP. General surgery consulted, discussed with Dr. Michaell Cowing    Review of Systems: As per HPI otherwise 10 point review of systems negative.     Past Medical History:  Diagnosis Date   Colon polyps    Coronary artery disease    History of blood clots    Hypertension     Past Surgical History:  Procedure Laterality Date   CORONARY STENT INTERVENTION       reports that he has quit smoking. He has never used smokeless tobacco. He reports that he does not drink alcohol and does not use drugs.  No Known Allergies  History reviewed. No pertinent family history.   Prior to Admission medications   Medication Sig Start Date End Date Taking? Authorizing Provider  aspirin EC 81 MG tablet Take 81 mg by mouth daily. Swallow whole.    [provider]  atorvastatin (LIPITOR) 80 MG tablet Take 1 tablet by mouth daily. 01/12/21   [provider]  busPIRone (BUSPAR) 5 MG tablet Take 5 mg by mouth at bedtime. 06/10/21   [provider]  busPIRone (BUSPAR) 7.5 MG tablet Take 1 in the am and 1 at lunchtime as directed 04/06/22   Olive Bass, FNP  clopidogrel (PLAVIX) 75 MG tablet Take 75 mg by mouth daily. 06/23/21   [provider]  dicyclomine (BENTYL) 20 MG tablet Take 1 tablet (20 mg total) by mouth 4 (four) times daily. 05/08/22   Olive Bass, FNP  furosemide (LASIX) 20 MG tablet SMARTSIG:1-2 Tablet(s) By Mouth 06/10/21   [provider]  isosorbide mononitrate (IMDUR) 30 MG 24 hr tablet Take 30 mg by mouth 2 (two) times daily. 03/03/21   [provider]  JARDIANCE 25  MG TABS tablet Take 25 mg by mouth daily. 10/04/21   [provider]  losartan (COZAAR) 50 MG tablet TAKE 1 TABLET BY MOUTH EVERY DAY 12/12/21   Olive Bass, FNP  pantoprazole (PROTONIX) 40 MG tablet TAKE 1 TABLET (40 MG TOTAL) BY MOUTH TWICE A DAY BEFORE MEALS 04/12/22   Olive Bass, FNP  propranolol (INDERAL) 40 MG tablet Take 1 tablet (40 mg total) by mouth 3 (three) times daily. 04/05/22   Olive Bass, FNP    Physical Exam: Vitals:   06/03/22 1600 06/03/22 1607  06/03/22 1607 06/03/22 1709  BP: (!) 154/86   (!) 142/73  Pulse:  63  63  Resp:   16 20  Temp:   98.8 F (37.1 C) 98.5 F (36.9 C)  TempSrc:   Oral Oral  SpO2:  99%  99%  Weight:    86.8 kg  Height:    5\' 10"  (1.778 m)    Constitutional: Overall comfortable, pleasant gentleman Vitals:   06/03/22 1600 06/03/22 1607 06/03/22 1607 06/03/22 1709  BP: (!) 154/86   (!) 142/73  Pulse:  63  63  Resp:   16 20  Temp:   98.8 F (37.1 C) 98.5 F (36.9 C)  TempSrc:   Oral Oral  SpO2:  99%  99%  Weight:    86.8 kg  Height:    5\' 10"  (1.778 m)   Eyes: PERRL, lids and conjunctivae normal ENMT: Mucous membranes are moist.  Neck: normal, supple, no masses, no thyromegaly Respiratory: clear to auscultation bilaterally, no wheezing, no crackles. Normal respiratory effort. No accessory muscle use.  Cardiovascular: Regular rate and rhythm, no murmurs / rubs / gallops. No extremity edema.  Abdomen: Soft, nondistended, mild tenderness in the right upper quadrant and epigastrium Musculoskeletal: no clubbing / cyanosis. No joint deformity upper and lower extremities.  Skin: no rashes, lesions, ulcers. No induration Neurologic: CN 2-12 grossly intact.  Strength 5/5 in all 4.  Psychiatric: Normal judgment and insight. Alert and oriented x 3. Normal mood.   Foley Catheter:None  Labs on Admission: I have personally reviewed following labs and imaging studies  CBC: Recent Labs  Lab 06/03/22 1211  WBC 14.4*  NEUTROABS 13.5*  HGB 14.3  HCT 43.5  MCV 83.7  PLT 220   Basic Metabolic Panel: Recent Labs  Lab 06/03/22 1211  NA 134*  K 3.7  CL 101  CO2 24  GLUCOSE 134*  BUN 27*  CREATININE 0.92  CALCIUM 9.0   GFR: Estimated Creatinine Clearance: 79.3 mL/min (by C-G formula based on SCr of 0.92 mg/dL). Liver Function Tests: Recent Labs  Lab 06/03/22 1211  AST 300*  ALT 279*  ALKPHOS 577*  BILITOT 5.7*  PROT 7.4  ALBUMIN 3.4*   Recent Labs  Lab 06/03/22 1211  LIPASE 28   No  results for input(s): "AMMONIA" in the last 168 hours. Coagulation Profile: No results for input(s): "INR", "PROTIME" in the last 168 hours. Cardiac Enzymes: No results for input(s): "CKTOTAL", "CKMB", "CKMBINDEX", "TROPONINI" in the last 168 hours. BNP (last 3 results) No results for input(s): "PROBNP" in the last 8760 hours. HbA1C: No results for input(s): "HGBA1C" in the last 72 hours. CBG: No results for input(s): "GLUCAP" in the last 168 hours. Lipid Profile: No results for input(s): "CHOL", "HDL", "LDLCALC", "TRIG", "CHOLHDL", "LDLDIRECT" in the last 72 hours. Thyroid Function Tests: No results for input(s): "TSH", "T4TOTAL", "FREET4", "T3FREE", "THYROIDAB" in the last 72 hours. Anemia Panel: No results for  input(s): "VITAMINB12", "FOLATE", "FERRITIN", "TIBC", "IRON", "RETICCTPCT" in the last 72 hours. Urine analysis:    Component Value Date/Time   COLORURINE AMBER (A) 06/03/2022 1211   APPEARANCEUR CLEAR 06/03/2022 1211   LABSPEC 1.010 06/03/2022 1211   PHURINE 7.0 06/03/2022 1211   GLUCOSEU >=500 (A) 06/03/2022 1211   HGBUR NEGATIVE 06/03/2022 1211   BILIRUBINUR LARGE (A) 06/03/2022 1211   KETONESUR NEGATIVE 06/03/2022 1211   PROTEINUR NEGATIVE 06/03/2022 1211   NITRITE NEGATIVE 06/03/2022 1211   LEUKOCYTESUR NEGATIVE 06/03/2022 1211    Radiological Exams on Admission: CT ABDOMEN PELVIS W CONTRAST  Result Date: 06/03/2022 CLINICAL DATA:  Sepsis concern for potential biliary duct obstruction or disease - n/v abdominal pain and fevers EXAM: CT ABDOMEN AND PELVIS WITH CONTRAST TECHNIQUE: Multidetector CT imaging of the abdomen and pelvis was performed using the standard protocol following bolus administration of intravenous contrast. RADIATION DOSE REDUCTION: This exam was performed according to the departmental dose-optimization program which includes automated exposure control, adjustment of the mA and/or kV according to patient size and/or use of iterative reconstruction  technique. CONTRAST:  OMNIPAQUE IOHEXOL 300 MG/ML  SOLN COMPARISON:  March 31, 2022, November 16, 2011 FINDINGS: Lower chest: No acute abnormality. Hepatobiliary: No new focal hepatic lesion. The gallbladder is distended with gallbladder wall thickening. No discrete adjacent fat stranding. However, there is mucosal enhancement throughout the gallbladder lumen as well as the common bile duct (series 5, image 60). Common bile duct is enlarged and measures 13 mm, previously 7 mm in 2022. No discrete choledocholithiasis are visualized. Mild central biliary ductal prominence, new since 2022. Pancreas: Unremarkable. No pancreatic ductal dilatation or surrounding inflammatory changes. Spleen: Revisualization of a peripherally enhancing mass within the lateral spleen, present since 2013 and most consistent with a benign hemangioma. Adrenals/Urinary Tract: Adrenal glands are unremarkable. Kidneys enhance symmetrically. No hydronephrosis. No obstructing nephrolithiasis. Bladder is unremarkable for degree of distension. Stomach/Bowel: No evidence of bowel obstruction. Moderate to large colonic stool burden throughout the colon. Duodenal diverticulum. Appendix is normal. Vascular/Lymphatic: Atherosclerotic calcifications of the nonaneurysmal abdominal aorta. No new lymphadenopathy. Reproductive: Prostatomegaly. Other: No free air or free fluid. Musculoskeletal: No acute or significant osseous findings. IMPRESSION: 1. There is new mild gallbladder wall thickening and increased mucosal enhancement throughout the gallbladder lumen as well as the common bile duct. Common bile duct is enlarged and measures 13 mm, previously 7 mm in 2022. No discrete choledocholithiasis are visualized. Given reported clinical history of sepsis, findings are concerning for ascending cholangitis of uncertain etiology. 2. Moderate to large colonic stool burden throughout the colon. Aortic Atherosclerosis (ICD10-I70.0). Electronically Signed   By:  Meda Klinefelter M.D.   On: 06/03/2022 13:39   DG Chest 2 View  Result Date: 06/03/2022 CLINICAL DATA:  Dizziness.  Nausea vomiting and chills. EXAM: CHEST - 2 VIEW COMPARISON:  CTA chest, 02/19/2020. FINDINGS: Cardiac silhouette normal in size and configuration. Normal mediastinal and hilar contours. Clear lungs.  No pleural effusion or pneumothorax. Skeletal structures are intact. IMPRESSION: No active cardiopulmonary disease. Electronically Signed   By: Amie Portland M.D.   On: 06/03/2022 12:04     Assessment/Plan Principal Problem:   Cholangitis Active Problems:   CAD in native artery   Essential hypertension   Gastro-esophageal reflux disease without esophagitis   Paroxysmal atrial fibrillation (HCC)   Type 2 diabetes mellitus without complication, without long-term current use of insulin (HCC)  Cholangitis: Presented with chills, leukocytosis, elevated liver enzymes.  CT abdomen/pelvis showed gallbladder distention, gallbladder wall  thickening, dilated CBD.  No clear cholelithiasis seen. Findings were worrisome for ascending cholangitis.  Started on broad-spectrum antibiotics.  Currently on ceftriaxone and Flagyl.  Blood cultures have been collected, will follow-up.  Case was discussed with GI who recommended MRCP. We have also consulted  general surgery.he may need cholecystectomy. Continue pain management, supportive care, IV fluids, antiemetics. Patient does not have any anginal symptoms.  I do not believe that he needs further cardiac test if general surgery plans  for cholecystectomy.  He is considered low to moderate risk for this procedure  Coronary artery disease: Denies anginal symptoms.  Follows with cardiology at [facility].  Takes aspirin, Plavix.  Will hold Plavix for possible upcoming procedure.  Continue aspirin for now.  Type 2 diabetes: Takes Jardiance at home.  Currently on sliding scale insulin.  Monitor blood sugars  Hypertension: Takes Imdur, losartan , propranolol  at home.  Was hypotensive on presentation.  Antihypertensives on hold.  Monitor blood pressure.  Paroxysmal A-fib: Currently in normal sinus rhythm.  Does not take any anticoagulant.  Only on aspirin and Plavix at home.  Med rec shows he takes propranolol but patient is unsure  History of hyperlipidemia: On Lipitor.  Will  hold due to elevated liver enzymes  History of PUD/GERD: Takes Protonix at home.  Continue PPI    DVT prophylaxis:Lovenox  Code Status: Full Family Communication: Wife at bedside Consults called: General surgery, GI     Burnadette Pop MD [facility]  06/03/2022, 5:30 PM

## 2022-06-03 NOTE — ED Triage Notes (Signed)
Pt woke with dizziness around 0800; he had previously had NV and chills around midnight; wife says BP was 80s/40s at home; denies nausea at present

## 2022-06-03 NOTE — ED Notes (Signed)
Laying flat for [employer]

## 2022-06-03 NOTE — ED Provider Notes (Signed)
[facility] Provider Note   CSN: [id number] Arrival date & time: 06/03/22  1110     History  Chief Complaint  Patient presents with   Dizziness    BANNER HOLSOPPLE is a 67 y.o. male with a history that includes hypertension, coronary disease, type 2 diabetes, paroxysmal A-fib, presenting to the ED with a constellation of symptoms.  He reports that he has been having occasional "night sweats and chills" for the past month.  This does not occur every night.  Last night he did have a significant episode and was shaking uncontrollably requiring a heating blanket.  He reports he was nauseated and vomited and also had loose bowel movements last night.  He reports has been having sharp intermittent epigastric and abdominal pains.  He said this morning he felt extremely lightheaded.  He said his blood pressure on repeat checks were 80's/40/s at home - typically he has high BP.    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 Dilation caused  bleeding, mucosal tears, disruption of ring, improved passage of the scope  and improved lumen appearance; Pt sneezed twice in the middle of dilation  which caused more tearing than expected but was not full thickness.  post-dilation bleeding was minimal; post-dilation mucosal tears were  superficial   Last cardiac cath 01/07/2021 at [facility]:  Coronary Angiography Findings:  --LM: Minimal luminal irregularities  --LAD: D2 95% ostial lesion but small vessel (<2.34mm), Patent mLAD stent  --LCx: Minimal luminal irregularities  --RI: Large, normal  --RCA: Prox 30%, mid 30%  --LVEDP: 21 mmHg   Comments: Widely patent LAD stent (does not seem to be in the diagonal as  the vessel goes all the way to apex). There is a 95% ostial D2 lesion that  is the culprit but is small vessel size (<2.0 mm). Recommend  medical  therapy.    HPI     Home Medications Prior to Admission medications   Medication Sig Start Date End Date Taking? Authorizing Provider  aspirin EC 81 MG tablet Take 81 mg by mouth daily. Swallow whole.    [provider]  atorvastatin (LIPITOR) 80 MG tablet Take 1 tablet by mouth daily. 01/12/21   [provider]  busPIRone (BUSPAR) 5 MG tablet Take 5 mg by mouth at bedtime. 06/10/21   [provider]  busPIRone (BUSPAR) 7.5 MG tablet Take 1 in the am and 1 at lunchtime as directed 04/06/22   Olive Bass, FNP  clopidogrel (PLAVIX) 75 MG tablet Take 75 mg by mouth daily. 06/23/21   [provider]  dicyclomine (BENTYL) 20 MG tablet Take 1 tablet (20 mg total) by mouth 4 (four) times daily. 05/08/22   Olive Bass, FNP  furosemide (LASIX) 20 MG tablet SMARTSIG:1-2 Tablet(s) By Mouth 06/10/21   [provider]  isosorbide mononitrate (IMDUR) 30 MG 24 hr tablet Take 30 mg by mouth 2 (two) times daily. 03/03/21   [provider]  JARDIANCE 25 MG TABS tablet Take 25 mg by mouth daily. 10/04/21   [provider]  losartan (COZAAR) 50 MG tablet TAKE 1 TABLET BY MOUTH EVERY DAY 12/12/21   Olive Bass, FNP  pantoprazole (PROTONIX) 40 MG tablet TAKE 1 TABLET (40 MG TOTAL) BY MOUTH TWICE A DAY BEFORE MEALS 04/12/22   Olive Bass, FNP  propranolol [facility])  40 MG tablet Take 1 tablet (40 mg total) by mouth 3 (three) times daily. 04/05/22   Olive Bass, FNP      Allergies    Patient has no known allergies.    Review of Systems   Review of Systems  Physical Exam Updated Vital Signs BP 128/75   Pulse 60   Temp 98.2 F (36.8 C) (Oral)   Resp 19   Ht 5\' 10"  (1.778 m)   Wt 86.2 kg   SpO2 98%   BMI 27.26 kg/m  Physical Exam Constitutional:      General: He is not in acute distress. HENT:     Head: Normocephalic and atraumatic.  Eyes:     Conjunctiva/sclera: Conjunctivae normal.      Pupils: Pupils are equal, round, and reactive to light.  Cardiovascular:     Rate and Rhythm: Normal rate and regular rhythm.  Pulmonary:     Effort: Pulmonary effort is normal. No respiratory distress.  Abdominal:     General: There is no distension.     Tenderness: There is abdominal tenderness in the epigastric area. There is no guarding.  Skin:    General: Skin is warm and dry.  Neurological:     General: No focal deficit present.     Mental Status: He is alert. Mental status is at baseline.  Psychiatric:        Mood and Affect: Mood normal.        Behavior: Behavior normal.     ED Results / Procedures / Treatments   Labs (all labs ordered are listed, but only abnormal results are displayed) Labs Reviewed  COMPREHENSIVE METABOLIC PANEL - Abnormal; Notable for the following components:      Result Value   Sodium 134 (*)    Glucose, Bld 134 (*)    BUN 27 (*)    Albumin 3.4 (*)    AST 300 (*)    ALT 279 (*)    Alkaline Phosphatase 577 (*)    Total Bilirubin 5.7 (*)    All other components within normal limits  CBC WITH DIFFERENTIAL/PLATELET - Abnormal; Notable for the following components:   WBC 14.4 (*)    Neutro Abs 13.5 (*)    Lymphs Abs 0.3 (*)    All other components within normal limits  URINALYSIS, ROUTINE W REFLEX MICROSCOPIC - Abnormal; Notable for the following components:   Color, Urine AMBER (*)    Glucose, UA >=500 (*)    Bilirubin Urine LARGE (*)    All other components within normal limits  URINALYSIS, MICROSCOPIC (REFLEX) - Abnormal; Notable for the following components:   Bacteria, UA RARE (*)    All other components within normal limits  SARS CORONAVIRUS 2 BY RT PCR  CULTURE, BLOOD (ROUTINE X 2)  CULTURE, BLOOD (ROUTINE X 2)  LIPASE, BLOOD  LACTIC ACID, PLASMA  TROPONIN I (HIGH SENSITIVITY)    EKG EKG Interpretation  Date/Time:  Saturday Jun 03 2022 11:23:11 EDT Ventricular Rate:  64 PR Interval:  121 QRS Duration: 107 QT  Interval:  422 QTC Calculation: 436 R Axis:   75 Text Interpretation: Sinus rhythm Confirmed by Alvester Chou [phone]) on 06/03/2022 11:39:23 AM  Radiology CT ABDOMEN PELVIS W CONTRAST  Result Date: 06/03/2022 CLINICAL DATA:  Sepsis concern for potential biliary duct obstruction or disease - n/v abdominal pain and fevers EXAM: CT ABDOMEN AND PELVIS WITH CONTRAST TECHNIQUE: Multidetector CT imaging of the abdomen and pelvis was performed using the standard protocol  following bolus administration of intravenous contrast. RADIATION DOSE REDUCTION: This exam was performed according to the departmental dose-optimization program which includes automated exposure control, adjustment of the mA and/or kV according to patient size and/or use of iterative reconstruction technique. CONTRAST:  OMNIPAQUE IOHEXOL 300 MG/ML  SOLN COMPARISON:  March 31, 2022, November 16, 2011 FINDINGS: Lower chest: No acute abnormality. Hepatobiliary: No new focal hepatic lesion. The gallbladder is distended with gallbladder wall thickening. No discrete adjacent fat stranding. However, there is mucosal enhancement throughout the gallbladder lumen as well as the common bile duct (series 5, image 60). Common bile duct is enlarged and measures 13 mm, previously 7 mm in 2022. No discrete choledocholithiasis are visualized. Mild central biliary ductal prominence, new since 2022. Pancreas: Unremarkable. No pancreatic ductal dilatation or surrounding inflammatory changes. Spleen: Revisualization of a peripherally enhancing mass within the lateral spleen, present since 2013 and most consistent with a benign hemangioma. Adrenals/Urinary Tract: Adrenal glands are unremarkable. Kidneys enhance symmetrically. No hydronephrosis. No obstructing nephrolithiasis. Bladder is unremarkable for degree of distension. Stomach/Bowel: No evidence of bowel obstruction. Moderate to large colonic stool burden throughout the colon. Duodenal diverticulum. Appendix is  normal. Vascular/Lymphatic: Atherosclerotic calcifications of the nonaneurysmal abdominal aorta. No new lymphadenopathy. Reproductive: Prostatomegaly. Other: No free air or free fluid. Musculoskeletal: No acute or significant osseous findings. IMPRESSION: 1. There is new mild gallbladder wall thickening and increased mucosal enhancement throughout the gallbladder lumen as well as the common bile duct. Common bile duct is enlarged and measures 13 mm, previously 7 mm in 2022. No discrete choledocholithiasis are visualized. Given reported clinical history of sepsis, findings are concerning for ascending cholangitis of uncertain etiology. 2. Moderate to large colonic stool burden throughout the colon. Aortic Atherosclerosis (ICD10-I70.0). Electronically Signed   By: Meda Klinefelter M.D.   On: 06/03/2022 13:39   DG Chest 2 View  Result Date: 06/03/2022 CLINICAL DATA:  Dizziness.  Nausea vomiting and chills. EXAM: CHEST - 2 VIEW COMPARISON:  CTA chest, 02/19/2020. FINDINGS: Cardiac silhouette normal in size and configuration. Normal mediastinal and hilar contours. Clear lungs.  No pleural effusion or pneumothorax. Skeletal structures are intact. IMPRESSION: No active cardiopulmonary disease. Electronically Signed   By: Amie Portland M.D.   On: 06/03/2022 12:04    Procedures .Critical Care  Performed by: Terald Sleeper, MD Authorized by: Terald Sleeper, MD   Critical care provider statement:    Critical care time (minutes):  45   Critical care time was exclusive of:  Separately billable procedures and treating other patients   Critical care was necessary to treat or prevent imminent or life-threatening deterioration of the following conditions:  Sepsis   Critical care was time spent personally by me on the following activities:  Ordering and performing treatments and interventions, ordering and review of laboratory studies, ordering and review of radiographic studies, pulse oximetry, review of old  charts, examination of patient and evaluation of patient's response to treatment     Medications Ordered in ED Medications  cefTRIAXone (ROCEPHIN) 2 g in sodium chloride 0.9 % 100 mL IVPB (2 g Intravenous New Bag/Given 06/03/22 1428)  metroNIDAZOLE (FLAGYL) IVPB 500 mg (500 mg Intravenous New Bag/Given 06/03/22 1431)  sodium chloride 0.9 % bolus 1,000 mL (0 mLs Intravenous Stopped 06/03/22 1415)  iohexol (OMNIPAQUE) 300 MG/ML solution 100 mL (100 mLs Intravenous Contrast Given 06/03/22 1303)  sodium chloride 0.9 % bolus 1,000 mL (1,000 mLs Intravenous New Bag/Given 06/03/22 1424)    ED Course/ Medical Decision  Making/ A&P Clinical Course as of 06/03/22 1456  Sat Jun 03, 2022  1405 I spoke to Dr Marca Ancona from GI who advises MRI/MRCP imaging upon patient's arrival at the hospital.  If there is no concern for retained stone, they would recommend general surgery consultation for cholecystitis evaluation.  Agreed with antibiotics. [MT]  1415 Admitted to hospitalist Dr Solon Augusta [MT]    Clinical Course User Index [MT] Renaye Rakers Kermit Balo, MD                             Medical Decision Making Amount and/or Complexity of Data Reviewed Labs: ordered. Radiology: ordered.  Risk Prescription drug management. Decision regarding hospitalization.   This patient presents to the ED with concern for intermittent night sweats and chills, intermittent epigastric pains with vomiting and diarrhea. This involves an extensive number of treatment options, and is a complaint that carries with it a high risk of complications and morbidity.  The differential diagnosis includes gastric ulcer versus bacterial infection versus pancreatitis versus acute biliary disease versus biliary colic versus atypical ACS versus other  His reported hypotension at home may be related to dehydration, but I do think an infection workup is warranted at this time with his intermittent night sweats.  Co-morbidities that complicate the patient  evaluation: History of gastritis and small esophageal tear is at high risk of GI complication, GI bleeding  Additional history obtained from the patient's wife at bedside  External records from outside source obtained and reviewed including EGD and most recent cardiac catheterization report  I ordered and personally interpreted labs.  The pertinent results include: Transaminitis, elevated bilirubin level, elevated white blood cell count.  Lactate within normal limits.  I ordered imaging studies including CT scan of the abdomen, x-ray of the chest I independently visualized and interpreted imaging which showed gallbladder wall thickening and edema, common bile duct dilatation without evident retained stone I agree with the radiologist interpretation  The patient was maintained on a cardiac monitor.  I personally viewed and interpreted the cardiac monitored which showed an underlying rhythm of: Sinus rhythm  Per my interpretation the patient's ECG shows no acute ischemic findings  I ordered medication including Rocephin and Flagyl for intra-abdominal infection coverage, IV fluid bolus per hydration/hypotension  I have reviewed the patients home medicines and have made adjustments as needed   After the interventions noted above, I reevaluated the patient and found that they have: improved  Patient blood pressure has been stable throughout his stay in the ED.  He is still mentating well on my reassessment.  With a normal lactate level, no evidence of septic shock.  Patient to continue with fluid hydration, receiving IV antibiotics, will be admitted to the hospital for further workup.  The patient and his wife are in agreement with the plan.         Final Clinical Impression(s) / ED Diagnoses Final diagnoses:  Biliary disease  Generalized abdominal pain    Rx / DC Orders ED Discharge Orders     None         Terald Sleeper, MD 06/03/22 [phone]

## 2022-06-03 NOTE — ED Notes (Signed)
Called [facility] for transport at 2:29.

## 2022-06-03 NOTE — ED Notes (Signed)
2nd set of blood cultures collected from L forearm prior to starting IV abx

## 2022-06-03 NOTE — ED Notes (Signed)
Patient transported to X-ray 

## 2022-06-03 NOTE — ED Notes (Signed)
Attempted calling for report x1.

## 2022-06-04 DIAGNOSIS — K8043 Calculus of bile duct with acute cholecystitis with obstruction: Secondary | ICD-10-CM | POA: Diagnosis not present

## 2022-06-04 DIAGNOSIS — K8051 Calculus of bile duct without cholangitis or cholecystitis with obstruction: Secondary | ICD-10-CM | POA: Insufficient documentation

## 2022-06-04 LAB — CBC
HCT: 40.7 % (ref 39.0–52.0)
Hemoglobin: 13.2 g/dL (ref 13.0–17.0)
MCH: 28 pg (ref 26.0–34.0)
MCHC: 32.4 g/dL (ref 30.0–36.0)
MCV: 86.2 fL (ref 80.0–100.0)
Platelets: 156 10*3/uL (ref 150–400)
RBC: 4.72 MIL/uL (ref 4.22–5.81)
RDW: 14.9 % (ref 11.5–15.5)
WBC: 7.8 10*3/uL (ref 4.0–10.5)
nRBC: 0 % (ref 0.0–0.2)

## 2022-06-04 LAB — COMPREHENSIVE METABOLIC PANEL
ALT: 211 U/L — ABNORMAL HIGH (ref 0–44)
AST: 195 U/L — ABNORMAL HIGH (ref 15–41)
Albumin: 3 g/dL — ABNORMAL LOW (ref 3.5–5.0)
Alkaline Phosphatase: 421 U/L — ABNORMAL HIGH (ref 38–126)
Anion gap: 11 (ref 5–15)
BUN: 25 mg/dL — ABNORMAL HIGH (ref 8–23)
CO2: 21 mmol/L — ABNORMAL LOW (ref 22–32)
Calcium: 8.6 mg/dL — ABNORMAL LOW (ref 8.9–10.3)
Chloride: 105 mmol/L (ref 98–111)
Creatinine, Ser: 0.7 mg/dL (ref 0.61–1.24)
GFR, Estimated: >60 mL/min (ref >60)
Glucose, Bld: 66 mg/dL — ABNORMAL LOW (ref 70–99)
Potassium: 3.3 mmol/L — ABNORMAL LOW (ref 3.5–5.1)
Sodium: 137 mmol/L (ref 135–145)
Total Bilirubin: 5.4 mg/dL — ABNORMAL HIGH (ref 0.3–1.2)
Total Protein: 6.6 g/dL (ref 6.5–8.1)

## 2022-06-04 LAB — GLUCOSE, CAPILLARY
Glucose-Capillary: 107 mg/dL — ABNORMAL HIGH (ref 70–99)
Glucose-Capillary: 150 mg/dL — ABNORMAL HIGH (ref 70–99)
Glucose-Capillary: 77 mg/dL (ref 70–99)
Glucose-Capillary: 84 mg/dL (ref 70–99)
Glucose-Capillary: 93 mg/dL (ref 70–99)

## 2022-06-04 LAB — BLOOD CULTURE ID PANEL (REFLEXED) - BCID2

## 2022-06-04 LAB — PROTIME-INR
INR: 1.2 (ref 0.8–1.2)
Prothrombin Time: 14.7 seconds (ref 11.4–15.2)

## 2022-06-04 LAB — LIPASE, BLOOD: Lipase: 27 U/L (ref 11–51)

## 2022-06-04 LAB — PREALBUMIN: Prealbumin: 14 mg/dL — ABNORMAL LOW (ref 18–38)

## 2022-06-04 LAB — CULTURE, BLOOD (ROUTINE X 2)

## 2022-06-04 MED ORDER — SODIUM CHLORIDE 0.9 % IV SOLN
2.0000 g | INTRAVENOUS | Status: DC
  Administered 2022-06-05 – 2022-06-06 (×3): 2 g via INTRAVENOUS
  Filled 2022-06-04 (×3): qty 20

## 2022-06-04 MED ORDER — DEXTROSE-NACL 5-0.9 % IV SOLN: INTRAVENOUS | Status: DC

## 2022-06-04 MED ORDER — POTASSIUM CHLORIDE 10 MEQ/100ML IV SOLN
10.0000 meq | INTRAVENOUS | Status: AC
  Administered 2022-06-04 (×4): 10 meq via INTRAVENOUS
  Filled 2022-06-04 (×4): qty 100

## 2022-06-04 NOTE — Progress Notes (Addendum)
PROGRESS NOTE  Jeremy Ryan  WUJ:[id number] DOB: 1956-01-01 DOA: 06/03/2022 PCP: Olive Bass, FNP   Brief Narrative: Patient is a 67 y.o. male with medical history significant of hypertension, coronary artery disease, diabetes type 2, paroxysmal A-fib who presented to the emergency department with complaint of dizziness, night sweats, chills .  Patient was having the symptoms for last several months but presented to the emergency department after having severe chills, nausea/vomiting and epigastric abdominal discomfort.Lab work showed WC count of 14.4, alkaline phosphatase 577, AST of 300, ALT of 279.  CT abdomen/pelvis showed gallbladder thickening, findings concerning for ascending cholangitis.  MRCP confirmed choledocholithiasis, cholecystitis.  General surgery, GI following.  Plan for ERCP, lap chole.  Assessment & Plan:  Principal Problem:   Choledocholithiasis with obstruction Active Problems:   CAD in native artery   Essential hypertension   Gastro-esophageal reflux disease without esophagitis   Paroxysmal atrial fibrillation (HCC)   Type 2 diabetes mellitus without complication, without long-term current use of insulin (HCC)   Ascending cholangitis   Hyperbilirubinemia   Chronic nonalcoholic liver disease   History of esophageal stricture   Cholangitis/cholecystitis/choledocholithiasis: Presented with chills, leukocytosis, elevated liver enzymes.  CT abdomen/pelvis showed gallbladder distention, gallbladder wall thickening, dilated CBD.  No clear cholelithiasis seen. Findings were worrisome for ascending cholangitis.  Started on broad-spectrum antibiotics.  Blood cultures have been collected, will follow-up.  MRCP confirmed choledocholithiasis, cholecystitis.  Patient needs ERCP, cholecystectomy.  General surgery, GI following. Patient does not have any anginal symptoms.  I do not believe that he needs further cardiac test if general surgery plans  for cholecystectomy.   He is considered low to moderate risk for this procedure   Coronary artery disease: Denies anginal symptoms.  Follows with [facility]ology at [facility].  Takes aspirin, Plavix. Holding Plavix for possible upcoming procedure.  Continue aspirin for now.   Type 2 diabetes: Takes Jardiance at home.  Currently on sliding scale insulin.  Monitor blood sugars   Hypertension: Takes Imdur, losartan , propranolol at home.  Was hypotensive on presentation.  Antihypertensives on hold.  Monitor blood pressure.   Paroxysmal A-fib: Currently in normal sinus rhythm.  Does not take any anticoagulant.  Only on aspirin and Plavix at home.  Med rec shows he takes propranolol but patient is unsure   History of hyperlipidemia: On Lipitor.  Will  hold due to elevated liver enzymes   History of PUD/GERD: Takes Protonix at home.  Continue PPI            DVT prophylaxis:enoxaparin (LOVENOX) injection 40 mg Start: 06/03/22 2200     Code Status: Full Code  Family Communication: wife at bedside  Patient status:Inpatient  Patient is from :home  Anticipated discharge NF:AOZH  Estimated DC date:2-3 days   Consultants: GI, general surgery  Procedures: None  Antimicrobials:  Anti-infectives (From admission, onward)    Start     Dose/Rate Route Frequency Ordered Stop   06/04/22 1000  cefTRIAXone (ROCEPHIN) 1 g in sodium chloride 0.9 % 100 mL IVPB  Status:  Discontinued        1 g 200 mL/hr over 30 Minutes Intravenous Daily 06/03/22 1458 06/03/22 1717   06/04/22 1000  cefTRIAXone (ROCEPHIN) 2 g in sodium chloride 0.9 % 100 mL IVPB  Status:  Discontinued        2 g 200 mL/hr over 30 Minutes Intravenous Daily 06/03/22 1718 06/03/22 1803   06/03/22 2200  metroNIDAZOLE (FLAGYL) IVPB 500 mg  Status:  Discontinued  500 mg 100 mL/hr over 60 Minutes Intravenous Every 12 hours 06/03/22 1717 06/03/22 1803   06/03/22 2100  metroNIDAZOLE (FLAGYL) IVPB 500 mg  Status:  Discontinued        500 mg 100 mL/hr over  60 Minutes Intravenous Every 8 hours 06/03/22 1458 06/03/22 1716   06/03/22 1830  piperacillin-tazobactam (ZOSYN) IVPB 3.375 g        3.375 g 12.5 mL/hr over 240 Minutes Intravenous Every 8 hours 06/03/22 1803 06/08/22 1759   06/03/22 1400  cefTRIAXone (ROCEPHIN) 2 g in sodium chloride 0.9 % 100 mL IVPB        2 g 200 mL/hr over 30 Minutes Intravenous  Once 06/03/22 1349 06/03/22 1502   06/03/22 1400  metroNIDAZOLE (FLAGYL) IVPB 500 mg        500 mg 100 mL/hr over 60 Minutes Intravenous  Once 06/03/22 1349 06/03/22 1536       Subjective: Patient seen and examined the bedside today.  Hemodynamically stable.  He has remained afebrile.  He is having loose stools.  Complains of lower abdominal pain today.No nausea or vomiting  Objective: Vitals:   06/03/22 1709 06/03/22 2100 06/04/22 0109 06/04/22 0510  BP: (!) 142/73 133/71 (!) 140/61 (!) 118/93  Pulse: 63 68 69 71  Resp: 20 18 18 18   Temp: 98.5 F (36.9 C) 98.1 F (36.7 C) 98.2 F (36.8 C) 97.9 F (36.6 C)  TempSrc: Oral Oral Oral Oral  SpO2: 99% 97%  98%  Weight: 86.8 kg     Height: 5\' 10"  (1.778 m)       Intake/Output Summary (Last 24 hours) at 06/04/2022 1042 Last data filed at 06/03/2022 1536 Gross per 24 hour  Intake 2200.33 ml  Output --  Net 2200.33 ml   Filed Weights   06/03/22 1119 06/03/22 1709  Weight: 86.2 kg 86.8 kg    Examination:  General exam: Overall comfortable, not in distress HEENT: PERRL Respiratory system:  no wheezes or crackles  Cardiovascular system: S1 & S2 heard, RRR.  Gastrointestinal system: Abdomen is nondistended, soft and mostly nontender Central nervous system: Alert and oriented Extremities: No edema, no clubbing ,no cyanosis Skin: No rashes, no ulcers,no icterus     Data Reviewed: I have personally reviewed following labs and imaging studies  CBC: Recent Labs  Lab 06/03/22 1211 06/04/22 0622  WBC 14.4* 7.8  NEUTROABS 13.5*  --   HGB 14.3 13.2  HCT 43.5 40.7  MCV 83.7  86.2  PLT 220 156   Basic Metabolic Panel: Recent Labs  Lab 06/03/22 1211 06/04/22 0622  NA 134* 137  K 3.7 3.3*  CL 101 105  CO2 24 21*  GLUCOSE 134* 66*  BUN 27* 25*  CREATININE 0.92 0.70  CALCIUM 9.0 8.6*     Recent Results (from the past 240 hour(s))  SARS Coronavirus 2 by RT PCR (hospital order, performed in [facility] hospital lab) *cepheid single result test* Anterior Nasal Swab     Status: None   Collection Time: 06/03/22 12:11 PM   Specimen: Anterior Nasal Swab  Result Value Ref Range Status   SARS Coronavirus 2 by RT PCR NEGATIVE NEGATIVE Final    Comment: (NOTE) SARS-CoV-2 target nucleic acids are NOT DETECTED.  The SARS-CoV-2 RNA is generally detectable in upper and lower respiratory specimens during the acute phase of infection. The lowest concentration of SARS-CoV-2 viral copies this assay can detect is 250 copies / mL. A negative result does not preclude SARS-CoV-2 infection and should  not be used as the sole basis for treatment or other patient management decisions.  A negative result may occur with improper specimen collection / handling, submission of specimen other than nasopharyngeal swab, presence of viral mutation(s) within the areas targeted by this assay, and inadequate number of viral copies (<250 copies / mL). A negative result must be combined with clinical observations, patient history, and epidemiological information.  Fact Sheet for Patients:   RoadLapTop.co.za  Fact Sheet for Healthcare Providers: http://kim-miller.com/  This test is not yet approved or  cleared by the [place] FDA and has been authorized for detection and/or diagnosis of SARS-CoV-2 by FDA under an Emergency Use Authorization (EUA).  This EUA will remain in effect (meaning this test can be used) for the duration of the COVID-19 declaration under Section 564(b)(1) of the Act, 21 U.S.C. section 360bbb-3(b)(1), unless the  authorization is terminated or revoked sooner.  Performed at [facility], [address] [place], [place] [address]   Blood culture (routine x 2)     Status: None (Preliminary result)   Collection Time: 06/03/22 12:11 PM   Specimen: BLOOD  Result Value Ref Range Status   Specimen Description   Final    BLOOD LEFT ANTECUBITAL Performed at [facility], [address] [place], [place] [address]    Special Requests   Final    BOTTLES DRAWN AEROBIC AND ANAEROBIC Blood Culture adequate volume Performed at [facility], [address] [place], [place] [address]    Culture   Final    NO GROWTH < 12 HOURS Performed at [facility], [address] [place], [place] [address]    Report Status PENDING  Incomplete  Blood culture (routine x 2)     Status: None (Preliminary result)   Collection Time: 06/03/22  2:12 PM   Specimen: BLOOD  Result Value Ref Range Status   Specimen Description   Final    BLOOD BLOOD LEFT FOREARM Performed at [facility], [address] [place], [place] [address]    Special Requests   Final    BOTTLES DRAWN AEROBIC AND ANAEROBIC Blood Culture adequate volume Performed at [facility], [address] [place], [place] [address]    Culture   Final    NO GROWTH < 12 HOURS Performed at [facility], [address], [place], [place] [address]    Report Status PENDING  Incomplete     Radiology Studies: MR ABDOMEN MRCP W WO CONTAST  Result Date: 06/03/2022 CLINICAL DATA:  Cholangitis EXAM: MRI ABDOMEN WITHOUT AND WITH CONTRAST (INCLUDING MRCP) TECHNIQUE: Multiplanar multisequence MR imaging of the abdomen was performed both before and after the administration of intravenous contrast. Heavily T2-weighted images of the biliary and pancreatic ducts were obtained, and three-dimensional MRCP images were rendered by post processing. CONTRAST:  8mL GADAVIST GADOBUTROL 1 MMOL/ML IV SOLN COMPARISON:   Same day CT of the abdomen and pelvis dated Jun 03, 2022 FINDINGS: Lower chest: No acute findings. Hepatobiliary: Mild heterogeneous enhancement of the right lobe of the liver seen on arterial phase, likely perfusional. No suspicious liver lesions. Dilated gallbladder with mild gallbladder wall thickening. Mild intrahepatic and moderate common bile duct dilation, common bile duct measures up to 13 mm. Stone of the distal common bile duct measuring 8 mm on series 4, image 18. Pancreas: No mass, inflammatory changes, or other parenchymal abnormality identified. Spleen: Enhancing lesion of  the anterior spleen with progressively fills in on delayed imaging measuring 2.7 cm on series 22, image 27, consistent with a benign hemangioma. Adrenals/Urinary Tract: Bilateral adrenal glands are unremarkable. No hydronephrosis. T2 hyperintense renal lesions with no evidence of enhancement, consistent with simple cysts, no specific follow-up imaging is recommended. Stomach/Bowel: Visualized portions within the abdomen are unremarkable. Vascular/Lymphatic: No pathologically enlarged lymph nodes identified. No abdominal aortic aneurysm demonstrated. Other:  None. Musculoskeletal: No suspicious bone lesions identified. IMPRESSION: 1. Common bile duct dilation and choledocholithiasis, 8 mm stone seen in the distal common bile duct. 2. Mild wall enhancement of the common bile duct, concerning for associated ascending cholangitis. 3. Dilated gallbladder with mild gallbladder wall thickening, concerning for cholecystitis. Electronically Signed   By: Allegra Lai M.D.   On: 06/03/2022 20:48   MR 3D Recon At Scanner  Result Date: 06/03/2022 CLINICAL DATA:  Cholangitis EXAM: MRI ABDOMEN WITHOUT AND WITH CONTRAST (INCLUDING MRCP) TECHNIQUE: Multiplanar multisequence MR imaging of the abdomen was performed both before and after the administration of intravenous contrast. Heavily T2-weighted images of the biliary and pancreatic ducts were  obtained, and three-dimensional MRCP images were rendered by post processing. CONTRAST:  8mL GADAVIST GADOBUTROL 1 MMOL/ML IV SOLN COMPARISON:  Same day CT of the abdomen and pelvis dated Jun 03, 2022 FINDINGS: Lower chest: No acute findings. Hepatobiliary: Mild heterogeneous enhancement of the right lobe of the liver seen on arterial phase, likely perfusional. No suspicious liver lesions. Dilated gallbladder with mild gallbladder wall thickening. Mild intrahepatic and moderate common bile duct dilation, common bile duct measures up to 13 mm. Stone of the distal common bile duct measuring 8 mm on series 4, image 18. Pancreas: No mass, inflammatory changes, or other parenchymal abnormality identified. Spleen: Enhancing lesion of the anterior spleen with progressively fills in on delayed imaging measuring 2.7 cm on series 22, image 27, consistent with a benign hemangioma. Adrenals/Urinary Tract: Bilateral adrenal glands are unremarkable. No hydronephrosis. T2 hyperintense renal lesions with no evidence of enhancement, consistent with simple cysts, no specific follow-up imaging is recommended. Stomach/Bowel: Visualized portions within the abdomen are unremarkable. Vascular/Lymphatic: No pathologically enlarged lymph nodes identified. No abdominal aortic aneurysm demonstrated. Other:  None. Musculoskeletal: No suspicious bone lesions identified. IMPRESSION: 1. Common bile duct dilation and choledocholithiasis, 8 mm stone seen in the distal common bile duct. 2. Mild wall enhancement of the common bile duct, concerning for associated ascending cholangitis. 3. Dilated gallbladder with mild gallbladder wall thickening, concerning for cholecystitis. Electronically Signed   By: Allegra Lai M.D.   On: 06/03/2022 20:48   CT ABDOMEN PELVIS W CONTRAST  Result Date: 06/03/2022 CLINICAL DATA:  Sepsis concern for potential biliary duct obstruction or disease - n/v abdominal pain and fevers EXAM: CT ABDOMEN AND PELVIS WITH  CONTRAST TECHNIQUE: Multidetector CT imaging of the abdomen and pelvis was performed using the standard protocol following bolus administration of intravenous contrast. RADIATION DOSE REDUCTION: This exam was performed according to the departmental dose-optimization program which includes automated exposure control, adjustment of the mA and/or kV according to patient size and/or use of iterative reconstruction technique. CONTRAST:  OMNIPAQUE IOHEXOL 300 MG/ML  SOLN COMPARISON:  March 31, 2022, November 16, 2011 FINDINGS: Lower chest: No acute abnormality. Hepatobiliary: No new focal hepatic lesion. The gallbladder is distended with gallbladder wall thickening. No discrete adjacent fat stranding. However, there is mucosal enhancement throughout the gallbladder lumen as well as the common bile duct (series 5, image 60). Common bile duct is enlarged and  measures 13 mm, previously 7 mm in 2022. No discrete choledocholithiasis are visualized. Mild central biliary ductal prominence, new since 2022. Pancreas: Unremarkable. No pancreatic ductal dilatation or surrounding inflammatory changes. Spleen: Revisualization of a peripherally enhancing mass within the lateral spleen, present since 2013 and most consistent with a benign hemangioma. Adrenals/Urinary Tract: Adrenal glands are unremarkable. Kidneys enhance symmetrically. No hydronephrosis. No obstructing nephrolithiasis. Bladder is unremarkable for degree of distension. Stomach/Bowel: No evidence of bowel obstruction. Moderate to large colonic stool burden throughout the colon. Duodenal diverticulum. Appendix is normal. Vascular/Lymphatic: Atherosclerotic calcifications of the nonaneurysmal abdominal aorta. No new lymphadenopathy. Reproductive: Prostatomegaly. Other: No free air or free fluid. Musculoskeletal: No acute or significant osseous findings. IMPRESSION: 1. There is new mild gallbladder wall thickening and increased mucosal enhancement throughout the  gallbladder lumen as well as the common bile duct. Common bile duct is enlarged and measures 13 mm, previously 7 mm in 2022. No discrete choledocholithiasis are visualized. Given reported clinical history of sepsis, findings are concerning for ascending cholangitis of uncertain etiology. 2. Moderate to large colonic stool burden throughout the colon. Aortic Atherosclerosis (ICD10-I70.0). Electronically Signed   By: Meda Klinefelter M.D.   On: 06/03/2022 13:39   DG Chest 2 View  Result Date: 06/03/2022 CLINICAL DATA:  Dizziness.  Nausea vomiting and chills. EXAM: CHEST - 2 VIEW COMPARISON:  CTA chest, 02/19/2020. FINDINGS: Cardiac silhouette normal in size and configuration. Normal mediastinal and hilar contours. Clear lungs.  No pleural effusion or pneumothorax. Skeletal structures are intact. IMPRESSION: No active cardiopulmonary disease. Electronically Signed   By: Amie Portland M.D.   On: 06/03/2022 12:04    Scheduled Meds:  aspirin EC  81 mg Oral Daily   busPIRone  5 mg Oral QHS   busPIRone  7.5 mg Oral BID   enoxaparin (LOVENOX) injection  40 mg Subcutaneous Q24H   insulin aspart  0-9 Units Subcutaneous TID WC   lip balm   Topical BID   pantoprazole  40 mg Oral BID   polycarbophil  625 mg Oral BID   Continuous Infusions:  dextrose 5 % and 0.9% NaCl     lactated ringers     methocarbamol (ROBAXIN) IV     ondansetron (ZOFRAN) IV     piperacillin-tazobactam (ZOSYN)  IV 3.375 g (06/04/22 0215)   potassium chloride       LOS: 1 day   Burnadette Pop, MD [facility] P5/05/2022, 10:42 AM

## 2022-06-04 NOTE — Progress Notes (Signed)
Subjective/Chief Complaint: MRCP shows choledocholithiasis with CBD dilation; enhancement of CBD wall concerning for ascending cholangitis  Have not seen GI's plan yet. No new labs today  Objective: Vital signs in last 24 hours: Temp:  [97.9 F (36.6 C)-98.8 F (37.1 C)] 97.9 F (36.6 C) (05/05 0510) Pulse Rate:  [59-72] 71 (05/05 0510) Resp:  [16-24] 18 (05/05 0510) BP: (105-154)/(55-93) 118/93 (05/05 0510) SpO2:  [95 %-99 %] 98 % (05/05 0510) Weight:  [86.2 kg-86.8 kg] 86.8 kg (05/04 1709) Last BM Date : 06/03/22  Intake/Output from previous day: 05/04 0701 - 05/05 0700 In: 2200.3 [IV Piggyback:2200.3] Out: -  Intake/Output this shift: No intake/output data recorded.   Mild RUQ tenderness  Lab Results:  Recent Labs    06/03/22 1211  WBC 14.4*  HGB 14.3  HCT 43.5  PLT 220   BMET Recent Labs    06/03/22 1211  NA 134*  K 3.7  CL 101  CO2 24  GLUCOSE 134*  BUN 27*  CREATININE 0.92  CALCIUM 9.0   PT/INR No results for input(s): "LABPROT", "INR" in the last 72 hours. ABG No results for input(s): "PHART", "HCO3" in the last 72 hours.  Invalid input(s): "PCO2", "PO2"  Studies/Results: MR ABDOMEN MRCP W WO CONTAST  Result Date: 06/03/2022 CLINICAL DATA:  Cholangitis EXAM: MRI ABDOMEN WITHOUT AND WITH CONTRAST (INCLUDING MRCP) TECHNIQUE: Multiplanar multisequence MR imaging of the abdomen was performed both before and after the administration of intravenous contrast. Heavily T2-weighted images of the biliary and pancreatic ducts were obtained, and three-dimensional MRCP images were rendered by post processing. CONTRAST:  8mL GADAVIST GADOBUTROL 1 MMOL/ML IV SOLN COMPARISON:  Same day CT of the abdomen and pelvis dated Jun 03, 2022 FINDINGS: Lower chest: No acute findings. Hepatobiliary: Mild heterogeneous enhancement of the right lobe of the liver seen on arterial phase, likely perfusional. No suspicious liver lesions. Dilated gallbladder with mild gallbladder  wall thickening. Mild intrahepatic and moderate common bile duct dilation, common bile duct measures up to 13 mm. Stone of the distal common bile duct measuring 8 mm on series 4, image 18. Pancreas: No mass, inflammatory changes, or other parenchymal abnormality identified. Spleen: Enhancing lesion of the anterior spleen with progressively fills in on delayed imaging measuring 2.7 cm on series 22, image 27, consistent with a benign hemangioma. Adrenals/Urinary Tract: Bilateral adrenal glands are unremarkable. No hydronephrosis. T2 hyperintense renal lesions with no evidence of enhancement, consistent with simple cysts, no specific follow-up imaging is recommended. Stomach/Bowel: Visualized portions within the abdomen are unremarkable. Vascular/Lymphatic: No pathologically enlarged lymph nodes identified. No abdominal aortic aneurysm demonstrated. Other:  None. Musculoskeletal: No suspicious bone lesions identified. IMPRESSION: 1. Common bile duct dilation and choledocholithiasis, 8 mm stone seen in the distal common bile duct. 2. Mild wall enhancement of the common bile duct, concerning for associated ascending cholangitis. 3. Dilated gallbladder with mild gallbladder wall thickening, concerning for cholecystitis. Electronically Signed   By: Allegra Lai M.D.   On: 06/03/2022 20:48   MR 3D Recon At Scanner  Result Date: 06/03/2022 CLINICAL DATA:  Cholangitis EXAM: MRI ABDOMEN WITHOUT AND WITH CONTRAST (INCLUDING MRCP) TECHNIQUE: Multiplanar multisequence MR imaging of the abdomen was performed both before and after the administration of intravenous contrast. Heavily T2-weighted images of the biliary and pancreatic ducts were obtained, and three-dimensional MRCP images were rendered by post processing. CONTRAST:  8mL GADAVIST GADOBUTROL 1 MMOL/ML IV SOLN COMPARISON:  Same day CT of the abdomen and pelvis dated Jun 03, 2022 FINDINGS:  Lower chest: No acute findings. Hepatobiliary: Mild heterogeneous enhancement  of the right lobe of the liver seen on arterial phase, likely perfusional. No suspicious liver lesions. Dilated gallbladder with mild gallbladder wall thickening. Mild intrahepatic and moderate common bile duct dilation, common bile duct measures up to 13 mm. Stone of the distal common bile duct measuring 8 mm on series 4, image 18. Pancreas: No mass, inflammatory changes, or other parenchymal abnormality identified. Spleen: Enhancing lesion of the anterior spleen with progressively fills in on delayed imaging measuring 2.7 cm on series 22, image 27, consistent with a benign hemangioma. Adrenals/Urinary Tract: Bilateral adrenal glands are unremarkable. No hydronephrosis. T2 hyperintense renal lesions with no evidence of enhancement, consistent with simple cysts, no specific follow-up imaging is recommended. Stomach/Bowel: Visualized portions within the abdomen are unremarkable. Vascular/Lymphatic: No pathologically enlarged lymph nodes identified. No abdominal aortic aneurysm demonstrated. Other:  None. Musculoskeletal: No suspicious bone lesions identified. IMPRESSION: 1. Common bile duct dilation and choledocholithiasis, 8 mm stone seen in the distal common bile duct. 2. Mild wall enhancement of the common bile duct, concerning for associated ascending cholangitis. 3. Dilated gallbladder with mild gallbladder wall thickening, concerning for cholecystitis. Electronically Signed   By: Allegra Lai M.D.   On: 06/03/2022 20:48   CT ABDOMEN PELVIS W CONTRAST  Result Date: 06/03/2022 CLINICAL DATA:  Sepsis concern for potential biliary duct obstruction or disease - n/v abdominal pain and fevers EXAM: CT ABDOMEN AND PELVIS WITH CONTRAST TECHNIQUE: Multidetector CT imaging of the abdomen and pelvis was performed using the standard protocol following bolus administration of intravenous contrast. RADIATION DOSE REDUCTION: This exam was performed according to the departmental dose-optimization program which includes  automated exposure control, adjustment of the mA and/or kV according to patient size and/or use of iterative reconstruction technique. CONTRAST:  OMNIPAQUE IOHEXOL 300 MG/ML  SOLN COMPARISON:  March 31, 2022, November 16, 2011 FINDINGS: Lower chest: No acute abnormality. Hepatobiliary: No new focal hepatic lesion. The gallbladder is distended with gallbladder wall thickening. No discrete adjacent fat stranding. However, there is mucosal enhancement throughout the gallbladder lumen as well as the common bile duct (series 5, image 60). Common bile duct is enlarged and measures 13 mm, previously 7 mm in 2022. No discrete choledocholithiasis are visualized. Mild central biliary ductal prominence, new since 2022. Pancreas: Unremarkable. No pancreatic ductal dilatation or surrounding inflammatory changes. Spleen: Revisualization of a peripherally enhancing mass within the lateral spleen, present since 2013 and most consistent with a benign hemangioma. Adrenals/Urinary Tract: Adrenal glands are unremarkable. Kidneys enhance symmetrically. No hydronephrosis. No obstructing nephrolithiasis. Bladder is unremarkable for degree of distension. Stomach/Bowel: No evidence of bowel obstruction. Moderate to large colonic stool burden throughout the colon. Duodenal diverticulum. Appendix is normal. Vascular/Lymphatic: Atherosclerotic calcifications of the nonaneurysmal abdominal aorta. No new lymphadenopathy. Reproductive: Prostatomegaly. Other: No free air or free fluid. Musculoskeletal: No acute or significant osseous findings. IMPRESSION: 1. There is new mild gallbladder wall thickening and increased mucosal enhancement throughout the gallbladder lumen as well as the common bile duct. Common bile duct is enlarged and measures 13 mm, previously 7 mm in 2022. No discrete choledocholithiasis are visualized. Given reported clinical history of sepsis, findings are concerning for ascending cholangitis of uncertain etiology. 2.  Moderate to large colonic stool burden throughout the colon. Aortic Atherosclerosis (ICD10-I70.0). Electronically Signed   By: Meda Klinefelter M.D.   On: 06/03/2022 13:39   DG Chest 2 View  Result Date: 06/03/2022 CLINICAL DATA:  Dizziness.  Nausea vomiting and  chills. EXAM: CHEST - 2 VIEW COMPARISON:  CTA chest, 02/19/2020. FINDINGS: Cardiac silhouette normal in size and configuration. Normal mediastinal and hilar contours. Clear lungs.  No pleural effusion or pneumothorax. Skeletal structures are intact. IMPRESSION: No active cardiopulmonary disease. Electronically Signed   By: Amie Portland M.D.   On: 06/03/2022 12:04    Anti-infectives: Anti-infectives (From admission, onward)    Start     Dose/Rate Route Frequency Ordered Stop   06/04/22 1000  cefTRIAXone (ROCEPHIN) 1 g in sodium chloride 0.9 % 100 mL IVPB  Status:  Discontinued        1 g 200 mL/hr over 30 Minutes Intravenous Daily 06/03/22 1458 06/03/22 1717   06/04/22 1000  cefTRIAXone (ROCEPHIN) 2 g in sodium chloride 0.9 % 100 mL IVPB  Status:  Discontinued        2 g 200 mL/hr over 30 Minutes Intravenous Daily 06/03/22 1718 06/03/22 1803   06/03/22 2200  metroNIDAZOLE (FLAGYL) IVPB 500 mg  Status:  Discontinued        500 mg 100 mL/hr over 60 Minutes Intravenous Every 12 hours 06/03/22 1717 06/03/22 1803   06/03/22 2100  metroNIDAZOLE (FLAGYL) IVPB 500 mg  Status:  Discontinued        500 mg 100 mL/hr over 60 Minutes Intravenous Every 8 hours 06/03/22 1458 06/03/22 1716   06/03/22 1830  piperacillin-tazobactam (ZOSYN) IVPB 3.375 g        3.375 g 12.5 mL/hr over 240 Minutes Intravenous Every 8 hours 06/03/22 1803 06/08/22 1759   06/03/22 1400  cefTRIAXone (ROCEPHIN) 2 g in sodium chloride 0.9 % 100 mL IVPB        2 g 200 mL/hr over 30 Minutes Intravenous  Once 06/03/22 1349 06/03/22 1502   06/03/22 1400  metroNIDAZOLE (FLAGYL) IVPB 500 mg        500 mg 100 mL/hr over 60 Minutes Intravenous  Once 06/03/22 1349 06/03/22 1536        Assessment/Plan: Choledocholithiasis/ ascending cholangitis - recommend urgent ERCP to decompress the biliary tree Acute cholecystitis - continue IV antibiotics; will need laparoscopic cholecystectomy this admission after biliary tree is cleared or stented Hold long-acting anticoagulation to facilitate interventions  LOS: 1 day    Wynona Luna 06/04/2022

## 2022-06-04 NOTE — Consult Note (Signed)
[facility] Gastroenterology Consult  Referring Provider: ER, [facility] hospitalist/Dr. Renford Dills Primary Care Physician:  Olive Bass, FNP Primary Gastroenterologist: Gentry Fitz  Reason for Consultation: Concerns for ascending cholangitis  HPI: Jeremy Ryan is a 67 y.o. male with in his usual state of health until the beginning of March when he developed intermittent episodes of nausea, vomiting and abdominal pain.  Friday evening, patient felt unusually sick, complained of having severe chills, required an electric blanket in addition to 5 blankets and decided to come to the ER.  His wife had taken his blood pressure at home which was 86/46 mmHg. In the ER he was found to have WBC of 14.4, elevated LFTs T. bili/AST/ALT/ALP of 5.7/300/279/577 and had CT showing gallbladder wall thickening, mucosal enhancement of gallbladder lumen as well as bile duct with CBD 13 mm. He was subsequently admitted and underwent an MRCP which showed common bile duct dilation and choledocholithiasis 8 mm with mild enhancement of common bile duct concerning for associated ascending cholangitis with dilated gallbladder and mild gallbladder wall thickening concerning for cholecystitis.  Patient reports he has IBS, denies noticing blood in stool or black stools. He denies acid reflux but takes omeprazole at home. He has had difficulty swallowing solids as well as liquids for many years for which she had an EGD with balloon dilation. He reports losing 55 pounds intentionally for control of diabetes since August of last year. He has coronary artery disease and a stent was placed in 2013, he takes Plavix, last dose yesterday morning, he is also on baby aspirin.  No family history of colon cancer but family members with leukemia, spinal tumor, lung cancer.  Prior GI workup: Colonoscopy, outside facility, 01/2021, patient reports removal of 5 colonic polyps, to 10 mm, to 13 mm and 130 mm polyp was removed. Repeat  colonoscopy, outside facility 6/23, patient reports 3 additional polyps removed. EGD last year for dysphagia with balloon dilation, patient reports postprocedure who was admitted for 2 days with concern for deep esophageal laceration, no esophageal perforation.   Past Medical History:  Diagnosis Date   Colon polyps    Coronary artery disease    History of blood clots    Hypertension     Past Surgical History:  Procedure Laterality Date   CORONARY STENT INTERVENTION      Prior to Admission medications   Medication Sig Start Date End Date Taking? Authorizing Provider  aspirin EC 81 MG tablet Take 81 mg by mouth daily. Swallow whole.   Yes [provider]  atorvastatin (LIPITOR) 80 MG tablet Take 1 tablet by mouth every evening. 01/12/21  Yes [provider]  BLACK CURRANT SEED OIL PO Take 1 capsule by mouth daily.   Yes [provider]  busPIRone (BUSPAR) 5 MG tablet Take 5 mg by mouth at bedtime. 06/10/21  Yes [provider]  busPIRone (BUSPAR) 7.5 MG tablet Take 1 in the am and 1 at lunchtime as directed Patient taking differently: Take 7.5 mg by mouth See admin instructions. Take 1 in the am and 1 at lunchtime as directed 04/06/22  Yes [place] Scrape, Allyne Gee, FNP  Cinnamon 500 MG capsule Take 500 mg by mouth daily.   Yes [provider]  clopidogrel (PLAVIX) 75 MG tablet Take 75 mg by mouth daily. 06/23/21  Yes [provider]  dicyclomine (BENTYL) 20 MG tablet Take 1 tablet (20 mg total) by mouth 4 (four) times daily. 05/08/22  Yes Olive Bass, FNP  isosorbide mononitrate (IMDUR)  30 MG 24 hr tablet Take 30 mg by mouth 2 (two) times daily. 03/03/21  Yes [provider]  JARDIANCE 25 MG TABS tablet Take 25 mg by mouth daily. 10/04/21  Yes [provider]  losartan (COZAAR) 25 MG tablet Take 25 mg by mouth daily.   Yes [provider]  Multiple Vitamins-Minerals (MULTIVITAMIN WITH MINERALS) tablet Take 1  tablet by mouth daily.   Yes [provider]  pantoprazole (PROTONIX) 40 MG tablet TAKE 1 TABLET (40 MG TOTAL) BY MOUTH TWICE A DAY BEFORE MEALS Patient taking differently: Take 40 mg by mouth 2 (two) times daily. 04/12/22  Yes Olive Bass, FNP  propranolol (INDERAL) 40 MG tablet Take 1 tablet (40 mg total) by mouth 3 (three) times daily. 04/05/22  Yes Olive Bass, FNP  losartan (COZAAR) 50 MG tablet TAKE 1 TABLET BY MOUTH EVERY DAY Patient not taking: Reported on 06/03/2022 12/12/21   Olive Bass, FNP    Current Facility-Administered Medications  Medication Dose Route Frequency Provider Last Rate Last Admin   alum & mag hydroxide-simeth (MAALOX/MYLANTA) 200-200-20 MG/5ML suspension 30 mL  30 mL Oral Q6H PRN Karie Soda, MD       aspirin EC tablet 81 mg  81 mg Oral Daily Adhikari, Willia Craze, MD       bisacodyl (DULCOLAX) suppository 10 mg  10 mg Rectal Q12H PRN Karie Soda, MD       busPIRone (BUSPAR) tablet 5 mg  5 mg Oral QHS Adhikari, Amrit, MD       busPIRone (BUSPAR) tablet 7.5 mg  7.5 mg Oral BID Adhikari, Amrit, MD       dextrose 5 %-0.9 % sodium chloride infusion   Intravenous Continuous Adhikari, Amrit, MD       enoxaparin (LOVENOX) injection 40 mg  40 mg Subcutaneous Q24H Burnadette Pop, MD   40 mg at 06/03/22 2344   HYDROmorphone (DILAUDID) injection 0.5-2 mg  0.5-2 mg Intravenous Q2H PRN Karie Soda, MD   1 mg at 06/04/22 0845   insulin aspart (novoLOG) injection 0-9 Units  0-9 Units Subcutaneous TID WC Adhikari, Amrit, MD       lactated ringers bolus 1,000 mL  1,000 mL Intravenous Q8H PRN Karie Soda, MD       lip balm (CARMEX) ointment   Topical BID Karie Soda, MD   75 Application at 06/03/22 2121   magic mouthwash  15 mL Oral QID PRN Karie Soda, MD       menthol-cetylpyridinium (CEPACOL) lozenge 3 mg  1 lozenge Oral PRN Karie Soda, MD       methocarbamol (ROBAXIN) 1,000 mg in dextrose 5 % 100 mL IVPB  1,000 mg Intravenous Q6H  PRN Karie Soda, MD       metoCLOPramide (REGLAN) injection 5-10 mg  5-10 mg Intravenous Q8H PRN Karie Soda, MD       ondansetron (ZOFRAN) injection 4 mg  4 mg Intravenous Q6H PRN Karie Soda, MD       Or   ondansetron (ZOFRAN) 8 mg in sodium chloride 0.9 % 50 mL IVPB  8 mg Intravenous Q6H PRN Karie Soda, MD       oxyCODONE (Oxy IR/ROXICODONE) immediate release tablet 5 mg  5 mg Oral Q4H PRN Adhikari, Amrit, MD       pantoprazole (PROTONIX) EC tablet 40 mg  40 mg Oral BID Adhikari, Amrit, MD       phenol (CHLORASEPTIC) mouth spray 2 spray  2 spray Mouth/Throat PRN Karie Soda,  MD       piperacillin-tazobactam (ZOSYN) IVPB 3.375 g  3.375 g Intravenous Trixie Deis, MD 12.5 mL/hr at 06/04/22 0215 3.375 g at 06/04/22 0215   polycarbophil (FIBERCON) tablet 625 mg  625 mg Oral BID Karie Soda, MD       polyethylene glycol (MIRALAX / GLYCOLAX) packet 17 g  17 g Oral Q12H PRN Karie Soda, MD       potassium chloride 10 mEq in 100 mL IVPB  10 mEq Intravenous Q1 Hr x 4 Adhikari, Amrit, MD       prochlorperazine (COMPAZINE) injection 5-10 mg  5-10 mg Intravenous Q4H PRN Karie Soda, MD       simethicone (MYLICON) 40 MG/0.6ML suspension 80 mg  80 mg Oral QID PRN Karie Soda, MD        Allergies as of 06/03/2022   (No Known Allergies)    History reviewed. No pertinent family history.  Social History   Socioeconomic History   Marital status: Married    Spouse name: Not on file   Number of children: Not on file   Years of education: Not on file   Highest education level: Not on file  Occupational History   Not on file  Tobacco Use   Smoking status: Former   Smokeless tobacco: Never  Vaping Use   Vaping Use: Never used  Substance and Sexual Activity   Alcohol use: Never   Drug use: Never   Sexual activity: Not on file  Other Topics Concern   Not on file  Social History Narrative   Not on file   Social Determinants of Health   Financial Resource Strain: Low  Risk  (11/04/2021)   Overall Financial Resource Strain (CARDIA)    Difficulty of Paying Living Expenses: Not hard at all  Food Insecurity: No Food Insecurity (06/03/2022)   Hunger Vital Sign    Worried About Running Out of Food in the Last Year: Never true    Ran Out of Food in the Last Year: Never true  Transportation Needs: No Transportation Needs (06/03/2022)   PRAPARE - Administrator, Civil Service (Medical): No    Lack of Transportation (Non-Medical): No  Physical Activity: Inactive (11/04/2021)   Exercise Vital Sign    Days of Exercise per Week: 0 days    Minutes of Exercise per Session: 0 min  Stress: No Stress Concern Present (11/04/2021)   [employer] of Occupational Health - Occupational Stress Questionnaire    Feeling of Stress : Not at all  Social Connections: Moderately Integrated (11/04/2021)   Social Connection and Isolation Panel [NHANES]    Frequency of Communication with Friends and Family: More than three times a week    Frequency of Social Gatherings with Friends and Family: Twice a week    Attends Religious Services: More than 4 times per year    Active Member of [employer] or Organizations: No    Attends Banker Meetings: Never    Marital Status: Married  Catering manager Violence: Not At Risk (06/03/2022)   Humiliation, Afraid, Rape, and Kick questionnaire    Fear of Current or Ex-Partner: No    Emotionally Abused: No    Physically Abused: No    Sexually Abused: No    Review of Systems: As per HPI  Physical Exam: Vital signs in last 24 hours: Temp:  [97.9 F (36.6 C)-98.8 F (37.1 C)] 97.9 F (36.6 C) (05/05 0510) Pulse Rate:  [59-72] 71 (05/05 0510) Resp:  [  16-24] 18 (05/05 0510) BP: (105-154)/(55-93) 118/93 (05/05 0510) SpO2:  [95 %-99 %] 98 % (05/05 0510) Weight:  [86.2 kg-86.8 kg] 86.8 kg (05/04 1709) Last BM Date : 06/03/22  General:   Alert,  Well-developed, well-nourished, pleasant and cooperative in NAD Head:   Normocephalic and atraumatic. Eyes: Icteric Ears:  Normal auditory acuity. Nose:  No deformity, discharge,  or lesions. Mouth:  No deformity or lesions.  Oropharynx pink & moist. Neck:  Supple; no masses or thyromegaly. Lungs:  Clear throughout to auscultation.   No wheezes, crackles, or rhonchi. No acute distress. Heart:  Regular rate and rhythm; no murmurs, clicks, rubs,  or gallops. Extremities:  Without clubbing or edema. Neurologic:  Alert and  oriented x4;  grossly normal neurologically. Skin:  Intact without significant lesions or rashes. Psych:  Alert and cooperative. Normal mood and affect. Abdomen:  Soft, epigastric and suprapubic tenderness and nondistended. No masses, hepatosplenomegaly or hernias noted. Normal bowel sounds, without guarding, and without rebound.         Lab Results: Recent Labs    06/03/22 1211 06/04/22 0622  WBC 14.4* 7.8  HGB 14.3 13.2  HCT 43.5 40.7  PLT 220 156   BMET Recent Labs    06/03/22 1211 06/04/22 0622  NA 134* 137  K 3.7 3.3*  CL 101 105  CO2 24 21*  GLUCOSE 134* 66*  BUN 27* 25*  CREATININE 0.92 0.70  CALCIUM 9.0 8.6*   LFT Recent Labs    06/04/22 0622  PROT 6.6  ALBUMIN 3.0*  AST 195*  ALT 211*  ALKPHOS 421*  BILITOT 5.4*   PT/INR Recent Labs    06/04/22 0622  LABPROT 14.7  INR 1.2    Studies/Results: MR ABDOMEN MRCP W WO CONTAST  Result Date: 06/03/2022 CLINICAL DATA:  Cholangitis EXAM: MRI ABDOMEN WITHOUT AND WITH CONTRAST (INCLUDING MRCP) TECHNIQUE: Multiplanar multisequence MR imaging of the abdomen was performed both before and after the administration of intravenous contrast. Heavily T2-weighted images of the biliary and pancreatic ducts were obtained, and three-dimensional MRCP images were rendered by post processing. CONTRAST:  8mL GADAVIST GADOBUTROL 1 MMOL/ML IV SOLN COMPARISON:  Same day CT of the abdomen and pelvis dated Jun 03, 2022 FINDINGS: Lower chest: No acute findings. Hepatobiliary: Mild  heterogeneous enhancement of the right lobe of the liver seen on arterial phase, likely perfusional. No suspicious liver lesions. Dilated gallbladder with mild gallbladder wall thickening. Mild intrahepatic and moderate common bile duct dilation, common bile duct measures up to 13 mm. Stone of the distal common bile duct measuring 8 mm on series 4, image 18. Pancreas: No mass, inflammatory changes, or other parenchymal abnormality identified. Spleen: Enhancing lesion of the anterior spleen with progressively fills in on delayed imaging measuring 2.7 cm on series 22, image 27, consistent with a benign hemangioma. Adrenals/Urinary Tract: Bilateral adrenal glands are unremarkable. No hydronephrosis. T2 hyperintense renal lesions with no evidence of enhancement, consistent with simple cysts, no specific follow-up imaging is recommended. Stomach/Bowel: Visualized portions within the abdomen are unremarkable. Vascular/Lymphatic: No pathologically enlarged lymph nodes identified. No abdominal aortic aneurysm demonstrated. Other:  None. Musculoskeletal: No suspicious bone lesions identified. IMPRESSION: 1. Common bile duct dilation and choledocholithiasis, 8 mm stone seen in the distal common bile duct. 2. Mild wall enhancement of the common bile duct, concerning for associated ascending cholangitis. 3. Dilated gallbladder with mild gallbladder wall thickening, concerning for cholecystitis. Electronically Signed   By: Allegra Lai M.D.   On: 06/03/2022 20:48  MR 3D Recon At Scanner  Result Date: 06/03/2022 CLINICAL DATA:  Cholangitis EXAM: MRI ABDOMEN WITHOUT AND WITH CONTRAST (INCLUDING MRCP) TECHNIQUE: Multiplanar multisequence MR imaging of the abdomen was performed both before and after the administration of intravenous contrast. Heavily T2-weighted images of the biliary and pancreatic ducts were obtained, and three-dimensional MRCP images were rendered by post processing. CONTRAST:  8mL GADAVIST GADOBUTROL 1  MMOL/ML IV SOLN COMPARISON:  Same day CT of the abdomen and pelvis dated Jun 03, 2022 FINDINGS: Lower chest: No acute findings. Hepatobiliary: Mild heterogeneous enhancement of the right lobe of the liver seen on arterial phase, likely perfusional. No suspicious liver lesions. Dilated gallbladder with mild gallbladder wall thickening. Mild intrahepatic and moderate common bile duct dilation, common bile duct measures up to 13 mm. Stone of the distal common bile duct measuring 8 mm on series 4, image 18. Pancreas: No mass, inflammatory changes, or other parenchymal abnormality identified. Spleen: Enhancing lesion of the anterior spleen with progressively fills in on delayed imaging measuring 2.7 cm on series 22, image 27, consistent with a benign hemangioma. Adrenals/Urinary Tract: Bilateral adrenal glands are unremarkable. No hydronephrosis. T2 hyperintense renal lesions with no evidence of enhancement, consistent with simple cysts, no specific follow-up imaging is recommended. Stomach/Bowel: Visualized portions within the abdomen are unremarkable. Vascular/Lymphatic: No pathologically enlarged lymph nodes identified. No abdominal aortic aneurysm demonstrated. Other:  None. Musculoskeletal: No suspicious bone lesions identified. IMPRESSION: 1. Common bile duct dilation and choledocholithiasis, 8 mm stone seen in the distal common bile duct. 2. Mild wall enhancement of the common bile duct, concerning for associated ascending cholangitis. 3. Dilated gallbladder with mild gallbladder wall thickening, concerning for cholecystitis. Electronically Signed   By: Allegra Lai M.D.   On: 06/03/2022 20:48   CT ABDOMEN PELVIS W CONTRAST  Result Date: 06/03/2022 CLINICAL DATA:  Sepsis concern for potential biliary duct obstruction or disease - n/v abdominal pain and fevers EXAM: CT ABDOMEN AND PELVIS WITH CONTRAST TECHNIQUE: Multidetector CT imaging of the abdomen and pelvis was performed using the standard protocol  following bolus administration of intravenous contrast. RADIATION DOSE REDUCTION: This exam was performed according to the departmental dose-optimization program which includes automated exposure control, adjustment of the mA and/or kV according to patient size and/or use of iterative reconstruction technique. CONTRAST:  OMNIPAQUE IOHEXOL 300 MG/ML  SOLN COMPARISON:  March 31, 2022, November 16, 2011 FINDINGS: Lower chest: No acute abnormality. Hepatobiliary: No new focal hepatic lesion. The gallbladder is distended with gallbladder wall thickening. No discrete adjacent fat stranding. However, there is mucosal enhancement throughout the gallbladder lumen as well as the common bile duct (series 5, image 60). Common bile duct is enlarged and measures 13 mm, previously 7 mm in 2022. No discrete choledocholithiasis are visualized. Mild central biliary ductal prominence, new since 2022. Pancreas: Unremarkable. No pancreatic ductal dilatation or surrounding inflammatory changes. Spleen: Revisualization of a peripherally enhancing mass within the lateral spleen, present since 2013 and most consistent with a benign hemangioma. Adrenals/Urinary Tract: Adrenal glands are unremarkable. Kidneys enhance symmetrically. No hydronephrosis. No obstructing nephrolithiasis. Bladder is unremarkable for degree of distension. Stomach/Bowel: No evidence of bowel obstruction. Moderate to large colonic stool burden throughout the colon. Duodenal diverticulum. Appendix is normal. Vascular/Lymphatic: Atherosclerotic calcifications of the nonaneurysmal abdominal aorta. No new lymphadenopathy. Reproductive: Prostatomegaly. Other: No free air or free fluid. Musculoskeletal: No acute or significant osseous findings. IMPRESSION: 1. There is new mild gallbladder wall thickening and increased mucosal enhancement throughout the gallbladder lumen as well  as the common bile duct. Common bile duct is enlarged and measures 13 mm, previously 7 mm in  2022. No discrete choledocholithiasis are visualized. Given reported clinical history of sepsis, findings are concerning for ascending cholangitis of uncertain etiology. 2. Moderate to large colonic stool burden throughout the colon. Aortic Atherosclerosis (ICD10-I70.0). Electronically Signed   By: Meda Klinefelter M.D.   On: 06/03/2022 13:39   DG Chest 2 View  Result Date: 06/03/2022 CLINICAL DATA:  Dizziness.  Nausea vomiting and chills. EXAM: CHEST - 2 VIEW COMPARISON:  CTA chest, 02/19/2020. FINDINGS: Cardiac silhouette normal in size and configuration. Normal mediastinal and hilar contours. Clear lungs.  No pleural effusion or pneumothorax. Skeletal structures are intact. IMPRESSION: No active cardiopulmonary disease. Electronically Signed   By: Amie Portland M.D.   On: 06/03/2022 12:04    Impression: CBD stone, gallbladder wall thickening, abnormal LFTs, concern for ascending cholangitis  Plavix last dose 06/03/2022  Plan: Discussed about ERCP, sphincterotomy and balloon sweep in details with the patient as well as family member, wife and daughter present at bedside. Patient is afebrile, has improvement in leukocytosis, remains hemodynamically stable. Since Plavix was last taken yesterday morning, will wait for at least 24 hours for ERCP and biliary decompression. Ideally, Plavix needs to be on hold for 5 days, this was discussed with the patient, his hospitalist and surgery team. I will start the patient on clear liquid diet and keep him n.p.o. postmidnight. His last dose of Lovenox will be 10 PM tonight. He was given IV ceftriaxone and IV Flagyl yesterday and is now on IV Zosyn. He is continued on lactated Ringer's.   LOS: 1 day   Kerin Salen, MD  06/04/2022, 9:41 AM

## 2022-06-05 ENCOUNTER — Encounter (HOSPITAL_COMMUNITY): Payer: Self-pay | Admitting: Internal Medicine

## 2022-06-05 ENCOUNTER — Encounter (HOSPITAL_COMMUNITY)
Admission: EM | Disposition: A | Discharge status: Home / Self Care | Payer: Self-pay | Source: Home / Self Care | Attending: Internal Medicine

## 2022-06-05 ENCOUNTER — Inpatient Hospital Stay (HOSPITAL_COMMUNITY): Payer: Medicare (Managed Care)

## 2022-06-05 ENCOUNTER — Telehealth: Payer: Self-pay | Admitting: *Deleted

## 2022-06-05 ENCOUNTER — Inpatient Hospital Stay (HOSPITAL_COMMUNITY): Payer: Medicare (Managed Care) | Admitting: Anesthesiology

## 2022-06-05 DIAGNOSIS — K8050 Calculus of bile duct without cholangitis or cholecystitis without obstruction: Secondary | ICD-10-CM | POA: Diagnosis not present

## 2022-06-05 DIAGNOSIS — I25119 Atherosclerotic heart disease of native coronary artery with unspecified angina pectoris: Secondary | ICD-10-CM | POA: Diagnosis not present

## 2022-06-05 DIAGNOSIS — K8043 Calculus of bile duct with acute cholecystitis with obstruction: Secondary | ICD-10-CM | POA: Diagnosis not present

## 2022-06-05 DIAGNOSIS — K2950 Unspecified chronic gastritis without bleeding: Secondary | ICD-10-CM | POA: Diagnosis not present

## 2022-06-05 DIAGNOSIS — E119 Type 2 diabetes mellitus without complications: Secondary | ICD-10-CM

## 2022-06-05 DIAGNOSIS — I10 Essential (primary) hypertension: Secondary | ICD-10-CM

## 2022-06-05 DIAGNOSIS — K449 Diaphragmatic hernia without obstruction or gangrene: Secondary | ICD-10-CM | POA: Diagnosis not present

## 2022-06-05 DIAGNOSIS — Z87891 Personal history of nicotine dependence: Secondary | ICD-10-CM

## 2022-06-05 HISTORY — PX: REMOVAL OF STONES: SHX5545

## 2022-06-05 HISTORY — PX: SPHINCTEROTOMY: SHX5544

## 2022-06-05 HISTORY — PX: ENDOSCOPIC RETROGRADE CHOLANGIOPANCREATOGRAPHY (ERCP) WITH PROPOFOL: SHX5810

## 2022-06-05 LAB — CBC
HCT: 47.6 % (ref 39.0–52.0)
Hemoglobin: 15.5 g/dL (ref 13.0–17.0)
MCH: 27.6 pg (ref 26.0–34.0)
MCHC: 32.6 g/dL (ref 30.0–36.0)
MCV: 84.7 fL (ref 80.0–100.0)
Platelets: 193 10*3/uL (ref 150–400)
RBC: 5.62 MIL/uL (ref 4.22–5.81)
RDW: 14.6 % (ref 11.5–15.5)
WBC: 5.5 10*3/uL (ref 4.0–10.5)
nRBC: 0 % (ref 0.0–0.2)

## 2022-06-05 LAB — COMPREHENSIVE METABOLIC PANEL
ALT: 184 U/L — ABNORMAL HIGH (ref 0–44)
AST: 125 U/L — ABNORMAL HIGH (ref 15–41)
Albumin: 3.4 g/dL — ABNORMAL LOW (ref 3.5–5.0)
Alkaline Phosphatase: 472 U/L — ABNORMAL HIGH (ref 38–126)
Anion gap: 8 (ref 5–15)
BUN: 11 mg/dL (ref 8–23)
CO2: 25 mmol/L (ref 22–32)
Calcium: 9.5 mg/dL (ref 8.9–10.3)
Chloride: 102 mmol/L (ref 98–111)
Creatinine, Ser: 0.74 mg/dL (ref 0.61–1.24)
GFR, Estimated: >60 mL/min (ref >60)
Glucose, Bld: 126 mg/dL — ABNORMAL HIGH (ref 70–99)
Potassium: 3.5 mmol/L (ref 3.5–5.1)
Sodium: 135 mmol/L (ref 135–145)
Total Bilirubin: 2.3 mg/dL — ABNORMAL HIGH (ref 0.3–1.2)
Total Protein: 7.9 g/dL (ref 6.5–8.1)

## 2022-06-05 LAB — CULTURE, BLOOD (ROUTINE X 2)
Special Requests: ADEQUATE
Special Requests: ADEQUATE

## 2022-06-05 LAB — GLUCOSE, CAPILLARY
Glucose-Capillary: 106 mg/dL — ABNORMAL HIGH (ref 70–99)
Glucose-Capillary: 127 mg/dL — ABNORMAL HIGH (ref 70–99)
Glucose-Capillary: 128 mg/dL — ABNORMAL HIGH (ref 70–99)
Glucose-Capillary: 130 mg/dL — ABNORMAL HIGH (ref 70–99)
Glucose-Capillary: 132 mg/dL — ABNORMAL HIGH (ref 70–99)
Glucose-Capillary: 140 mg/dL — ABNORMAL HIGH (ref 70–99)
Glucose-Capillary: 165 mg/dL — ABNORMAL HIGH (ref 70–99)

## 2022-06-05 SURGERY — ENDOSCOPIC RETROGRADE CHOLANGIOPANCREATOGRAPHY (ERCP) WITH PROPOFOL
Anesthesia: General

## 2022-06-05 MED ORDER — FENTANYL CITRATE (PF) 100 MCG/2ML IJ SOLN
INTRAMUSCULAR | Status: DC | PRN
  Administered 2022-06-05: 50 ug via INTRAVENOUS

## 2022-06-05 MED ORDER — DICLOFENAC SUPPOSITORY 100 MG
RECTAL | Status: AC
  Filled 2022-06-05: qty 1

## 2022-06-05 MED ORDER — LACTATED RINGERS IV SOLN: INTRAVENOUS | Status: DC

## 2022-06-05 MED ORDER — MIDAZOLAM HCL 2 MG/2ML IJ SOLN
INTRAMUSCULAR | Status: AC
  Filled 2022-06-05: qty 2

## 2022-06-05 MED ORDER — GLUCAGON HCL RDNA (DIAGNOSTIC) 1 MG IJ SOLR
INTRAMUSCULAR | Status: AC
  Filled 2022-06-05: qty 1

## 2022-06-05 MED ORDER — ROCURONIUM BROMIDE 10 MG/ML (PF) SYRINGE
PREFILLED_SYRINGE | INTRAVENOUS | Status: DC | PRN
  Administered 2022-06-05: 50 mg via INTRAVENOUS

## 2022-06-05 MED ORDER — MIDAZOLAM HCL 5 MG/5ML IJ SOLN
INTRAMUSCULAR | Status: DC | PRN
  Administered 2022-06-05: 2 mg via INTRAVENOUS

## 2022-06-05 MED ORDER — PROPOFOL 10 MG/ML IV BOLUS
INTRAVENOUS | Status: DC | PRN
  Administered 2022-06-05: 110 mg via INTRAVENOUS

## 2022-06-05 MED ORDER — DICLOFENAC SUPPOSITORY 100 MG
RECTAL | Status: DC | PRN
  Administered 2022-06-05: 100 mg via RECTAL

## 2022-06-05 MED ORDER — SODIUM CHLORIDE 0.9 % IV SOLN: INTRAVENOUS | Status: DC

## 2022-06-05 MED ORDER — PHENYLEPHRINE 80 MCG/ML (10ML) SYRINGE FOR IV PUSH (FOR BLOOD PRESSURE SUPPORT)
PREFILLED_SYRINGE | INTRAVENOUS | Status: DC | PRN
  Administered 2022-06-05: 80 ug via INTRAVENOUS
  Administered 2022-06-05: 160 ug via INTRAVENOUS

## 2022-06-05 MED ORDER — FENTANYL CITRATE (PF) 100 MCG/2ML IJ SOLN
INTRAMUSCULAR | Status: AC
  Filled 2022-06-05: qty 2

## 2022-06-05 MED ORDER — ONDANSETRON HCL 4 MG/2ML IJ SOLN
INTRAMUSCULAR | Status: DC | PRN
  Administered 2022-06-05: 4 mg via INTRAVENOUS

## 2022-06-05 MED ORDER — SUGAMMADEX SODIUM 200 MG/2ML IV SOLN
INTRAVENOUS | Status: DC | PRN
  Administered 2022-06-05: 200 mg via INTRAVENOUS

## 2022-06-05 MED ORDER — SODIUM CHLORIDE 0.9 % IV SOLN
INTRAVENOUS | Status: DC | PRN
  Administered 2022-06-05: 25 mL

## 2022-06-05 MED ORDER — LIDOCAINE 2% (20 MG/ML) 5 ML SYRINGE
INTRAMUSCULAR | Status: DC | PRN
  Administered 2022-06-05: 60 mg via INTRAVENOUS

## 2022-06-05 MED ORDER — PROPOFOL 10 MG/ML IV BOLUS
INTRAVENOUS | Status: AC
  Filled 2022-06-05: qty 20

## 2022-06-05 MED ORDER — DEXAMETHASONE SODIUM PHOSPHATE 10 MG/ML IJ SOLN
INTRAMUSCULAR | Status: DC | PRN
  Administered 2022-06-05: 4 mg via INTRAVENOUS

## 2022-06-05 NOTE — Progress Notes (Addendum)
Jeremy Ryan 10:50 AM  Subjective: Patient seen and examined and discussed with Dr. Marca Ancona his hospital computer chart reviewed and we discussed his endoscopy a few months ago in [place] we rediscussed this procedure and thoroughly discussed the risks and success rate and his endoscopy and barium swallow on care everywhere were reviewed as well  Objective: Vital signs stable afebrile no acute distress exam please see preassessment evaluation MRCP reviewed labs reviewed  Assessment: CBD stones  Plan: Risk benefits methods and success rate of ERCP was discussed and we will start with an endoscopy just to be sure  [facility]  office [phone] After 5PM or if no answer call [phone]

## 2022-06-05 NOTE — Progress Notes (Addendum)
Day of Surgery   Subjective/Chief Complaint: C/o some upper abdominal discomfort as well some cramping lower abd pain. Reports abdominal pain and intermittent nausea/vomiting since Easter. Also has a history of IBS/diverticulitis. Reports history of heart disease and stent placement.  Going for ERCP this AM.   Objective: Vital signs in last 24 hours: Temp:  [97.9 F (36.6 C)-98.3 F (36.8 C)] 97.9 F (36.6 C) (05/06 0455) Pulse Rate:  [57-61] 59 (05/06 0455) Resp:  [18-20] 20 (05/06 0455) BP: (154-159)/(75-91) 159/91 (05/06 0455) SpO2:  [96 %-99 %] 97 % (05/06 0455) Last BM Date : 06/05/22  Intake/Output from previous day: 05/05 0701 - 05/06 0700 In: 965.7 [P.O.:240; I.V.:299.3; IV Piggyback:426.4] Out: 1775 [Urine:1775] Intake/Output this shift: No intake/output data recorded.   Mild epigastric tenderness, mild SP tenderness, overall nondistended.   Lab Results:  Recent Labs    06/04/22 0622 06/05/22 0637  WBC 7.8 5.5  HGB 13.2 15.5  HCT 40.7 47.6  PLT 156 193   BMET Recent Labs    06/04/22 0622 06/05/22 0637  NA 137 135  K 3.3* 3.5  CL 105 102  CO2 21* 25  GLUCOSE 66* 126*  BUN 25* 11  CREATININE 0.70 0.74  CALCIUM 8.6* 9.5   PT/INR Recent Labs    06/04/22 0622  LABPROT 14.7  INR 1.2   ABG No results for input(s): "PHART", "HCO3" in the last 72 hours.  Invalid input(s): "PCO2", "PO2"  Studies/Results: MR ABDOMEN MRCP W WO CONTAST  Result Date: 06/03/2022 CLINICAL DATA:  Cholangitis EXAM: MRI ABDOMEN WITHOUT AND WITH CONTRAST (INCLUDING MRCP) TECHNIQUE: Multiplanar multisequence MR imaging of the abdomen was performed both before and after the administration of intravenous contrast. Heavily T2-weighted images of the biliary and pancreatic ducts were obtained, and three-dimensional MRCP images were rendered by post processing. CONTRAST:  8mL GADAVIST GADOBUTROL 1 MMOL/ML IV SOLN COMPARISON:  Same day CT of the abdomen and pelvis dated Jun 03, 2022  FINDINGS: Lower chest: No acute findings. Hepatobiliary: Mild heterogeneous enhancement of the right lobe of the liver seen on arterial phase, likely perfusional. No suspicious liver lesions. Dilated gallbladder with mild gallbladder wall thickening. Mild intrahepatic and moderate common bile duct dilation, common bile duct measures up to 13 mm. Stone of the distal common bile duct measuring 8 mm on series 4, image 18. Pancreas: No mass, inflammatory changes, or other parenchymal abnormality identified. Spleen: Enhancing lesion of the anterior spleen with progressively fills in on delayed imaging measuring 2.7 cm on series 22, image 27, consistent with a benign hemangioma. Adrenals/Urinary Tract: Bilateral adrenal glands are unremarkable. No hydronephrosis. T2 hyperintense renal lesions with no evidence of enhancement, consistent with simple cysts, no specific follow-up imaging is recommended. Stomach/Bowel: Visualized portions within the abdomen are unremarkable. Vascular/Lymphatic: No pathologically enlarged lymph nodes identified. No abdominal aortic aneurysm demonstrated. Other:  None. Musculoskeletal: No suspicious bone lesions identified. IMPRESSION: 1. Common bile duct dilation and choledocholithiasis, 8 mm stone seen in the distal common bile duct. 2. Mild wall enhancement of the common bile duct, concerning for associated ascending cholangitis. 3. Dilated gallbladder with mild gallbladder wall thickening, concerning for cholecystitis. Electronically Signed   By: Allegra Lai M.D.   On: 06/03/2022 20:48   MR 3D Recon At Scanner  Result Date: 06/03/2022 CLINICAL DATA:  Cholangitis EXAM: MRI ABDOMEN WITHOUT AND WITH CONTRAST (INCLUDING MRCP) TECHNIQUE: Multiplanar multisequence MR imaging of the abdomen was performed both before and after the administration of intravenous contrast. Heavily T2-weighted images of the  biliary and pancreatic ducts were obtained, and three-dimensional MRCP images were  rendered by post processing. CONTRAST:  8mL GADAVIST GADOBUTROL 1 MMOL/ML IV SOLN COMPARISON:  Same day CT of the abdomen and pelvis dated Jun 03, 2022 FINDINGS: Lower chest: No acute findings. Hepatobiliary: Mild heterogeneous enhancement of the right lobe of the liver seen on arterial phase, likely perfusional. No suspicious liver lesions. Dilated gallbladder with mild gallbladder wall thickening. Mild intrahepatic and moderate common bile duct dilation, common bile duct measures up to 13 mm. Stone of the distal common bile duct measuring 8 mm on series 4, image 18. Pancreas: No mass, inflammatory changes, or other parenchymal abnormality identified. Spleen: Enhancing lesion of the anterior spleen with progressively fills in on delayed imaging measuring 2.7 cm on series 22, image 27, consistent with a benign hemangioma. Adrenals/Urinary Tract: Bilateral adrenal glands are unremarkable. No hydronephrosis. T2 hyperintense renal lesions with no evidence of enhancement, consistent with simple cysts, no specific follow-up imaging is recommended. Stomach/Bowel: Visualized portions within the abdomen are unremarkable. Vascular/Lymphatic: No pathologically enlarged lymph nodes identified. No abdominal aortic aneurysm demonstrated. Other:  None. Musculoskeletal: No suspicious bone lesions identified. IMPRESSION: 1. Common bile duct dilation and choledocholithiasis, 8 mm stone seen in the distal common bile duct. 2. Mild wall enhancement of the common bile duct, concerning for associated ascending cholangitis. 3. Dilated gallbladder with mild gallbladder wall thickening, concerning for cholecystitis. Electronically Signed   By: Allegra Lai M.D.   On: 06/03/2022 20:48   CT ABDOMEN PELVIS W CONTRAST  Result Date: 06/03/2022 CLINICAL DATA:  Sepsis concern for potential biliary duct obstruction or disease - n/v abdominal pain and fevers EXAM: CT ABDOMEN AND PELVIS WITH CONTRAST TECHNIQUE: Multidetector CT imaging of the  abdomen and pelvis was performed using the standard protocol following bolus administration of intravenous contrast. RADIATION DOSE REDUCTION: This exam was performed according to the departmental dose-optimization program which includes automated exposure control, adjustment of the mA and/or kV according to patient size and/or use of iterative reconstruction technique. CONTRAST:  OMNIPAQUE IOHEXOL 300 MG/ML  SOLN COMPARISON:  March 31, 2022, November 16, 2011 FINDINGS: Lower chest: No acute abnormality. Hepatobiliary: No new focal hepatic lesion. The gallbladder is distended with gallbladder wall thickening. No discrete adjacent fat stranding. However, there is mucosal enhancement throughout the gallbladder lumen as well as the common bile duct (series 5, image 60). Common bile duct is enlarged and measures 13 mm, previously 7 mm in 2022. No discrete choledocholithiasis are visualized. Mild central biliary ductal prominence, new since 2022. Pancreas: Unremarkable. No pancreatic ductal dilatation or surrounding inflammatory changes. Spleen: Revisualization of a peripherally enhancing mass within the lateral spleen, present since 2013 and most consistent with a benign hemangioma. Adrenals/Urinary Tract: Adrenal glands are unremarkable. Kidneys enhance symmetrically. No hydronephrosis. No obstructing nephrolithiasis. Bladder is unremarkable for degree of distension. Stomach/Bowel: No evidence of bowel obstruction. Moderate to large colonic stool burden throughout the colon. Duodenal diverticulum. Appendix is normal. Vascular/Lymphatic: Atherosclerotic calcifications of the nonaneurysmal abdominal aorta. No new lymphadenopathy. Reproductive: Prostatomegaly. Other: No free air or free fluid. Musculoskeletal: No acute or significant osseous findings. IMPRESSION: 1. There is new mild gallbladder wall thickening and increased mucosal enhancement throughout the gallbladder lumen as well as the common bile duct. Common  bile duct is enlarged and measures 13 mm, previously 7 mm in 2022. No discrete choledocholithiasis are visualized. Given reported clinical history of sepsis, findings are concerning for ascending cholangitis of uncertain etiology. 2. Moderate to large colonic stool  burden throughout the colon. Aortic Atherosclerosis (ICD10-I70.0). Electronically Signed   By: Meda Klinefelter M.D.   On: 06/03/2022 13:39   DG Chest 2 View  Result Date: 06/03/2022 CLINICAL DATA:  Dizziness.  Nausea vomiting and chills. EXAM: CHEST - 2 VIEW COMPARISON:  CTA chest, 02/19/2020. FINDINGS: Cardiac silhouette normal in size and configuration. Normal mediastinal and hilar contours. Clear lungs.  No pleural effusion or pneumothorax. Skeletal structures are intact. IMPRESSION: No active cardiopulmonary disease. Electronically Signed   By: Amie Portland M.D.   On: 06/03/2022 12:04    Anti-infectives: Anti-infectives (From admission, onward)    Start     Dose/Rate Route Frequency Ordered Stop   06/05/22 0000  [MAR Hold]  cefTRIAXone (ROCEPHIN) 2 g in sodium chloride 0.9 % 100 mL IVPB        (MAR Hold since Mon 06/05/2022 at 1006.Hold Reason: Transfer to a Procedural area)   2 g 200 mL/hr over 30 Minutes Intravenous Every 24 hours 06/04/22 2237     06/04/22 1000  cefTRIAXone (ROCEPHIN) 1 g in sodium chloride 0.9 % 100 mL IVPB  Status:  Discontinued        1 g 200 mL/hr over 30 Minutes Intravenous Daily 06/03/22 1458 06/03/22 1717   06/04/22 1000  cefTRIAXone (ROCEPHIN) 2 g in sodium chloride 0.9 % 100 mL IVPB  Status:  Discontinued        2 g 200 mL/hr over 30 Minutes Intravenous Daily 06/03/22 1718 06/03/22 1803   06/03/22 2200  metroNIDAZOLE (FLAGYL) IVPB 500 mg  Status:  Discontinued        500 mg 100 mL/hr over 60 Minutes Intravenous Every 12 hours 06/03/22 1717 06/03/22 1803   06/03/22 2100  metroNIDAZOLE (FLAGYL) IVPB 500 mg  Status:  Discontinued        500 mg 100 mL/hr over 60 Minutes Intravenous Every 8 hours  06/03/22 1458 06/03/22 1716   06/03/22 1830  piperacillin-tazobactam (ZOSYN) IVPB 3.375 g  Status:  Discontinued        3.375 g 12.5 mL/hr over 240 Minutes Intravenous Every 8 hours 06/03/22 1803 06/04/22 2237   06/03/22 1400  cefTRIAXone (ROCEPHIN) 2 g in sodium chloride 0.9 % 100 mL IVPB        2 g 200 mL/hr over 30 Minutes Intravenous  Once 06/03/22 1349 06/03/22 1502   06/03/22 1400  metroNIDAZOLE (FLAGYL) IVPB 500 mg        500 mg 100 mL/hr over 60 Minutes Intravenous  Once 06/03/22 1349 06/03/22 1536       Assessment/Plan: Choledocholithiasis/ascending cholangitis - bili 2.3 from 5.4, going for ERCP today by Dr. Ewing Schlein. Acute cholecystitis - continue IV antibiotics; will need laparoscopic cholecystectomy this admission after biliary tree is cleared or stented, possibly tomorrow. Hold long-acting anticoagulation to facilitate interventions   LOS: 2 days    Adam Phenix PA-C 06/05/2022

## 2022-06-05 NOTE — Progress Notes (Signed)
  Transition of Care [facility]) Screening Note   Patient Details  Name: Jeremy Ryan Date of Birth: October 17, 1955   Transition of Care [facility]) CM/SW Contact:    Erin Sons, LCSW Phone Number: 06/05/2022, 3:43 PM    Transition of Care Department [facility]) has reviewed patient and no TOC needs have been identified at this time. We will continue to monitor patient advancement through interdisciplinary progression rounds. If new patient transition needs arise, please place a TOC consult.

## 2022-06-05 NOTE — Telephone Encounter (Signed)
Pt went to ED and is currently admitted.  

## 2022-06-05 NOTE — Transfer of Care (Signed)
Immediate Anesthesia Transfer of Care Note  Patient: Jeremy Ryan  Procedure(s) Performed: ENDOSCOPIC RETROGRADE CHOLANGIOPANCREATOGRAPHY (ERCP) WITH PROPOFOL REMOVAL OF STONES SPHINCTEROTOMY  Patient Location: PACU  Anesthesia Type:General  Level of Consciousness: sedated, patient cooperative, and responds to stimulation  Airway & Oxygen Therapy: Patient Spontanous Breathing and Patient connected to face mask oxygen  Post-op Assessment: Report given to RN and Post -op Vital signs reviewed and stable  Post vital signs: stable  Last Vitals:  Vitals Value Taken Time  BP    Temp    Pulse 69 06/05/22 1203  Resp 22 06/05/22 1203  SpO2 100 % 06/05/22 1203  Vitals shown include unvalidated device data.  Last Pain:  Vitals:   06/05/22 1012  TempSrc: Tympanic  PainSc: 6          Complications: No notable events documented.

## 2022-06-05 NOTE — Anesthesia Preprocedure Evaluation (Addendum)
Anesthesia Evaluation  Patient identified by MRN, date of birth, ID band Patient awake    Reviewed: Allergy & Precautions, NPO status , Patient's Chart, lab work & pertinent test results, reviewed documented beta blocker date and time   Airway Mallampati: II  TM Distance: >3 FB Neck ROM: Full    Dental no notable dental hx. (+) Teeth Intact, Dental Advisory Given   Pulmonary former smoker   Pulmonary exam normal breath sounds clear to auscultation       Cardiovascular hypertension, Pt. on medications and Pt. on home beta blockers + angina  + CAD and + Cardiac Stents  Normal cardiovascular exam+ dysrhythmias Atrial Fibrillation  Rhythm:Regular Rate:Normal     Neuro/Psych negative neurological ROS  negative psych ROS   GI/Hepatic Neg liver ROS,GERD  ,,  Endo/Other  diabetes, Type 2    Renal/GU negative Renal ROS  negative genitourinary   Musculoskeletal negative musculoskeletal ROS (+)    Abdominal   Peds  Hematology  (+) Blood dyscrasia (plavix)   Anesthesia Other Findings   Reproductive/Obstetrics                             Anesthesia Physical Anesthesia Plan  ASA: 3  Anesthesia Plan: General   Post-op Pain Management: Minimal or no pain anticipated   Induction: Intravenous  PONV Risk Score and Plan: Midazolam, Dexamethasone and Ondansetron  Airway Management Planned: Oral ETT  Additional Equipment:   Intra-op Plan:   Post-operative Plan: Extubation in OR  Informed Consent: I have reviewed the patients History and Physical, chart, labs and discussed the procedure including the risks, benefits and alternatives for the proposed anesthesia with the patient or authorized representative who has indicated his/her understanding and acceptance.     Dental advisory given  Plan Discussed with: CRNA  Anesthesia Plan Comments:        Anesthesia Quick Evaluation

## 2022-06-05 NOTE — Anesthesia Procedure Notes (Signed)
Procedure Name: Intubation Date/Time: 06/05/2022 11:04 AM  Performed by: Kizzie Fantasia, CRNAPre-anesthesia Checklist: Patient identified, Emergency Drugs available, Suction available, Patient being monitored and Timeout performed Patient Re-evaluated:Patient Re-evaluated prior to induction Oxygen Delivery Method: Circle system utilized Preoxygenation: Pre-oxygenation with 100% oxygen Induction Type: IV induction Ventilation: Mask ventilation without difficulty Laryngoscope Size: Mac and 4 Grade View: Grade I Tube type: Oral Tube size: 7.5 mm Number of attempts: 1 Airway Equipment and Method: Stylet Placement Confirmation: ETT inserted through vocal cords under direct vision, positive ETCO2 and breath sounds checked- equal and bilateral Secured at: 23 cm Tube secured with: Tape Dental Injury: Teeth and Oropharynx as per pre-operative assessment

## 2022-06-05 NOTE — Op Note (Signed)
[facility] Patient Name: Jeremy Ryan Procedure Date: 06/05/2022 MRN: [id number] Attending MD: Vida Rigger , MD, [id number] Date of Birth: 1955-11-27 CSN: [id number] Age: 67 Admit Type: Inpatient Procedure:                ERCP Indications:              Bile duct stone(s) Providers:                Vida Rigger, MD, Stephens Shire RN, RN, Rozetta Nunnery, Technician Referring MD:              Medicines:                General Anesthesia Complications:            No immediate complications. Estimated Blood Loss:     Estimated blood loss was minimal which occurred                            with removal withdrawing the balloon from the                            sphincterotomy site without signs of bleeding at                            the end of the procedure. Procedure:                Pre-Anesthesia Assessment:                           - Prior to the procedure, a History and Physical                            was performed, and patient medications and                            allergies were reviewed. The patient's tolerance of                            previous anesthesia was also reviewed. The risks                            and benefits of the procedure and the sedation                            options and risks were discussed with the patient.                            All questions were answered, and informed consent                            was obtained. Prior Anticoagulants: The patient has                            taken  Plavix (clopidogrel), last dose was 3 days                            prior to procedure. ASA Grade Assessment: III - A                            patient with severe systemic disease. After                            reviewing the risks and benefits, the patient was                            deemed in satisfactory condition to undergo the                            procedure.                           After  obtaining informed consent, the scope was                            passed under direct vision. Throughout the                            procedure, the patient's blood pressure, pulse, and                            oxygen saturations were monitored continuously. The                            [id number]-Q190V ([id number] Olympus duodenoscope was                            introduced through the mouth, and used to inject                            contrast into and used to cannulate the bile duct.                            The [id number]-[id number] ([id number]) Olympus endoscope was                            introduced through the and used to inject contrast                            into. The ERCP was accomplished without difficulty.                            The patient tolerated the procedure well. Scope In: Scope Out: Findings:      A standard esophagogastroduodenoscopy scope was used for the examination       of the upper gastrointestinal tract. The scope was passed under direct       vision through the upper GI tract. A small hiatal hernia was present.  Abnormal motility was noted at the gastroesophageal junction. The distal       esophagus/lower esophageal sphincter is spastic, but gives up passage to       the endoscope. Patchy mild inflammation characterized by erythema and       granularity was found in the entire stomach and antrum. The duodenal       bulb, first portion of the duodenum and second portion of the duodenum       were normal. We did this for his history of dysphagia and his history of       EGD problems from December and we switched to the side-viewing scope and       the the major papilla was normal. The major papilla was some distance       away from a diverticulum. Selective cannulation was readily obtained and       there was no pancreatic duct wire advancement or injection throughout       the procedure and we thought we had seen a small stone on initial        cholangiogram and we proceeded with a biliary sphincterotomy was made       with a Hydratome sphincterotome using ERBE electrocautery. There was no       post-sphincterotomy bleeding. We proceeded until we had adequate biliary       drainage and could get the fully bowed sphincterotome easily in and out       of the duct the biliary tree was swept with an adjustable 12 to 15 mm       balloon using the 15 mm balloon which passed fairly readily through the       patent sphincterotomy site and some sludge was removed but no frank       stone and we switched to the adjustable 15 to 18 mm balloon starting at       the bifurcation. The 18 was passed again without much resistance and       sludge was swept from the duct but no obvious stone was seen and on       occlusion cholangiogram was no obvious residual filling defect and there       was sluggish but present drainage and we elected to stop the procedure       at this point and the wire and the balloon were removed and the scope       was removed and the patient tolerated the procedure well. Impression:               - Small hiatal hernia.                           - Abnormal esophageal motility, suspicious for                            achalasia.                           - Chronic gastritis.                           - Normal duodenal bulb, first portion of the  duodenum and second portion of the duodenum.                           - The major papilla appeared normal.                           - The major papilla was some distance away from a                            diverticulum.                           - A biliary sphincterotomy was performed.                           - The biliary tree was swept and sludge was found.                            But nothing was seen on subsequent balloon                            pull-through's as above Moderate Sedation:      Not Applicable - Patient had care per  Anesthesia. Recommendation:           - Clear liquid diet today. N.p.o. after midnight                            for probable surgery tomorrow and would recommend                            an IntraOp cholangiogram just to be sure since no                            obvious stone was seen                           - Continue present medications.                           - Return to GI clinic PRN.                           - Telephone GI clinic if symptomatic PRN.                           - Check liver enzymes (AST, ALT, alkaline                            phosphatase, bilirubin) in the morning. Follow back                            to normal as an outpatient Procedure Code(s):        --- Professional ---  16109, Endoscopic retrograde                            cholangiopancreatography (ERCP); with removal of                            calculi/debris from biliary/pancreatic duct(s)                           43262, Endoscopic retrograde                            cholangiopancreatography (ERCP); with                            sphincterotomy/papillotomy Diagnosis Code(s):        --- Professional ---                           K44.9, Diaphragmatic hernia without obstruction or                            gangrene                           K22.4, Dyskinesia of esophagus                           K29.50, Unspecified chronic gastritis without                            bleeding                           K80.50, Calculus of bile duct without cholangitis                            or cholecystitis without obstruction CPT copyright 2022 [employer]. All rights reserved. The codes documented in this report are preliminary and upon coder review may  be revised to meet current compliance requirements. Vida Rigger, MD 06/05/2022 12:28:41 PM This report has been signed electronically. Number of Addenda: 0

## 2022-06-05 NOTE — Telephone Encounter (Signed)
Who Is Calling Patient / Member / Family / Caregiver Call Type Triage / Clinical Caller Name Jeremy Ryan Relationship To Patient Spouse Return Phone Number [phone] (Primary) Chief Complaint BLOOD PRESSURE LOW - Systolic (top number) 90 or less Reason for Call Symptomatic / Request for Health Information Initial Comment Caller states her husband was sick all night, intermittently shaking uncontrollably, really cold and cant get warm, he is a diabetic diagnosed last year who has lost an extreme about of weight, his BP is low 84/43 and he is dizzy. Translation No Nurse Assessment Nurse: Lexine Baton, RN, Belenda Cruise Date/Time (Eastern Time): 06/03/2022 10:26:03 AM Confirm and document reason for call. If symptomatic, describe symptoms. ---Caller states that her husband has dizziness and low blood pressure. Does the patient have any new or worsening symptoms? ---Yes Will a triage be completed? ---Yes Related visit to physician within the last 2 weeks? ---No Does the PT have any chronic conditions? (i.e. diabetes, asthma, this includes High risk factors for pregnancy, etc.) ---Yes List chronic conditions. ---HTN, diabetic Is this a behavioral health or substance abuse call? ---No Guidelines Guideline Title Affirmed Question Affirmed Notes Nurse Date/Time (Eastern Time) Blood Pressure - Low [1] Systolic BP < 90 AND [2] dizzy, lightheaded, or weak Donadeo, RN, Kristin 06/03/2022 10:26:48 AM Disp. Time Lamount Cohen Time) Disposition Final User 06/03/2022 10:24:39 AM Send to Urgent Marylen Ponto, Geoffery Spruce PLEASE NOTE: All timestamps contained within this report are represented as [place] Standard Time. CONFIDENTIALTY NOTICE: This fax transmission is intended only for the addressee. It contains information that is legally privileged, confidential or otherwise protected from use or disclosure. If you are not the intended recipient, you are strictly prohibited from reviewing, disclosing, copying  using or disseminating any of this information or taking any action in reliance on or regarding this information. If you have received this fax in error, please notify us immediately by telephone so that we can arrange for its return to [place]. Phone: [phone], Toll-Free: [phone], Fax: [phone] Page: 2 of 2 Call Id: [id number] Disp. Time Lamount Cohen Time) Disposition Final User 06/03/2022 10:27:27 AM Call EMS 911 Now Yes Lexine Baton, RN, Belenda Cruise 06/03/2022 10:29:36 AM 911 Outcome Documentation Donadeo, RN, Belenda Cruise Reason: Caller states she will drive him directly to the hospital now, they can not afford an ambulance. Final Disposition 06/03/2022 10:27:27 AM Call EMS 911 Now Yes Lexine Baton, RN, Belenda Cruise

## 2022-06-05 NOTE — Anesthesia Postprocedure Evaluation (Signed)
Anesthesia Post Note  Patient: Jeremy Ryan  Procedure(s) Performed: ENDOSCOPIC RETROGRADE CHOLANGIOPANCREATOGRAPHY (ERCP) WITH PROPOFOL REMOVAL OF STONES SPHINCTEROTOMY     Patient location during evaluation: Endoscopy Anesthesia Type: General Level of consciousness: awake and alert Pain management: pain level controlled Vital Signs Assessment: post-procedure vital signs reviewed and stable Respiratory status: spontaneous breathing, nonlabored ventilation, respiratory function stable and patient connected to nasal cannula oxygen Cardiovascular status: blood pressure returned to baseline and stable Postop Assessment: no apparent nausea or vomiting Anesthetic complications: no  No notable events documented.  Last Vitals:  Vitals:   06/05/22 1230 06/05/22 1250  BP: (!) 173/65 (!) 156/70  Pulse: 63 (!) 55  Resp: 18 18  Temp:  36.5 C  SpO2: 96% 95%    Last Pain:  Vitals:   06/05/22 1250  TempSrc: Oral  PainSc:                  [name] [name]

## 2022-06-05 NOTE — Progress Notes (Signed)
PROGRESS NOTE  Jeremy Ryan  QMV:[id number] DOB: 10-16-1955 DOA: 06/03/2022 PCP: Olive Bass, FNP   Brief Narrative: Patient is a 67 y.o. male with medical history significant of hypertension, coronary artery disease, diabetes type 2, paroxysmal A-fib who presented to the emergency department with complaint of dizziness, night sweats, chills .  Patient was having the symptoms for last several months but presented to the emergency department after having severe chills, nausea/vomiting and epigastric abdominal discomfort.Lab work showed WC count of 14.4, alkaline phosphatase 577, AST of 300, ALT of 279.  CT abdomen/pelvis showed gallbladder thickening, findings concerning for ascending cholangitis.  MRCP confirmed choledocholithiasis, cholecystitis.  General surgery, GI following.  Plan for ERCP, lap chole.  Hospital course remarkable for E. coli bacteremia  Assessment & Plan:  Principal Problem:   Choledocholithiasis with obstruction Active Problems:   CAD in native artery   Essential hypertension   Gastro-esophageal reflux disease without esophagitis   Paroxysmal atrial fibrillation (HCC)   Type 2 diabetes mellitus without complication, without long-term current use of insulin (HCC)   Ascending cholangitis   Hyperbilirubinemia   Chronic nonalcoholic liver disease   History of esophageal stricture   Cholangitis/cholecystitis/choledocholithiasis: Presented with chills, leukocytosis, elevated liver enzymes.  CT abdomen/pelvis showed gallbladder distention, gallbladder wall thickening, dilated CBD.  No clear cholelithiasis seen. Findings were worrisome for ascending cholangitis.  Started on broad-spectrum antibiotics.  Blood cultures have been collected, will follow-up.  MRCP confirmed choledocholithiasis, cholecystitis.  Patient needs ERCP, cholecystectomy.  General surgery, GI following. Patient does not have any anginal symptoms.  I do not believe that he needs further cardiac  test if general surgery plans  for cholecystectomy.  He is considered low to moderate risk for this procedure  Sepsis/E. coli bacteremia: Secondary to ascending cholangitis.  Already on ceftriaxone.  Will change antibiotics to oral on discharge after getting sensitivities   Coronary artery disease: Denies anginal symptoms.  Follows with [facility]ology at [facility].  Takes aspirin, Plavix. Holding Plavix for possible upcoming procedure.  Continue aspirin for now.   Type 2 diabetes: Takes Jardiance at home.  Currently on sliding scale insulin.  Monitor blood sugars   Hypertension: Takes Imdur, losartan , propranolol at home.  Was hypotensive on presentation.  Antihypertensives on hold.  Monitor blood pressure.   Paroxysmal A-fib: Currently in normal sinus rhythm.  Does not take any anticoagulant.  Only on aspirin and Plavix at home.  Med rec shows he takes propranolol but patient is unsure   History of hyperlipidemia: On Lipitor.  Will  hold due to elevated liver enzymes   History of PUD/GERD: Takes Protonix at home.  Continue PPI            DVT prophylaxis:enoxaparin (LOVENOX) injection 40 mg Start: 06/03/22 2200     Code Status: Full Code  Family Communication: wife /daughter at bedside  Patient status:Inpatient  Patient is from :home  Anticipated discharge MW:UXLK  Estimated DC date:1-2 days   Consultants: GI, general surgery  Procedures: None  Antimicrobials:  Anti-infectives (From admission, onward)    Start     Dose/Rate Route Frequency Ordered Stop   06/05/22 0000  [MAR Hold]  cefTRIAXone (ROCEPHIN) 2 g in sodium chloride 0.9 % 100 mL IVPB        (MAR Hold since Mon 06/05/2022 at 1006.Hold Reason: Transfer to a Procedural area)   2 g 200 mL/hr over 30 Minutes Intravenous Every 24 hours 06/04/22 2237     06/04/22 1000  cefTRIAXone (ROCEPHIN) 1  g in sodium chloride 0.9 % 100 mL IVPB  Status:  Discontinued        1 g 200 mL/hr over 30 Minutes Intravenous Daily 06/03/22  1458 06/03/22 1717   06/04/22 1000  cefTRIAXone (ROCEPHIN) 2 g in sodium chloride 0.9 % 100 mL IVPB  Status:  Discontinued        2 g 200 mL/hr over 30 Minutes Intravenous Daily 06/03/22 1718 06/03/22 1803   06/03/22 2200  metroNIDAZOLE (FLAGYL) IVPB 500 mg  Status:  Discontinued        500 mg 100 mL/hr over 60 Minutes Intravenous Every 12 hours 06/03/22 1717 06/03/22 1803   06/03/22 2100  metroNIDAZOLE (FLAGYL) IVPB 500 mg  Status:  Discontinued        500 mg 100 mL/hr over 60 Minutes Intravenous Every 8 hours 06/03/22 1458 06/03/22 1716   06/03/22 1830  piperacillin-tazobactam (ZOSYN) IVPB 3.375 g  Status:  Discontinued        3.375 g 12.5 mL/hr over 240 Minutes Intravenous Every 8 hours 06/03/22 1803 06/04/22 2237   06/03/22 1400  cefTRIAXone (ROCEPHIN) 2 g in sodium chloride 0.9 % 100 mL IVPB        2 g 200 mL/hr over 30 Minutes Intravenous  Once 06/03/22 1349 06/03/22 1502   06/03/22 1400  metroNIDAZOLE (FLAGYL) IVPB 500 mg        500 mg 100 mL/hr over 60 Minutes Intravenous  Once 06/03/22 1349 06/03/22 1536       Subjective: Patient seen and examined at bedside today.  Hemodynamically stable.  Comfortable overall.  Has some lower abdominal discomfort but not bad.  Waiting for ERCP.  Objective: Vitals:   06/04/22 1238 06/04/22 2046 06/05/22 0455 06/05/22 1012  BP: (!) 156/75 (!) 154/83 (!) 159/91 (!) 170/78  Pulse: 61 (!) 57 (!) 59 (!) 53  Resp: 18 20 20 15   Temp: 98.2 F (36.8 C) 98.3 F (36.8 C) 97.9 F (36.6 C) (!) 97.3 F (36.3 C)  TempSrc:    Tympanic  SpO2: 96% 99% 97% 98%  Weight:      Height:        Intake/Output Summary (Last 24 hours) at 06/05/2022 1144 Last data filed at 06/05/2022 0059 Gross per 24 hour  Intake 965.73 ml  Output 1775 ml  Net -809.27 ml   Filed Weights   06/03/22 1119 06/03/22 1709  Weight: 86.2 kg 86.8 kg    Examination:  General exam: Overall comfortable, not in distress HEENT: PERRL Respiratory system:  no wheezes or crackles   Cardiovascular system: S1 & S2 heard, RRR.  Gastrointestinal system: Abdomen is nondistended, soft and has some tenderness on the epigastric region Central nervous system: Alert and oriented Extremities: No edema, no clubbing ,no cyanosis Skin: No rashes, no ulcers,no icterus     Data Reviewed: I have personally reviewed following labs and imaging studies  CBC: Recent Labs  Lab 06/03/22 1211 06/04/22 0622 06/05/22 0637  WBC 14.4* 7.8 5.5  NEUTROABS 13.5*  --   --   HGB 14.3 13.2 15.5  HCT 43.5 40.7 47.6  MCV 83.7 86.2 84.7  PLT 220 156 193   Basic Metabolic Panel: Recent Labs  Lab 06/03/22 1211 06/04/22 0622 06/05/22 0637  NA 134* 137 135  K 3.7 3.3* 3.5  CL 101 105 102  CO2 24 21* 25  GLUCOSE 134* 66* 126*  BUN 27* 25* 11  CREATININE 0.92 0.70 0.74  CALCIUM 9.0 8.6* 9.5     Recent  Results (from the past 240 hour(s))  SARS Coronavirus 2 by RT PCR (hospital order, performed in [facility] hospital lab) *cepheid single result test* Anterior Nasal Swab     Status: None   Collection Time: 06/03/22 12:11 PM   Specimen: Anterior Nasal Swab  Result Value Ref Range Status   SARS Coronavirus 2 by RT PCR NEGATIVE NEGATIVE Final    Comment: (NOTE) SARS-CoV-2 target nucleic acids are NOT DETECTED.  The SARS-CoV-2 RNA is generally detectable in upper and lower respiratory specimens during the acute phase of infection. The lowest concentration of SARS-CoV-2 viral copies this assay can detect is 250 copies / mL. A negative result does not preclude SARS-CoV-2 infection and should not be used as the sole basis for treatment or other patient management decisions.  A negative result may occur with improper specimen collection / handling, submission of specimen other than nasopharyngeal swab, presence of viral mutation(s) within the areas targeted by this assay, and inadequate number of viral copies (<250 copies / mL). A negative result must be combined with  clinical observations, patient history, and epidemiological information.  Fact Sheet for Patients:   RoadLapTop.co.za  Fact Sheet for Healthcare Providers: http://kim-miller.com/  This test is not yet approved or  cleared by the [place] FDA and has been authorized for detection and/or diagnosis of SARS-CoV-2 by FDA under an Emergency Use Authorization (EUA).  This EUA will remain in effect (meaning this test can be used) for the duration of the COVID-19 declaration under Section 564(b)(1) of the Act, 21 U.S.C. section 360bbb-3(b)(1), unless the authorization is terminated or revoked sooner.  Performed at [facility], [address] [place], [place] [address]   Blood culture (routine x 2)     Status: None (Preliminary result)   Collection Time: 06/03/22 12:11 PM   Specimen: BLOOD  Result Value Ref Range Status   Specimen Description   Final    BLOOD LEFT ANTECUBITAL Performed at [facility], [address] [place], [place] [address]    Special Requests   Final    BOTTLES DRAWN AEROBIC AND ANAEROBIC Blood Culture adequate volume Performed at [facility], [address] [place], [place] [address]    Culture  Setup Time   Final    GRAM NEGATIVE RODS AEROBIC BOTTLE ONLY CRITICAL VALUE NOTED.  VALUE IS CONSISTENT WITH PREVIOUSLY REPORTED AND CALLED VALUE.    Culture   Final    GRAM NEGATIVE RODS IDENTIFICATION TO FOLLOW Performed at [facility], [address], [place], [place] [address]    Report Status PENDING  Incomplete  Blood culture (routine x 2)     Status: Abnormal (Preliminary result)   Collection Time: 06/03/22  2:12 PM   Specimen: BLOOD LEFT FOREARM  Result Value Ref Range Status   Specimen Description   Final    BLOOD LEFT FOREARM Performed at [facility], [address] [place], [place] [address]    Special Requests   Final    BOTTLES DRAWN AEROBIC AND  ANAEROBIC Blood Culture adequate volume Performed at [facility], [address] [place], [place] [address]    Culture  Setup Time   Final    ANAEROBIC BOTTLE ONLY GRAM NEGATIVE RODS CRITICAL RESULT CALLED TO, READ BACK BY AND VERIFIED WITH: PHARMD E. JACKSON 06/04/22 @ 2228 BY AB    Culture (A)  Final    ESCHERICHIA COLI SUSCEPTIBILITIES TO FOLLOW  Performed at [facility], [address] [place], [place] [address]    Report Status PENDING  Incomplete  Blood Culture ID Panel (Reflexed)     Status: Abnormal   Collection Time: 06/03/22  2:12 PM  Result Value Ref Range Status   Enterococcus faecalis NOT DETECTED NOT DETECTED Final   Enterococcus Faecium NOT DETECTED NOT DETECTED Final   Listeria monocytogenes NOT DETECTED NOT DETECTED Final   Staphylococcus species NOT DETECTED NOT DETECTED Final   Staphylococcus aureus (BCID) NOT DETECTED NOT DETECTED Final   Staphylococcus epidermidis NOT DETECTED NOT DETECTED Final   Staphylococcus lugdunensis NOT DETECTED NOT DETECTED Final   Streptococcus species NOT DETECTED NOT DETECTED Final   Streptococcus agalactiae NOT DETECTED NOT DETECTED Final   Streptococcus pneumoniae NOT DETECTED NOT DETECTED Final   Streptococcus pyogenes NOT DETECTED NOT DETECTED Final   A.calcoaceticus-baumannii NOT DETECTED NOT DETECTED Final   Bacteroides fragilis NOT DETECTED NOT DETECTED Final   Enterobacterales DETECTED (A) NOT DETECTED Final    Comment: Enterobacterales represent a large order of gram negative bacteria, not a single organism. CRITICAL RESULT CALLED TO, READ BACK BY AND VERIFIED WITH: PHARMD E. JACKSON 06/04/22 @ 2228 BY AB    Enterobacter cloacae complex NOT DETECTED NOT DETECTED Final   Escherichia coli DETECTED (A) NOT DETECTED Final    Comment: CRITICAL RESULT CALLED TO, READ BACK BY AND VERIFIED WITH: PHARMD E. JACKSON 06/04/22 @ 2228 BY AB    Klebsiella aerogenes NOT DETECTED NOT DETECTED Final   Klebsiella oxytoca  NOT DETECTED NOT DETECTED Final   Klebsiella pneumoniae NOT DETECTED NOT DETECTED Final   Proteus species NOT DETECTED NOT DETECTED Final   Salmonella species NOT DETECTED NOT DETECTED Final   Serratia marcescens NOT DETECTED NOT DETECTED Final   Haemophilus influenzae NOT DETECTED NOT DETECTED Final   Neisseria meningitidis NOT DETECTED NOT DETECTED Final   Pseudomonas aeruginosa NOT DETECTED NOT DETECTED Final   Stenotrophomonas maltophilia NOT DETECTED NOT DETECTED Final   Candida albicans NOT DETECTED NOT DETECTED Final   Candida auris NOT DETECTED NOT DETECTED Final   Candida glabrata NOT DETECTED NOT DETECTED Final   Candida krusei NOT DETECTED NOT DETECTED Final   Candida parapsilosis NOT DETECTED NOT DETECTED Final   Candida tropicalis NOT DETECTED NOT DETECTED Final   Cryptococcus neoformans/gattii NOT DETECTED NOT DETECTED Final   CTX-M ESBL NOT DETECTED NOT DETECTED Final   Carbapenem resistance IMP NOT DETECTED NOT DETECTED Final   Carbapenem resistance KPC NOT DETECTED NOT DETECTED Final   Carbapenem resistance NDM NOT DETECTED NOT DETECTED Final   Carbapenem resist OXA 48 LIKE NOT DETECTED NOT DETECTED Final   Carbapenem resistance VIM NOT DETECTED NOT DETECTED Final    Comment: Performed at [facility], [address] [place], [place] [address]     Radiology Studies: MR ABDOMEN MRCP W WO CONTAST  Result Date: 06/03/2022 CLINICAL DATA:  Cholangitis EXAM: MRI ABDOMEN WITHOUT AND WITH CONTRAST (INCLUDING MRCP) TECHNIQUE: Multiplanar multisequence MR imaging of the abdomen was performed both before and after the administration of intravenous contrast. Heavily T2-weighted images of the biliary and pancreatic ducts were obtained, and three-dimensional MRCP images were rendered by post processing. CONTRAST:  8mL GADAVIST GADOBUTROL 1 MMOL/ML IV SOLN COMPARISON:  Same day CT of the abdomen and pelvis dated Jun 03, 2022 FINDINGS: Lower chest: No acute findings. Hepatobiliary:  Mild heterogeneous enhancement of the right lobe of the liver seen on arterial phase, likely perfusional. No suspicious liver  lesions. Dilated gallbladder with mild gallbladder wall thickening. Mild intrahepatic and moderate common bile duct dilation, common bile duct measures up to 13 mm. Stone of the distal common bile duct measuring 8 mm on series 4, image 18. Pancreas: No mass, inflammatory changes, or other parenchymal abnormality identified. Spleen: Enhancing lesion of the anterior spleen with progressively fills in on delayed imaging measuring 2.7 cm on series 22, image 27, consistent with a benign hemangioma. Adrenals/Urinary Tract: Bilateral adrenal glands are unremarkable. No hydronephrosis. T2 hyperintense renal lesions with no evidence of enhancement, consistent with simple cysts, no specific follow-up imaging is recommended. Stomach/Bowel: Visualized portions within the abdomen are unremarkable. Vascular/Lymphatic: No pathologically enlarged lymph nodes identified. No abdominal aortic aneurysm demonstrated. Other:  None. Musculoskeletal: No suspicious bone lesions identified. IMPRESSION: 1. Common bile duct dilation and choledocholithiasis, 8 mm stone seen in the distal common bile duct. 2. Mild wall enhancement of the common bile duct, concerning for associated ascending cholangitis. 3. Dilated gallbladder with mild gallbladder wall thickening, concerning for cholecystitis. Electronically Signed   By: Allegra Lai M.D.   On: 06/03/2022 20:48   MR 3D Recon At Scanner  Result Date: 06/03/2022 CLINICAL DATA:  Cholangitis EXAM: MRI ABDOMEN WITHOUT AND WITH CONTRAST (INCLUDING MRCP) TECHNIQUE: Multiplanar multisequence MR imaging of the abdomen was performed both before and after the administration of intravenous contrast. Heavily T2-weighted images of the biliary and pancreatic ducts were obtained, and three-dimensional MRCP images were rendered by post processing. CONTRAST:  8mL GADAVIST GADOBUTROL  1 MMOL/ML IV SOLN COMPARISON:  Same day CT of the abdomen and pelvis dated Jun 03, 2022 FINDINGS: Lower chest: No acute findings. Hepatobiliary: Mild heterogeneous enhancement of the right lobe of the liver seen on arterial phase, likely perfusional. No suspicious liver lesions. Dilated gallbladder with mild gallbladder wall thickening. Mild intrahepatic and moderate common bile duct dilation, common bile duct measures up to 13 mm. Stone of the distal common bile duct measuring 8 mm on series 4, image 18. Pancreas: No mass, inflammatory changes, or other parenchymal abnormality identified. Spleen: Enhancing lesion of the anterior spleen with progressively fills in on delayed imaging measuring 2.7 cm on series 22, image 27, consistent with a benign hemangioma. Adrenals/Urinary Tract: Bilateral adrenal glands are unremarkable. No hydronephrosis. T2 hyperintense renal lesions with no evidence of enhancement, consistent with simple cysts, no specific follow-up imaging is recommended. Stomach/Bowel: Visualized portions within the abdomen are unremarkable. Vascular/Lymphatic: No pathologically enlarged lymph nodes identified. No abdominal aortic aneurysm demonstrated. Other:  None. Musculoskeletal: No suspicious bone lesions identified. IMPRESSION: 1. Common bile duct dilation and choledocholithiasis, 8 mm stone seen in the distal common bile duct. 2. Mild wall enhancement of the common bile duct, concerning for associated ascending cholangitis. 3. Dilated gallbladder with mild gallbladder wall thickening, concerning for cholecystitis. Electronically Signed   By: Allegra Lai M.D.   On: 06/03/2022 20:48   CT ABDOMEN PELVIS W CONTRAST  Result Date: 06/03/2022 CLINICAL DATA:  Sepsis concern for potential biliary duct obstruction or disease - n/v abdominal pain and fevers EXAM: CT ABDOMEN AND PELVIS WITH CONTRAST TECHNIQUE: Multidetector CT imaging of the abdomen and pelvis was performed using the standard protocol  following bolus administration of intravenous contrast. RADIATION DOSE REDUCTION: This exam was performed according to the departmental dose-optimization program which includes automated exposure control, adjustment of the mA and/or kV according to patient size and/or use of iterative reconstruction technique. CONTRAST:  OMNIPAQUE IOHEXOL 300 MG/ML  SOLN COMPARISON:  March 31, 2022, November 16, 2011 FINDINGS: Lower chest: No acute abnormality. Hepatobiliary: No new focal hepatic lesion. The gallbladder is distended with gallbladder wall thickening. No discrete adjacent fat stranding. However, there is mucosal enhancement throughout the gallbladder lumen as well as the common bile duct (series 5, image 60). Common bile duct is enlarged and measures 13 mm, previously 7 mm in 2022. No discrete choledocholithiasis are visualized. Mild central biliary ductal prominence, new since 2022. Pancreas: Unremarkable. No pancreatic ductal dilatation or surrounding inflammatory changes. Spleen: Revisualization of a peripherally enhancing mass within the lateral spleen, present since 2013 and most consistent with a benign hemangioma. Adrenals/Urinary Tract: Adrenal glands are unremarkable. Kidneys enhance symmetrically. No hydronephrosis. No obstructing nephrolithiasis. Bladder is unremarkable for degree of distension. Stomach/Bowel: No evidence of bowel obstruction. Moderate to large colonic stool burden throughout the colon. Duodenal diverticulum. Appendix is normal. Vascular/Lymphatic: Atherosclerotic calcifications of the nonaneurysmal abdominal aorta. No new lymphadenopathy. Reproductive: Prostatomegaly. Other: No free air or free fluid. Musculoskeletal: No acute or significant osseous findings. IMPRESSION: 1. There is new mild gallbladder wall thickening and increased mucosal enhancement throughout the gallbladder lumen as well as the common bile duct. Common bile duct is enlarged and measures 13 mm, previously 7 mm in  2022. No discrete choledocholithiasis are visualized. Given reported clinical history of sepsis, findings are concerning for ascending cholangitis of uncertain etiology. 2. Moderate to large colonic stool burden throughout the colon. Aortic Atherosclerosis (ICD10-I70.0). Electronically Signed   By: Meda Klinefelter M.D.   On: 06/03/2022 13:39   DG Chest 2 View  Result Date: 06/03/2022 CLINICAL DATA:  Dizziness.  Nausea vomiting and chills. EXAM: CHEST - 2 VIEW COMPARISON:  CTA chest, 02/19/2020. FINDINGS: Cardiac silhouette normal in size and configuration. Normal mediastinal and hilar contours. Clear lungs.  No pleural effusion or pneumothorax. Skeletal structures are intact. IMPRESSION: No active cardiopulmonary disease. Electronically Signed   By: Amie Portland M.D.   On: 06/03/2022 12:04    Scheduled Meds:  [MAR Hold] aspirin EC  81 mg Oral Daily   [MAR Hold] busPIRone  5 mg Oral QHS   [MAR Hold] busPIRone  7.5 mg Oral BID   [MAR Hold] enoxaparin (LOVENOX) injection  40 mg Subcutaneous Q24H   [MAR Hold] insulin aspart  0-9 Units Subcutaneous TID WC   [MAR Hold] lip balm   Topical BID   [MAR Hold] pantoprazole  40 mg Oral BID   [MAR Hold] polycarbophil  625 mg Oral BID   Continuous Infusions:  sodium chloride     [MAR Hold] cefTRIAXone (ROCEPHIN)  IV 2 g (06/05/22 0023)   dextrose 5 % and 0.9% NaCl 75 mL/hr at 06/04/22 2318   [MAR Hold] lactated ringers     lactated ringers 10 mL/hr at 06/05/22 1017   [MAR Hold] methocarbamol (ROBAXIN) IV     [MAR Hold] ondansetron (ZOFRAN) IV       LOS: 2 days   Burnadette Pop, MD [facility] P5/07/2022, 11:44 AM

## 2022-06-06 ENCOUNTER — Ambulatory Visit: Payer: Medicare (Managed Care) | Admitting: Family

## 2022-06-06 ENCOUNTER — Inpatient Hospital Stay (HOSPITAL_COMMUNITY): Payer: Medicare (Managed Care) | Admitting: Certified Registered"

## 2022-06-06 ENCOUNTER — Other Ambulatory Visit: Payer: Self-pay

## 2022-06-06 ENCOUNTER — Encounter (HOSPITAL_COMMUNITY)
Admission: EM | Disposition: A | Discharge status: Home / Self Care | Payer: Self-pay | Source: Home / Self Care | Attending: Internal Medicine

## 2022-06-06 ENCOUNTER — Encounter (HOSPITAL_COMMUNITY): Payer: Self-pay | Admitting: Internal Medicine

## 2022-06-06 DIAGNOSIS — I10 Essential (primary) hypertension: Secondary | ICD-10-CM

## 2022-06-06 DIAGNOSIS — K851 Biliary acute pancreatitis without necrosis or infection: Secondary | ICD-10-CM

## 2022-06-06 DIAGNOSIS — Z87891 Personal history of nicotine dependence: Secondary | ICD-10-CM

## 2022-06-06 DIAGNOSIS — I25119 Atherosclerotic heart disease of native coronary artery with unspecified angina pectoris: Secondary | ICD-10-CM

## 2022-06-06 DIAGNOSIS — K8043 Calculus of bile duct with acute cholecystitis with obstruction: Secondary | ICD-10-CM | POA: Diagnosis not present

## 2022-06-06 HISTORY — PX: CHOLECYSTECTOMY: SHX55

## 2022-06-06 LAB — GLUCOSE, CAPILLARY
Glucose-Capillary: 118 mg/dL — ABNORMAL HIGH (ref 70–99)
Glucose-Capillary: 122 mg/dL — ABNORMAL HIGH (ref 70–99)
Glucose-Capillary: 102 mg/dL — ABNORMAL HIGH (ref 70–99)
Glucose-Capillary: 115 mg/dL — ABNORMAL HIGH (ref 70–99)
Glucose-Capillary: 125 mg/dL — ABNORMAL HIGH (ref 70–99)

## 2022-06-06 LAB — CBC
HCT: 45.9 % (ref 39.0–52.0)
Hemoglobin: 15 g/dL (ref 13.0–17.0)
MCH: 28.1 pg (ref 26.0–34.0)
MCHC: 32.7 g/dL (ref 30.0–36.0)
MCV: 86 fL (ref 80.0–100.0)
Platelets: 193 10*3/uL (ref 150–400)
RBC: 5.34 MIL/uL (ref 4.22–5.81)
RDW: 14.4 % (ref 11.5–15.5)
WBC: 7.6 10*3/uL (ref 4.0–10.5)
nRBC: 0 % (ref 0.0–0.2)

## 2022-06-06 LAB — COMPREHENSIVE METABOLIC PANEL
ALT: 133 U/L — ABNORMAL HIGH (ref 0–44)
AST: 81 U/L — ABNORMAL HIGH (ref 15–41)
Albumin: 3 g/dL — ABNORMAL LOW (ref 3.5–5.0)
Alkaline Phosphatase: 422 U/L — ABNORMAL HIGH (ref 38–126)
Anion gap: 9 (ref 5–15)
BUN: 17 mg/dL (ref 8–23)
CO2: 25 mmol/L (ref 22–32)
Calcium: 8.9 mg/dL (ref 8.9–10.3)
Chloride: 102 mmol/L (ref 98–111)
Creatinine, Ser: 0.83 mg/dL (ref 0.61–1.24)
GFR, Estimated: >60 mL/min (ref >60)
Glucose, Bld: 138 mg/dL — ABNORMAL HIGH (ref 70–99)
Potassium: 4 mmol/L (ref 3.5–5.1)
Sodium: 136 mmol/L (ref 135–145)
Total Bilirubin: 1.7 mg/dL — ABNORMAL HIGH (ref 0.3–1.2)
Total Protein: 6.8 g/dL (ref 6.5–8.1)

## 2022-06-06 LAB — CULTURE, BLOOD (ROUTINE X 2)

## 2022-06-06 SURGERY — LAPAROSCOPIC CHOLECYSTECTOMY
Anesthesia: General

## 2022-06-06 MED ORDER — MIDAZOLAM HCL 2 MG/2ML IJ SOLN
INTRAMUSCULAR | Status: DC | PRN
  Administered 2022-06-06: 2 mg via INTRAVENOUS

## 2022-06-06 MED ORDER — FENTANYL CITRATE (PF) 100 MCG/2ML IJ SOLN
INTRAMUSCULAR | Status: AC
  Filled 2022-06-06: qty 2

## 2022-06-06 MED ORDER — DEXAMETHASONE SODIUM PHOSPHATE 10 MG/ML IJ SOLN
INTRAMUSCULAR | Status: AC
  Filled 2022-06-06: qty 1

## 2022-06-06 MED ORDER — FENTANYL CITRATE PF 50 MCG/ML IJ SOSY
PREFILLED_SYRINGE | INTRAMUSCULAR | Status: AC
  Administered 2022-06-06: 50 ug via INTRAVENOUS
  Filled 2022-06-06: qty 3

## 2022-06-06 MED ORDER — LACTATED RINGERS IR SOLN
Status: DC | PRN
  Administered 2022-06-06: 1000 mL

## 2022-06-06 MED ORDER — METHOCARBAMOL 500 MG IVPB - SIMPLE MED
INTRAVENOUS | Status: AC
  Administered 2022-06-06: 500 mg via INTRAVENOUS
  Filled 2022-06-06: qty 55

## 2022-06-06 MED ORDER — CHLORHEXIDINE GLUCONATE CLOTH 2 % EX PADS: 6.0000 | MEDICATED_PAD | Freq: Once | CUTANEOUS | Status: DC

## 2022-06-06 MED ORDER — OXYCODONE HCL 5 MG PO TABS
ORAL_TABLET | ORAL | Status: AC
  Filled 2022-06-06: qty 1

## 2022-06-06 MED ORDER — CHLORHEXIDINE GLUCONATE 0.12 % MT SOLN
15.0000 mL | Freq: Once | OROMUCOSAL | Status: AC
  Administered 2022-06-06: 15 mL via OROMUCOSAL

## 2022-06-06 MED ORDER — OXYCODONE HCL 5 MG PO TABS
5.0000 mg | ORAL_TABLET | Freq: Once | ORAL | Status: AC | PRN
  Administered 2022-06-06: 5 mg via ORAL

## 2022-06-06 MED ORDER — PROPOFOL 10 MG/ML IV BOLUS
INTRAVENOUS | Status: DC | PRN
  Administered 2022-06-06: 40 mg via INTRAVENOUS
  Administered 2022-06-06: 150 mg via INTRAVENOUS

## 2022-06-06 MED ORDER — OXYCODONE HCL 5 MG/5ML PO SOLN: 5.0000 mg | Freq: Once | ORAL | Status: AC | PRN

## 2022-06-06 MED ORDER — BUPIVACAINE HCL 0.25 % IJ SOLN
INTRAMUSCULAR | Status: DC | PRN
  Administered 2022-06-06: 30 mL

## 2022-06-06 MED ORDER — FENTANYL CITRATE PF 50 MCG/ML IJ SOSY
25.0000 ug | PREFILLED_SYRINGE | INTRAMUSCULAR | Status: DC | PRN
  Administered 2022-06-06 (×2): 50 ug via INTRAVENOUS

## 2022-06-06 MED ORDER — CEFAZOLIN SODIUM-DEXTROSE 2-4 GM/100ML-% IV SOLN
2.0000 g | INTRAVENOUS | Status: AC
  Administered 2022-06-06: 2 g via INTRAVENOUS
  Filled 2022-06-06: qty 100

## 2022-06-06 MED ORDER — DEXAMETHASONE SODIUM PHOSPHATE 10 MG/ML IJ SOLN
INTRAMUSCULAR | Status: DC | PRN
  Administered 2022-06-06: 4 mg via INTRAVENOUS

## 2022-06-06 MED ORDER — ONDANSETRON HCL 4 MG/2ML IJ SOLN
INTRAMUSCULAR | Status: DC | PRN
  Administered 2022-06-06: 4 mg via INTRAVENOUS

## 2022-06-06 MED ORDER — 0.9 % SODIUM CHLORIDE (POUR BTL) OPTIME
TOPICAL | Status: DC | PRN
  Administered 2022-06-06: 1000 mL

## 2022-06-06 MED ORDER — SUGAMMADEX SODIUM 200 MG/2ML IV SOLN
INTRAVENOUS | Status: DC | PRN
  Administered 2022-06-06: 200 mg via INTRAVENOUS

## 2022-06-06 MED ORDER — LACTATED RINGERS IV SOLN: INTRAVENOUS | Status: DC

## 2022-06-06 MED ORDER — HYDROMORPHONE HCL 1 MG/ML IJ SOLN
INTRAMUSCULAR | Status: AC
  Administered 2022-06-06: 0.5 mg via INTRAVENOUS
  Filled 2022-06-06: qty 1

## 2022-06-06 MED ORDER — ONDANSETRON HCL 4 MG/2ML IJ SOLN
INTRAMUSCULAR | Status: AC
  Filled 2022-06-06: qty 2

## 2022-06-06 MED ORDER — HYDROMORPHONE HCL 1 MG/ML IJ SOLN
INTRAMUSCULAR | Status: AC
  Administered 2022-06-06: 0.25 mg via INTRAVENOUS
  Filled 2022-06-06: qty 2

## 2022-06-06 MED ORDER — LABETALOL HCL 5 MG/ML IV SOLN
10.0000 mg | INTRAVENOUS | Status: DC | PRN
  Administered 2022-06-06: 10 mg via INTRAVENOUS
  Filled 2022-06-06: qty 4

## 2022-06-06 MED ORDER — FENTANYL CITRATE (PF) 100 MCG/2ML IJ SOLN
INTRAMUSCULAR | Status: DC | PRN
  Administered 2022-06-06 (×4): 50 ug via INTRAVENOUS

## 2022-06-06 MED ORDER — ACETAMINOPHEN 325 MG PO TABS
650.0000 mg | ORAL_TABLET | Freq: Four times a day (QID) | ORAL | Status: DC
  Administered 2022-06-06 – 2022-06-07 (×3): 650 mg via ORAL
  Filled 2022-06-06 (×3): qty 2

## 2022-06-06 MED ORDER — AMISULPRIDE (ANTIEMETIC) 5 MG/2ML IV SOLN
INTRAVENOUS | Status: AC
  Filled 2022-06-06: qty 4

## 2022-06-06 MED ORDER — MIDAZOLAM HCL 2 MG/2ML IJ SOLN
INTRAMUSCULAR | Status: AC
  Filled 2022-06-06: qty 2

## 2022-06-06 MED ORDER — HYDROMORPHONE HCL 1 MG/ML IJ SOLN
0.2500 mg | INTRAMUSCULAR | Status: DC | PRN
  Administered 2022-06-06: 0.5 mg via INTRAVENOUS

## 2022-06-06 MED ORDER — LIDOCAINE 2% (20 MG/ML) 5 ML SYRINGE
INTRAMUSCULAR | Status: DC | PRN
  Administered 2022-06-06: 100 mg via INTRAVENOUS

## 2022-06-06 MED ORDER — ROCURONIUM BROMIDE 10 MG/ML (PF) SYRINGE
PREFILLED_SYRINGE | INTRAVENOUS | Status: DC | PRN
  Administered 2022-06-06: 60 mg via INTRAVENOUS

## 2022-06-06 MED ORDER — HYDROMORPHONE HCL 1 MG/ML IJ SOLN
0.2500 mg | INTRAMUSCULAR | Status: DC | PRN
  Administered 2022-06-06: 0.5 mg via INTRAVENOUS
  Administered 2022-06-06: 0.25 mg via INTRAVENOUS
  Administered 2022-06-06 (×2): 0.5 mg via INTRAVENOUS

## 2022-06-06 MED ORDER — ISOSORBIDE MONONITRATE ER 30 MG PO TB24
30.0000 mg | ORAL_TABLET | Freq: Two times a day (BID) | ORAL | Status: DC
  Administered 2022-06-06: 30 mg via ORAL
  Filled 2022-06-06: qty 1

## 2022-06-06 MED ORDER — METHOCARBAMOL 500 MG IVPB - SIMPLE MED: 500.0000 mg | Freq: Once | INTRAVENOUS | Status: AC

## 2022-06-06 MED ORDER — BUPIVACAINE HCL (PF) 0.25 % IJ SOLN
INTRAMUSCULAR | Status: AC
  Filled 2022-06-06: qty 30

## 2022-06-06 MED ORDER — AMISULPRIDE (ANTIEMETIC) 5 MG/2ML IV SOLN
10.0000 mg | Freq: Once | INTRAVENOUS | Status: AC | PRN
  Administered 2022-06-06: 10 mg via INTRAVENOUS

## 2022-06-06 SURGICAL SUPPLY — 43 items
APPLIER CLIP 5 13 M/L LIGAMAX5 (MISCELLANEOUS) ×1
BAG COUNTER SPONGE SURGICOUNT (BAG) IMPLANT
CHLORAPREP W/TINT 26 (MISCELLANEOUS) ×1 IMPLANT
CLIP APPLIE 5 13 M/L LIGAMAX5 (MISCELLANEOUS) ×1 IMPLANT
COVER MAYO STAND XLG (MISCELLANEOUS) ×1 IMPLANT
COVER SURGICAL LIGHT HANDLE (MISCELLANEOUS) ×1 IMPLANT
DERMABOND ADVANCED .7 DNX12 (GAUZE/BANDAGES/DRESSINGS) ×1 IMPLANT
DRAPE C-ARM 42X120 X-RAY (DRAPES) IMPLANT
ELECT L-HOOK LAP 45CM DISP (ELECTROSURGICAL)
ELECT PENCIL ROCKER SW 15FT (MISCELLANEOUS) ×1 IMPLANT
ELECT REM PT RETURN 15FT ADLT (MISCELLANEOUS) ×1 IMPLANT
ELECTRODE L-HOOK LAP 45CM DISP (ELECTROSURGICAL) IMPLANT
ENDOLOOP SUT PDS II  0 18 (SUTURE) ×1
ENDOLOOP SUT PDS II 0 18 (SUTURE) IMPLANT
GLOVE BIOGEL PI IND STRL 6 (GLOVE) ×1 IMPLANT
GLOVE BIOGEL PI MICRO STRL 5.5 (GLOVE) ×1 IMPLANT
GOWN STRL REUS W/ TWL LRG LVL3 (GOWN DISPOSABLE) ×1 IMPLANT
GOWN STRL REUS W/TWL LRG LVL3 (GOWN DISPOSABLE) ×1
GRASPER SUT TROCAR 14GX15 (MISCELLANEOUS) IMPLANT
HEMOSTAT SNOW SURGICEL 2X4 (HEMOSTASIS) IMPLANT
IRRIG SUCT STRYKERFLOW 2 WTIP (MISCELLANEOUS) ×1
IRRIGATION SUCT STRKRFLW 2 WTP (MISCELLANEOUS) ×1 IMPLANT
KIT BASIN OR (CUSTOM PROCEDURE TRAY) ×1 IMPLANT
KIT TURNOVER KIT A (KITS) IMPLANT
L-HOOK LAP DISP 36CM (ELECTROSURGICAL) ×1
LHOOK LAP DISP 36CM (ELECTROSURGICAL) ×1 IMPLANT
NDL INSUFFLATION 14GA 120MM (NEEDLE) IMPLANT
NEEDLE INSUFFLATION 14GA 120MM (NEEDLE) IMPLANT
POUCH RETRIEVAL ECOSAC 10 (ENDOMECHANICALS) IMPLANT
SCISSORS LAP 5X35 DISP (ENDOMECHANICALS) ×1 IMPLANT
SET CHOLANGIOGRAPH MIX (MISCELLANEOUS) IMPLANT
SET TUBE SMOKE EVAC HIGH FLOW (TUBING) ×1 IMPLANT
SLEEVE Z-THREAD 5X100MM (TROCAR) ×2 IMPLANT
SPIKE FLUID TRANSFER (MISCELLANEOUS) ×1 IMPLANT
SUT MNCRL AB 4-0 PS2 18 (SUTURE) ×1 IMPLANT
SYS BAG RETRIEVAL 10MM (BASKET)
SYSTEM BAG RETRIEVAL 10MM (BASKET) IMPLANT
TOWEL OR 17X26 10 PK STRL BLUE (TOWEL DISPOSABLE) ×1 IMPLANT
TOWEL OR NON WOVEN STRL DISP B (DISPOSABLE) IMPLANT
TRAY LAPAROSCOPIC (CUSTOM PROCEDURE TRAY) ×1 IMPLANT
TROCAR ADV FIXATION 12X100MM (TROCAR) IMPLANT
TROCAR BALLN 12MMX100 BLUNT (TROCAR) IMPLANT
TROCAR Z-THREAD OPTICAL 5X100M (TROCAR) ×1 IMPLANT

## 2022-06-06 NOTE — Procedures (Signed)
Patient in OR but I talked to his wife who said no obvious post ERCP problems and no IntraOp cholangiogram was done based on surgical report and would follow liver tests back to normal as an outpatient please let me know if I could be of any further assistance with this hospital stay

## 2022-06-06 NOTE — Discharge Instructions (Signed)
[facility]  Please arrive at least 30 min before your appointment to complete your check in paperwork.  If you are unable to arrive 30 min prior to your appointment time we may have to cancel or reschedule you. LAPAROSCOPIC SURGERY: POST OP INSTRUCTIONS Always review your discharge instruction sheet given to you by the facility where your surgery was performed. IF YOU HAVE DISABILITY OR FAMILY LEAVE FORMS, YOU MUST BRING THEM TO THE OFFICE FOR PROCESSING.   DO NOT GIVE THEM TO YOUR DOCTOR.  PAIN CONTROL  First take acetaminophen (Tylenol) AND/or ibuprofen (Advil) to control your pain after surgery.  Follow directions on package.  Taking acetaminophen (Tylenol) and/or ibuprofen (Advil) regularly after surgery will help to control your pain and lower the amount of prescription pain medication you may need.  You should not take more than 4,000 mg (4 grams) of acetaminophen (Tylenol) in 24 hours.  You should not take ibuprofen (Advil), aleve, motrin, naprosyn or other NSAIDS if you have a history of stomach ulcers or chronic kidney disease.  A prescription for pain medication may be given to you upon discharge.  Take your pain medication as prescribed, if you still have uncontrolled pain after taking acetaminophen (Tylenol) or ibuprofen (Advil). Use ice packs to help control pain. If you need a refill on your pain medication, please contact your pharmacy.  They will contact our office to request authorization. Prescriptions will not be filled after 5pm or on week-ends.  HOME MEDICATIONS Take your usually prescribed medications unless otherwise directed.  DIET You should follow a light diet the first few days after arrival home.  Be sure to include lots of fluids daily. Avoid fatty, fried foods.   CONSTIPATION It is common to experience some constipation after surgery and if you are taking pain medication.  Increasing fluid intake and taking a stool softener (such as Colace)  will usually help or prevent this problem from occurring.  A mild laxative (Milk of Magnesia or Miralax) should be taken according to package instructions if there are no bowel movements after 48 hours.  WOUND/INCISION CARE Most patients will experience some swelling and bruising in the area of the incisions.  Ice packs will help.  Swelling and bruising can take several days to resolve.  Unless discharge instructions indicate otherwise, follow guidelines below  STERI-STRIPS - you may remove your outer bandages 48 hours after surgery, and you may shower at that time.  You have steri-strips (small skin tapes) in place directly over the incision.  These strips should be left on the skin for 7-10 days.   DERMABOND/SKIN GLUE - you may shower in 24 hours.  The glue will flake off over the next 2-3 weeks. Any sutures or staples will be removed at the office during your follow-up visit.  ACTIVITIES You may resume regular (light) daily activities beginning the next day--such as daily self-care, walking, climbing stairs--gradually increasing activities as tolerated.  You may have sexual intercourse when it is comfortable.  Refrain from any heavy lifting or straining until approved by your doctor. You may drive when you are no longer taking prescription pain medication, you can comfortably wear a seatbelt, and you can safely maneuver your car and apply brakes.  FOLLOW-UP You should see your doctor in the office for a follow-up appointment approximately 2-3 weeks after your surgery.  You should have been given your post-op/follow-up appointment when your surgery was scheduled.  If you did not receive a post-op/follow-up appointment, make sure   that you call for this appointment within a day or two after you arrive home to insure a convenient appointment time.   WHEN TO CALL YOUR DOCTOR: Fever over 101.0 Inability to urinate Continued bleeding from incision. Increased pain, redness, or drainage from the  incision. Increasing abdominal pain  The clinic staff is available to answer your questions during regular business hours.  Please don't hesitate to call and ask to speak to one of the nurses for clinical concerns.  If you have a medical emergency, go to the nearest emergency room or call 911.  A surgeon from [facility] is always on call at the hospital. [address], [address], [place], [place]  [address] ? [address] [address], [place], [place]   [address] [phone] ? [phone] ? FAX [phone]  

## 2022-06-06 NOTE — Progress Notes (Signed)
PROGRESS NOTE  Jeremy Ryan  ZOX:[id number] DOB: Oct 22, 1955 DOA: 06/03/2022 PCP: Olive Bass, FNP   Brief Narrative: Patient is a 67 y.o. male with medical history significant of hypertension, coronary artery disease, diabetes type 2, paroxysmal A-fib who presented to the emergency department with complaint of dizziness, night sweats, chills .  Patient was having the symptoms for last several months but presented to the emergency department after having severe chills, nausea/vomiting and epigastric abdominal discomfort.Lab work showed WC count of 14.4, alkaline phosphatase 577, AST of 300, ALT of 279.  CT abdomen/pelvis showed gallbladder thickening, findings concerning for ascending cholangitis.  MRCP confirmed choledocholithiasis, cholecystitis.  General surgery, GI following.  S/P ERCP, plan for lap chole.  Hospital course remarkable for E. coli bacteremia,on abx  Assessment & Plan:  Principal Problem:   Choledocholithiasis with obstruction Active Problems:   CAD in native artery   Essential hypertension   Gastro-esophageal reflux disease without esophagitis   Paroxysmal atrial fibrillation (HCC)   Type 2 diabetes mellitus without complication, without long-term current use of insulin (HCC)   Ascending cholangitis   Hyperbilirubinemia   Chronic nonalcoholic liver disease   History of esophageal stricture   Cholangitis/cholecystitis/choledocholithiasis: Presented with chills, leukocytosis, elevated liver enzymes.  CT abdomen/pelvis showed gallbladder distention, gallbladder wall thickening, dilated CBD.  No clear cholelithiasis seen. Findings were worrisome for ascending cholangitis.  Started on broad-spectrum antibiotics.MRCP confirmed choledocholithiasis, cholecystitis.  General surgery, GI following consulted Status post ERCP on 5/6, status post biliary sphincterectomy,no stone seen.  General surgery planning for laparoscopic cholecystectomy.Liver function significantly  improved.  Denies any abdominal pain today.  Sepsis/E. coli bacteremia: Secondary to ascending cholangitis.  Already on ceftriaxone.  Will change to [facility] on discharge   Coronary artery disease: Denies anginal symptoms.  Follows with cardiology at [facility].  Takes aspirin, Plavix. Holding Plavix for possible upcoming procedure.  Continue aspirin for now.   Type 2 diabetes: Takes Jardiance at home.  Currently on sliding scale insulin.  Monitor blood sugars   Hypertension: Takes Imdur, losartan , propranolol at home.  Was hypotensive on presentation.  Antihypertensives on hold.  Monitor blood pressure.   Paroxysmal A-fib: Currently in normal sinus rhythm.  Does not take any anticoagulant.  Only on aspirin and Plavix at home.  Med rec shows he takes propranolol but patient is unsure   History of hyperlipidemia: On Lipitor.  On  hold due to elevated liver enzymes   History of PUD/GERD: Takes Protonix at home.  Continue PPI            DVT prophylaxis:enoxaparin (LOVENOX) injection 40 mg Start: 06/03/22 2200     Code Status: Full Code  Family Communication: wife /daughter at bedside  Patient status:Inpatient  Patient is from :home  Anticipated discharge WJ:XBJY  Estimated DC date:tomorrow   Consultants: GI, general surgery  Procedures: ERCP  Antimicrobials:  Anti-infectives (From admission, onward)    Start     Dose/Rate Route Frequency Ordered Stop   06/05/22 0000  cefTRIAXone (ROCEPHIN) 2 g in sodium chloride 0.9 % 100 mL IVPB        2 g 200 mL/hr over 30 Minutes Intravenous Every 24 hours 06/04/22 2237     06/04/22 1000  cefTRIAXone (ROCEPHIN) 1 g in sodium chloride 0.9 % 100 mL IVPB  Status:  Discontinued        1 g 200 mL/hr over 30 Minutes Intravenous Daily 06/03/22 1458 06/03/22 1717   06/04/22 1000  cefTRIAXone (ROCEPHIN) 2 g in sodium  chloride 0.9 % 100 mL IVPB  Status:  Discontinued        2 g 200 mL/hr over 30 Minutes Intravenous Daily 06/03/22 1718  06/03/22 1803   06/03/22 2200  metroNIDAZOLE (FLAGYL) IVPB 500 mg  Status:  Discontinued        500 mg 100 mL/hr over 60 Minutes Intravenous Every 12 hours 06/03/22 1717 06/03/22 1803   06/03/22 2100  metroNIDAZOLE (FLAGYL) IVPB 500 mg  Status:  Discontinued        500 mg 100 mL/hr over 60 Minutes Intravenous Every 8 hours 06/03/22 1458 06/03/22 1716   06/03/22 1830  piperacillin-tazobactam (ZOSYN) IVPB 3.375 g  Status:  Discontinued        3.375 g 12.5 mL/hr over 240 Minutes Intravenous Every 8 hours 06/03/22 1803 06/04/22 2237   06/03/22 1400  cefTRIAXone (ROCEPHIN) 2 g in sodium chloride 0.9 % 100 mL IVPB        2 g 200 mL/hr over 30 Minutes Intravenous  Once 06/03/22 1349 06/03/22 1502   06/03/22 1400  metroNIDAZOLE (FLAGYL) IVPB 500 mg        500 mg 100 mL/hr over 60 Minutes Intravenous  Once 06/03/22 1349 06/03/22 1536       Subjective: Patient seen and examined at bedside today.  Hemodynamically stable.  Feels much better today.  No nausea or vomiting ,no abdominal pain.  Objective: Vitals:   06/05/22 1230 06/05/22 1250 06/05/22 2020 06/06/22 0552  BP: (!) 173/65 (!) 156/70 (!) 162/80 (!) 152/68  Pulse: 63 (!) 55 (!) 56 (!) 52  Resp: 18 18 16 16   Temp:  97.7 F (36.5 C) (!) 97.5 F (36.4 C) (!) 97.5 F (36.4 C)  TempSrc:  Oral Oral Oral  SpO2: 96% 95% 97% 99%  Weight:      Height:        Intake/Output Summary (Last 24 hours) at 06/06/2022 1106 Last data filed at 06/05/2022 1800 Gross per 24 hour  Intake 2485.52 ml  Output --  Net 2485.52 ml   Filed Weights   06/03/22 1119 06/03/22 1709  Weight: 86.2 kg 86.8 kg    Examination:  General exam: Overall comfortable, not in distress HEENT: PERRL Respiratory system:  no wheezes or crackles  Cardiovascular system: S1 & S2 heard, RRR.  Gastrointestinal system: Abdomen is nondistended, soft and nontender. Central nervous system: Alert and oriented Extremities: No edema, no clubbing ,no cyanosis Skin: No rashes, no  ulcers,no icterus     Data Reviewed: I have personally reviewed following labs and imaging studies  CBC: Recent Labs  Lab 06/03/22 1211 06/04/22 0622 06/05/22 0637 06/06/22 0528  WBC 14.4* 7.8 5.5 7.6  NEUTROABS 13.5*  --   --   --   HGB 14.3 13.2 15.5 15.0  HCT 43.5 40.7 47.6 45.9  MCV 83.7 86.2 84.7 86.0  PLT 220 156 193 193   Basic Metabolic Panel: Recent Labs  Lab 06/03/22 1211 06/04/22 0622 06/05/22 0637 06/06/22 0528  NA 134* 137 135 136  K 3.7 3.3* 3.5 4.0  CL 101 105 102 102  CO2 24 21* 25 25  GLUCOSE 134* 66* 126* 138*  BUN 27* 25* 11 17  CREATININE 0.92 0.70 0.74 0.83  CALCIUM 9.0 8.6* 9.5 8.9     Recent Results (from the past 240 hour(s))  SARS Coronavirus 2 by RT PCR (hospital order, performed in [facility] hospital lab) *cepheid single result test* Anterior Nasal Swab     Status: None  Collection Time: 06/03/22 12:11 PM   Specimen: Anterior Nasal Swab  Result Value Ref Range Status   SARS Coronavirus 2 by RT PCR NEGATIVE NEGATIVE Final    Comment: (NOTE) SARS-CoV-2 target nucleic acids are NOT DETECTED.  The SARS-CoV-2 RNA is generally detectable in upper and lower respiratory specimens during the acute phase of infection. The lowest concentration of SARS-CoV-2 viral copies this assay can detect is 250 copies / mL. A negative result does not preclude SARS-CoV-2 infection and should not be used as the sole basis for treatment or other patient management decisions.  A negative result may occur with improper specimen collection / handling, submission of specimen other than nasopharyngeal swab, presence of viral mutation(s) within the areas targeted by this assay, and inadequate number of viral copies (<250 copies / mL). A negative result must be combined with clinical observations, patient history, and epidemiological information.  Fact Sheet for Patients:   RoadLapTop.co.za  Fact Sheet for Healthcare  Providers: http://kim-miller.com/  This test is not yet approved or  cleared by the [place] FDA and has been authorized for detection and/or diagnosis of SARS-CoV-2 by FDA under an Emergency Use Authorization (EUA).  This EUA will remain in effect (meaning this test can be used) for the duration of the COVID-19 declaration under Section 564(b)(1) of the Act, 21 U.S.C. section 360bbb-3(b)(1), unless the authorization is terminated or revoked sooner.  Performed at [facility], [address] [place], [place] [address]   Blood culture (routine x 2)     Status: Abnormal   Collection Time: 06/03/22 12:11 PM   Specimen: BLOOD  Result Value Ref Range Status   Specimen Description   Final    BLOOD LEFT ANTECUBITAL Performed at [facility], [address] [place], [place] [address]    Special Requests   Final    BOTTLES DRAWN AEROBIC AND ANAEROBIC Blood Culture adequate volume Performed at [facility], [address] [place], [place] [address]    Culture  Setup Time   Final    GRAM NEGATIVE RODS AEROBIC BOTTLE ONLY CRITICAL VALUE NOTED.  VALUE IS CONSISTENT WITH PREVIOUSLY REPORTED AND CALLED VALUE.    Culture (A)  Final    ESCHERICHIA COLI SUSCEPTIBILITIES PERFORMED ON PREVIOUS CULTURE WITHIN THE LAST 5 DAYS. Performed at [facility], [address] [place], [place] [address]    Report Status 06/06/2022 FINAL  Final  Blood culture (routine x 2)     Status: Abnormal   Collection Time: 06/03/22  2:12 PM   Specimen: BLOOD LEFT FOREARM  Result Value Ref Range Status   Specimen Description   Final    BLOOD LEFT FOREARM Performed at [facility], [address], [place], [place] [address]    Special Requests   Final    BOTTLES DRAWN AEROBIC AND ANAEROBIC Blood Culture adequate volume Performed at [facility], [address] [place], [place] [address]    Culture  Setup Time   Final    IN BOTH  AEROBIC AND ANAEROBIC BOTTLES GRAM NEGATIVE RODS CRITICAL RESULT CALLED TO, READ BACK BY AND VERIFIED WITH: PHARMD E. JACKSON 06/04/22 @ 2228 BY AB Performed at [facility], [address] [place], [place] [address]    Culture ESCHERICHIA COLI (A)  Final   Report Status 06/06/2022 FINAL  Final   Organism ID, Bacteria ESCHERICHIA COLI  Final      Susceptibility  Escherichia coli - MIC*    AMPICILLIN >=32 RESISTANT Resistant     CEFEPIME <=0.12 SENSITIVE Sensitive     CEFTAZIDIME <=1 SENSITIVE Sensitive     CEFTRIAXONE <=0.25 SENSITIVE Sensitive     CIPROFLOXACIN <=0.25 SENSITIVE Sensitive     GENTAMICIN <=1 SENSITIVE Sensitive     IMIPENEM <=0.25 SENSITIVE Sensitive     TRIMETH/SULFA <=20 SENSITIVE Sensitive     AMPICILLIN/SULBACTAM >=32 RESISTANT Resistant     PIP/TAZO <=4 SENSITIVE Sensitive     * ESCHERICHIA COLI  Blood Culture ID Panel (Reflexed)     Status: Abnormal   Collection Time: 06/03/22  2:12 PM  Result Value Ref Range Status   Enterococcus faecalis NOT DETECTED NOT DETECTED Final   Enterococcus Faecium NOT DETECTED NOT DETECTED Final   Listeria monocytogenes NOT DETECTED NOT DETECTED Final   Staphylococcus species NOT DETECTED NOT DETECTED Final   Staphylococcus aureus (BCID) NOT DETECTED NOT DETECTED Final   Staphylococcus epidermidis NOT DETECTED NOT DETECTED Final   Staphylococcus lugdunensis NOT DETECTED NOT DETECTED Final   Streptococcus species NOT DETECTED NOT DETECTED Final   Streptococcus agalactiae NOT DETECTED NOT DETECTED Final   Streptococcus pneumoniae NOT DETECTED NOT DETECTED Final   Streptococcus pyogenes NOT DETECTED NOT DETECTED Final   A.calcoaceticus-baumannii NOT DETECTED NOT DETECTED Final   Bacteroides fragilis NOT DETECTED NOT DETECTED Final   Enterobacterales DETECTED (A) NOT DETECTED Final    Comment: Enterobacterales represent a large order of gram negative bacteria, not a single organism. CRITICAL RESULT CALLED TO, READ BACK BY  AND VERIFIED WITH: PHARMD E. JACKSON 06/04/22 @ 2228 BY AB    Enterobacter cloacae complex NOT DETECTED NOT DETECTED Final   Escherichia coli DETECTED (A) NOT DETECTED Final    Comment: CRITICAL RESULT CALLED TO, READ BACK BY AND VERIFIED WITH: PHARMD E. JACKSON 06/04/22 @ 2228 BY AB    Klebsiella aerogenes NOT DETECTED NOT DETECTED Final   Klebsiella oxytoca NOT DETECTED NOT DETECTED Final   Klebsiella pneumoniae NOT DETECTED NOT DETECTED Final   Proteus species NOT DETECTED NOT DETECTED Final   Salmonella species NOT DETECTED NOT DETECTED Final   Serratia marcescens NOT DETECTED NOT DETECTED Final   Haemophilus influenzae NOT DETECTED NOT DETECTED Final   Neisseria meningitidis NOT DETECTED NOT DETECTED Final   Pseudomonas aeruginosa NOT DETECTED NOT DETECTED Final   Stenotrophomonas maltophilia NOT DETECTED NOT DETECTED Final   Candida albicans NOT DETECTED NOT DETECTED Final   Candida auris NOT DETECTED NOT DETECTED Final   Candida glabrata NOT DETECTED NOT DETECTED Final   Candida krusei NOT DETECTED NOT DETECTED Final   Candida parapsilosis NOT DETECTED NOT DETECTED Final   Candida tropicalis NOT DETECTED NOT DETECTED Final   Cryptococcus neoformans/gattii NOT DETECTED NOT DETECTED Final   CTX-M ESBL NOT DETECTED NOT DETECTED Final   Carbapenem resistance IMP NOT DETECTED NOT DETECTED Final   Carbapenem resistance KPC NOT DETECTED NOT DETECTED Final   Carbapenem resistance NDM NOT DETECTED NOT DETECTED Final   Carbapenem resist OXA 48 LIKE NOT DETECTED NOT DETECTED Final   Carbapenem resistance VIM NOT DETECTED NOT DETECTED Final    Comment: Performed at [facility], [address], [place], [place] [address]     Radiology Studies: DG ERCP  Result Date: 06/05/2022 CLINICAL DATA:  ERCP for choledocholithiasis EXAM: ERCP TECHNIQUE: Multiple spot images obtained with the fluoroscopic device and submitted for interpretation post-procedure. FLUOROSCOPY TIME: FLUOROSCOPY TIME  3.3 mGy COMPARISON:  MRCP-06/03/2022 FINDINGS: Nine spot intraoperative fluoroscopic  images of the right upper abdominal quadrant during ERCP are provided for review. Initial image demonstrates an ERCP probe overlying the right upper abdominal quadrant. There is selective cannulation and opacification of the common bile duct which appears dilated centrally. Subsequent images demonstrate insufflation of a balloon within the central aspect of the CBD with subsequent biliary sweeping and presumed sphincterotomy. IMPRESSION: ERCP with biliary sweeping and presumed sphincterotomy as above. These images were submitted for radiologic interpretation only. Please see the procedural report for the amount of contrast and the fluoroscopy time utilized. Electronically Signed   By: Simonne Come M.D.   On: 06/05/2022 14:43    Scheduled Meds:  aspirin EC  81 mg Oral Daily   busPIRone  5 mg Oral QHS   busPIRone  7.5 mg Oral BID   enoxaparin (LOVENOX) injection  40 mg Subcutaneous Q24H   insulin aspart  0-9 Units Subcutaneous TID WC   isosorbide mononitrate  30 mg Oral BID   lip balm   Topical BID   pantoprazole  40 mg Oral BID   polycarbophil  625 mg Oral BID   Continuous Infusions:  cefTRIAXone (ROCEPHIN)  IV 2 g (06/05/22 2351)   dextrose 5 % and 0.9% NaCl 75 mL/hr at 06/05/22 1500   lactated ringers 10 mL/hr at 06/05/22 1017   methocarbamol (ROBAXIN) IV     ondansetron (ZOFRAN) IV       LOS: 3 days   Burnadette Pop, MD [facility] P5/07/2022, 11:06 AM

## 2022-06-06 NOTE — Anesthesia Preprocedure Evaluation (Addendum)
Anesthesia Evaluation  Patient identified by MRN, date of birth, ID band Patient awake    Reviewed: Allergy & Precautions, NPO status , Patient's Chart, lab work & pertinent test results, reviewed documented beta blocker date and time   Airway Mallampati: II  TM Distance: >3 FB Neck ROM: Full    Dental no notable dental hx. (+) Teeth Intact, Dental Advisory Given   Pulmonary former smoker   Pulmonary exam normal breath sounds clear to auscultation       Cardiovascular hypertension, Pt. on medications and Pt. on home beta blockers + angina  + CAD and + Cardiac Stents  Normal cardiovascular exam+ dysrhythmias Atrial Fibrillation  Rhythm:Regular Rate:Normal     Neuro/Psych negative neurological ROS  negative psych ROS   GI/Hepatic Neg liver ROS,GERD  ,,  Endo/Other  diabetes, Type 2    Renal/GU negative Renal ROSLab Results      Component                Value               Date                      CREATININE               0.83                06/06/2022                BUN                      17                  06/06/2022                NA                       136                 06/06/2022                K                        4.0                 06/06/2022                CL                       102                 06/06/2022                CO2                      25                  06/06/2022             negative genitourinary   Musculoskeletal negative musculoskeletal ROS (+)    Abdominal   Peds  Hematology Lab Results      Component                Value               Date  WBC                      7.6                 06/06/2022                HGB                      15.0                06/06/2022                HCT                      45.9                06/06/2022                MCV                      86.0                06/06/2022                PLT                      193                  06/06/2022              Anesthesia Other Findings   Reproductive/Obstetrics                             Anesthesia Physical Anesthesia Plan  ASA: 3  Anesthesia Plan: General   Post-op Pain Management: Precedex, Ofirmev IV (intra-op)* and Toradol IV (intra-op)*   Induction: Intravenous  PONV Risk Score and Plan: Midazolam, Dexamethasone, Ondansetron and Treatment may vary due to age or medical condition  Airway Management Planned: Oral ETT  Additional Equipment: None  Intra-op Plan:   Post-operative Plan: Extubation in OR  Informed Consent:      Dental advisory given  Plan Discussed with: CRNA  Anesthesia Plan Comments:        Anesthesia Quick Evaluation

## 2022-06-06 NOTE — Transfer of Care (Signed)
Immediate Anesthesia Transfer of Care Note  Patient: Jeremy Ryan  Procedure(s) Performed: LAPAROSCOPIC CHOLECYSTECTOMY  Patient Location: PACU  Anesthesia Type:General  Level of Consciousness: awake, alert , and oriented  Airway & Oxygen Therapy: Patient Spontanous Breathing and Patient connected to face mask oxygen  Post-op Assessment: Report given to RN, Post -op Vital signs reviewed and stable, and Patient moving all extremities X 4  Post vital signs: Reviewed and stable  Last Vitals:  Vitals Value Taken Time  BP 189/87   Temp    Pulse 60   Resp 18 06/06/22 1533  SpO2 100   Vitals shown include unvalidated device data.  Last Pain:  Vitals:   06/06/22 1359  TempSrc:   PainSc: 0-No pain         Complications: No notable events documented.

## 2022-06-06 NOTE — Progress Notes (Signed)
1 Day Post-Op   Subjective/Chief Complaint: Patient feels much better today. Denies abdominal pain. LFTs downtrending.  Objective: Vital signs in last 24 hours: Temp:  [97.3 F (36.3 C)-97.7 F (36.5 C)] 97.5 F (36.4 C) (05/07 0552) Pulse Rate:  [52-66] 52 (05/07 0552) Resp:  [13-18] 16 (05/07 0552) BP: (152-178)/(65-80) 152/68 (05/07 0552) SpO2:  [95 %-100 %] 99 % (05/07 0552) Last BM Date : 06/05/22  Intake/Output from previous day: 05/06 0701 - 05/07 0700 In: 2485.5 [P.O.:150; I.V.:2235.2; IV Piggyback:100.3] Out: -  Intake/Output this shift: No intake/output data recorded.   Alert and oriented, NAD Nonlabored respirations Abdomen is soft, nondistended, and nontender to palpation   Lab Results:  Recent Labs    06/05/22 0637 06/06/22 0528  WBC 5.5 7.6  HGB 15.5 15.0  HCT 47.6 45.9  PLT 193 193   BMET Recent Labs    06/05/22 0637 06/06/22 0528  NA 135 136  K 3.5 4.0  CL 102 102  CO2 25 25  GLUCOSE 126* 138*  BUN 11 17  CREATININE 0.74 0.83  CALCIUM 9.5 8.9   PT/INR Recent Labs    06/04/22 0622  LABPROT 14.7  INR 1.2   ABG No results for input(s): "PHART", "HCO3" in the last 72 hours.  Invalid input(s): "PCO2", "PO2"  Studies/Results: DG ERCP  Result Date: 06/05/2022 CLINICAL DATA:  ERCP for choledocholithiasis EXAM: ERCP TECHNIQUE: Multiple spot images obtained with the fluoroscopic device and submitted for interpretation post-procedure. FLUOROSCOPY TIME: FLUOROSCOPY TIME 3.3 mGy COMPARISON:  MRCP-06/03/2022 FINDINGS: Nine spot intraoperative fluoroscopic images of the right upper abdominal quadrant during ERCP are provided for review. Initial image demonstrates an ERCP probe overlying the right upper abdominal quadrant. There is selective cannulation and opacification of the common bile duct which appears dilated centrally. Subsequent images demonstrate insufflation of a balloon within the central aspect of the CBD with subsequent biliary sweeping  and presumed sphincterotomy. IMPRESSION: ERCP with biliary sweeping and presumed sphincterotomy as above. These images were submitted for radiologic interpretation only. Please see the procedural report for the amount of contrast and the fluoroscopy time utilized. Electronically Signed   By: Simonne Come M.D.   On: 06/05/2022 14:43    Anti-infectives: Anti-infectives (From admission, onward)    Start     Dose/Rate Route Frequency Ordered Stop   06/05/22 0000  cefTRIAXone (ROCEPHIN) 2 g in sodium chloride 0.9 % 100 mL IVPB        2 g 200 mL/hr over 30 Minutes Intravenous Every 24 hours 06/04/22 2237     06/04/22 1000  cefTRIAXone (ROCEPHIN) 1 g in sodium chloride 0.9 % 100 mL IVPB  Status:  Discontinued        1 g 200 mL/hr over 30 Minutes Intravenous Daily 06/03/22 1458 06/03/22 1717   06/04/22 1000  cefTRIAXone (ROCEPHIN) 2 g in sodium chloride 0.9 % 100 mL IVPB  Status:  Discontinued        2 g 200 mL/hr over 30 Minutes Intravenous Daily 06/03/22 1718 06/03/22 1803   06/03/22 2200  metroNIDAZOLE (FLAGYL) IVPB 500 mg  Status:  Discontinued        500 mg 100 mL/hr over 60 Minutes Intravenous Every 12 hours 06/03/22 1717 06/03/22 1803   06/03/22 2100  metroNIDAZOLE (FLAGYL) IVPB 500 mg  Status:  Discontinued        500 mg 100 mL/hr over 60 Minutes Intravenous Every 8 hours 06/03/22 1458 06/03/22 1716   06/03/22 1830  piperacillin-tazobactam (ZOSYN) IVPB 3.375 g  Status:  Discontinued        3.375 g 12.5 mL/hr over 240 Minutes Intravenous Every 8 hours 06/03/22 1803 06/04/22 2237   06/03/22 1400  cefTRIAXone (ROCEPHIN) 2 g in sodium chloride 0.9 % 100 mL IVPB        2 g 200 mL/hr over 30 Minutes Intravenous  Once 06/03/22 1349 06/03/22 1502   06/03/22 1400  metroNIDAZOLE (FLAGYL) IVPB 500 mg        500 mg 100 mL/hr over 60 Minutes Intravenous  Once 06/03/22 1349 06/03/22 1536       Assessment/Plan: Choledocholithiasis/ascending cholangitis - s/p ERCP with sphincterotomy 5/6, sludge  swept form the duct. LFTs downtrending, Tbili is now close to normal. Cholelithiasis - Proceed to OR today for laparoscopic cholecystectomy. I reviewed the procedure details with the patient including the benefits and risks of bleeding, infection, and <0.5% risk of common bile duct injury. He expressed understanding and agrees to proceed with surgery. E coli bacteremia secondary to above - on antibiotics Hold anticoagulation for surgery, keep NPO except meds.   LOS: 3 days    Sophronia Simas, MD [facility] General, Hepatobiliary and Pancreatic Surgery 06/06/22 9:28 AM

## 2022-06-06 NOTE — Anesthesia Procedure Notes (Signed)
Procedure Name: Intubation Date/Time: 06/06/2022 2:33 PM  Performed by: Nelle Don, CRNAPre-anesthesia Checklist: Emergency Drugs available, Patient identified, Suction available and Patient being monitored Patient Re-evaluated:Patient Re-evaluated prior to induction Oxygen Delivery Method: Circle system utilized Preoxygenation: Pre-oxygenation with 100% oxygen Induction Type: IV induction Ventilation: Mask ventilation without difficulty Laryngoscope Size: Mac and 4 Grade View: Grade I Tube type: Oral Tube size: 7.5 mm Number of attempts: 1 Airway Equipment and Method: Stylet Placement Confirmation: ETT inserted through vocal cords under direct vision, positive ETCO2 and breath sounds checked- equal and bilateral Secured at: 22 cm Tube secured with: Tape Dental Injury: Teeth and Oropharynx as per pre-operative assessment

## 2022-06-06 NOTE — Op Note (Signed)
Date: 06/06/22  Patient: Jeremy Ryan MRN: [id number]  Preoperative Diagnosis: Acute cholecystitis, choledocholithiasis Postoperative Diagnosis: Same  Procedure: Laparoscopic cholecystectomy  Surgeon: Sophronia Simas, MD  EBL: Minimal  Anesthesia: General endotracheal  Specimens: Gallbladder  Indications: [name] is a 67 yo male who presented with abdominal pain and chills, and was found to have elevated LFTs. An MRCP showed choledocholithiasis with acute cholecystitis. He underwent an ERCP with sphincterotomy and extraction of sludge from the common bile duct on 5/6, and LFTs have been downtrending. After a discussion of the risks and benefits of surgery, he agreed to proceed with cholecystectomy.  Findings: Mild gallbladder wall edema consistent with acute cholecystitis.  Procedure details: Informed consent was obtained in the preoperative area prior to the procedure. The patient was brought to the operating room and placed on the table in the supine position. General anesthesia was induced and appropriate lines and drains were placed for intraoperative monitoring. Perioperative antibiotics were administered per SCIP guidelines. The abdomen was prepped and draped in the usual sterile fashion. A pre-procedure timeout was taken verifying patient identity, surgical site and procedure to be performed.  A small umbilical skin incision was made, the subcutaneous tissue was divided with cautery, and the umbilical stalk was grasped and elevated. The fascia was incised and the peritoneal cavity was directly visualized. A 12mm Hassan trocar was placed and the abdomen was insufflated. The peritoneal cavity was inspected with no evidence of visceral or vascular injury. Three 5mm ports were placed in the right subcostal margin, all under direct visualization. The fundus of the gallbladder was grasped and retracted cephalad. The infundibulum was retracted laterally and the peritoneum overlying the  gallbladder was incised. The gallbladder wall was mildly edematous, consistent with acute cholecystitis. The cystic triangle was dissected out using cautery and blunt dissection, and the critical view of safety was obtained. The cystic duct and cystic artery were clipped and ligated. The cystic duct was mildly dilated and the clips did not completely cross it, so a PDS endoloop was placed on the cystic duct stump. The gallbladder was taken off the liver using cautery, and the specimen was placed in an endocatch bag. The surgical site was irrigated with saline until the effluent was clear. Hemostasis was achieved in the gallbladder fossa using cautery. The cystic duct and artery stumps were visually inspected and there was no evidence of bile leak or bleeding. The ports were removed under direct visualization and the abdomen was desufflated. The specimen was extracted via the umbilical port site. The umbilical port site fascia was closed with a 0 vicryl suture. The skin at all port sites was closed with 4-0 monocryl subcuticular suture. Dermabond was applied.  The patient tolerated the procedure well with no apparent complications. All counts were correct x2 at the end of the procedure. The patient was extubated and taken to PACU in stable condition.  Sophronia Simas, MD 06/06/22 3:34 PM

## 2022-06-06 NOTE — Anesthesia Postprocedure Evaluation (Signed)
Anesthesia Post Note  Patient: Jeremy Ryan  Procedure(s) Performed: LAPAROSCOPIC CHOLECYSTECTOMY     Patient location during evaluation: PACU Anesthesia Type: General Level of consciousness: awake Pain management: pain level controlled Vital Signs Assessment: post-procedure vital signs reviewed and stable Respiratory status: spontaneous breathing, nonlabored ventilation and respiratory function stable Cardiovascular status: blood pressure returned to baseline and stable (HTN but baseline) Postop Assessment: no apparent nausea or vomiting Anesthetic complications: no   No notable events documented.  Last Vitals:  Vitals:   06/06/22 1700 06/06/22 1715  BP: (!) 182/86 (!) 189/76  Pulse: (!) 57 (!) 54  Resp: 20 14  Temp:    SpO2: 100% 100%    Last Pain:  Vitals:   06/06/22 1645  TempSrc:   PainSc: 7                  Linton Rump

## 2022-06-07 ENCOUNTER — Encounter (HOSPITAL_COMMUNITY): Payer: Self-pay | Admitting: Surgery

## 2022-06-07 DIAGNOSIS — K8043 Calculus of bile duct with acute cholecystitis with obstruction: Secondary | ICD-10-CM | POA: Diagnosis not present

## 2022-06-07 LAB — CBC
HCT: 38.8 % — ABNORMAL LOW (ref 39.0–52.0)
Hemoglobin: 12.5 g/dL — ABNORMAL LOW (ref 13.0–17.0)
MCH: 27.7 pg (ref 26.0–34.0)
MCHC: 32.2 g/dL (ref 30.0–36.0)
MCV: 85.8 fL (ref 80.0–100.0)
Platelets: 190 10*3/uL (ref 150–400)
RBC: 4.52 MIL/uL (ref 4.22–5.81)
RDW: 14.4 % (ref 11.5–15.5)
WBC: 9.6 10*3/uL (ref 4.0–10.5)
nRBC: 0 % (ref 0.0–0.2)

## 2022-06-07 LAB — COMPREHENSIVE METABOLIC PANEL
ALT: 99 U/L — ABNORMAL HIGH (ref 0–44)
AST: 64 U/L — ABNORMAL HIGH (ref 15–41)
Albumin: 2.6 g/dL — ABNORMAL LOW (ref 3.5–5.0)
Alkaline Phosphatase: 318 U/L — ABNORMAL HIGH (ref 38–126)
Anion gap: 3 — ABNORMAL LOW (ref 5–15)
BUN: 15 mg/dL (ref 8–23)
CO2: 27 mmol/L (ref 22–32)
Calcium: 8.3 mg/dL — ABNORMAL LOW (ref 8.9–10.3)
Chloride: 106 mmol/L (ref 98–111)
Creatinine, Ser: 0.7 mg/dL (ref 0.61–1.24)
GFR, Estimated: >60 mL/min (ref >60)
Glucose, Bld: 127 mg/dL — ABNORMAL HIGH (ref 70–99)
Potassium: 3.7 mmol/L (ref 3.5–5.1)
Sodium: 136 mmol/L (ref 135–145)
Total Bilirubin: 1.2 mg/dL (ref 0.3–1.2)
Total Protein: 5.7 g/dL — ABNORMAL LOW (ref 6.5–8.1)

## 2022-06-07 LAB — GLUCOSE, CAPILLARY
Glucose-Capillary: 111 mg/dL — ABNORMAL HIGH (ref 70–99)
Glucose-Capillary: 127 mg/dL — ABNORMAL HIGH (ref 70–99)
Glucose-Capillary: 134 mg/dL — ABNORMAL HIGH (ref 70–99)

## 2022-06-07 MED ORDER — CEFDINIR 300 MG PO CAPS: 300.0000 mg | ORAL_CAPSULE | Freq: Two times a day (BID) | ORAL | Status: DC

## 2022-06-07 MED ORDER — ISOSORBIDE MONONITRATE ER 30 MG PO TB24: 30.0000 mg | ORAL_TABLET | Freq: Every day | ORAL | Status: DC

## 2022-06-07 MED ORDER — LOSARTAN POTASSIUM 25 MG PO TABS: 12.5000 mg | ORAL_TABLET | Freq: Every day | ORAL | Status: DC

## 2022-06-07 MED ORDER — OXYCODONE HCL 5 MG PO TABS: 5.0000 mg | ORAL_TABLET | Freq: Four times a day (QID) | ORAL | 0 refills | Status: DC | PRN

## 2022-06-07 MED ORDER — SULFAMETHOXAZOLE-TRIMETHOPRIM 800-160 MG PO TABS
1.0000 | ORAL_TABLET | Freq: Two times a day (BID) | ORAL | Status: DC
  Administered 2022-06-07: 1 via ORAL
  Filled 2022-06-07: qty 1

## 2022-06-07 MED ORDER — ACETAMINOPHEN 325 MG PO TABS: 650.0000 mg | ORAL_TABLET | Freq: Four times a day (QID) | ORAL | Status: AC | PRN

## 2022-06-07 MED ORDER — SULFAMETHOXAZOLE-TRIMETHOPRIM 800-160 MG PO TABS: 1.0000 | ORAL_TABLET | Freq: Two times a day (BID) | ORAL | 0 refills | Status: AC

## 2022-06-07 MED ORDER — CEFDINIR 300 MG PO CAPS: 300.0000 mg | ORAL_CAPSULE | Freq: Two times a day (BID) | ORAL | 0 refills | Status: DC

## 2022-06-07 NOTE — Progress Notes (Addendum)
[facility] Progress Note  1 Day Post-Op  Subjective: CC:  Overall doing well. Sharp RUQ pain resolved, now has abdominal "aching" worse with movement. Just went to the bathroom and washed up so he is sore now. He reports flatus. Denies nausea/vomiting. He currently lives at home with his daughter. He does all of his own yard work and is employed full time as a Education administrator.   Objective: Vital signs in last 24 hours: Temp:  [97.4 F (36.3 C)-98.2 F (36.8 C)] 98.2 F (36.8 C) (05/08 0412) Pulse Rate:  [48-57] 55 (05/07 1933) Resp:  [11-20] 18 (05/08 0412) BP: (97-193)/(53-92) 97/53 (05/08 0412) SpO2:  [98 %-100 %] 98 % (05/08 0412) Weight:  [86.8 kg] 86.8 kg (05/07 1359) Last BM Date : 06/03/22  Intake/Output from previous day: 05/07 0701 - 05/08 0700 In: 800 [I.V.:700; IV Piggyback:100] Out: -  Intake/Output this shift: No intake/output data recorded.  PE: Gen:  Alert, NAD, pleasant Card:  Regular rate and rhythm, pedal pulses 2+ BL Pulm:  Normal effort, clear to auscultation bilaterally Abd: Soft, approp tender, incisions c/d/I  Skin: warm and dry, no rashes  Psych: A&Ox3   Lab Results:  Recent Labs    06/06/22 0528 06/07/22 0516  WBC 7.6 9.6  HGB 15.0 12.5*  HCT 45.9 38.8*  PLT 193 190   BMET Recent Labs    06/06/22 0528 06/07/22 0516  NA 136 136  K 4.0 3.7  CL 102 106  CO2 25 27  GLUCOSE 138* 127*  BUN 17 15  CREATININE 0.83 0.70  CALCIUM 8.9 8.3*   PT/INR No results for input(s): "LABPROT", "INR" in the last 72 hours. CMP     Component Value Date/Time   NA 136 06/07/2022 0516   K 3.7 06/07/2022 0516   CL 106 06/07/2022 0516   CO2 27 06/07/2022 0516   GLUCOSE 127 (H) 06/07/2022 0516   BUN 15 06/07/2022 0516   CREATININE 0.70 06/07/2022 0516   CALCIUM 8.3 (L) 06/07/2022 0516   PROT 5.7 (L) 06/07/2022 0516   ALBUMIN 2.6 (L) 06/07/2022 0516   AST 64 (H) 06/07/2022 0516   ALT 99 (H) 06/07/2022 0516   ALKPHOS 318 (H) 06/07/2022 0516    BILITOT 1.2 06/07/2022 0516   GFRNONAA >60 06/07/2022 0516   Lipase     Component Value Date/Time   LIPASE 27 06/04/2022 0622       Studies/Results: DG ERCP  Result Date: 06/05/2022 CLINICAL DATA:  ERCP for choledocholithiasis EXAM: ERCP TECHNIQUE: Multiple spot images obtained with the fluoroscopic device and submitted for interpretation post-procedure. FLUOROSCOPY TIME: FLUOROSCOPY TIME 3.3 mGy COMPARISON:  MRCP-06/03/2022 FINDINGS: Nine spot intraoperative fluoroscopic images of the right upper abdominal quadrant during ERCP are provided for review. Initial image demonstrates an ERCP probe overlying the right upper abdominal quadrant. There is selective cannulation and opacification of the common bile duct which appears dilated centrally. Subsequent images demonstrate insufflation of a balloon within the central aspect of the CBD with subsequent biliary sweeping and presumed sphincterotomy. IMPRESSION: ERCP with biliary sweeping and presumed sphincterotomy as above. These images were submitted for radiologic interpretation only. Please see the procedural report for the amount of contrast and the fluoroscopy time utilized. Electronically Signed   By: Simonne Come M.D.   On: 06/05/2022 14:43    Anti-infectives: Anti-infectives (From admission, onward)    Start     Dose/Rate Route Frequency Ordered Stop   06/07/22 0600  ceFAZolin (ANCEF) IVPB 2g/100 mL premix  2 g 200 mL/hr over 30 Minutes Intravenous On call to O.R. 06/06/22 1352 06/06/22 1505   06/05/22 0000  cefTRIAXone (ROCEPHIN) 2 g in sodium chloride 0.9 % 100 mL IVPB        2 g 200 mL/hr over 30 Minutes Intravenous Every 24 hours 06/04/22 2237     06/04/22 1000  cefTRIAXone (ROCEPHIN) 1 g in sodium chloride 0.9 % 100 mL IVPB  Status:  Discontinued        1 g 200 mL/hr over 30 Minutes Intravenous Daily 06/03/22 1458 06/03/22 1717   06/04/22 1000  cefTRIAXone (ROCEPHIN) 2 g in sodium chloride 0.9 % 100 mL IVPB  Status:   Discontinued        2 g 200 mL/hr over 30 Minutes Intravenous Daily 06/03/22 1718 06/03/22 1803   06/03/22 2200  metroNIDAZOLE (FLAGYL) IVPB 500 mg  Status:  Discontinued        500 mg 100 mL/hr over 60 Minutes Intravenous Every 12 hours 06/03/22 1717 06/03/22 1803   06/03/22 2100  metroNIDAZOLE (FLAGYL) IVPB 500 mg  Status:  Discontinued        500 mg 100 mL/hr over 60 Minutes Intravenous Every 8 hours 06/03/22 1458 06/03/22 1716   06/03/22 1830  piperacillin-tazobactam (ZOSYN) IVPB 3.375 g  Status:  Discontinued        3.375 g 12.5 mL/hr over 240 Minutes Intravenous Every 8 hours 06/03/22 1803 06/04/22 2237   06/03/22 1400  cefTRIAXone (ROCEPHIN) 2 g in sodium chloride 0.9 % 100 mL IVPB        2 g 200 mL/hr over 30 Minutes Intravenous  Once 06/03/22 1349 06/03/22 1502   06/03/22 1400  metroNIDAZOLE (FLAGYL) IVPB 500 mg        500 mg 100 mL/hr over 60 Minutes Intravenous  Once 06/03/22 1349 06/03/22 1536        Assessment/Plan  Choledocholithiasis/ascending cholangitis - s/p ERCP with sphincterotomy 5/6, sludge swept form the duct.  Cholecystitis S/P laparoscopic cholecystectomy 5/7 Dr. Freida Busman LFTs downtrending, Tbili has normalized Ok to resume plavix Abx per primary team for E.Coli bacteremia, resistant to ampicillin, I do not see repeat Blood cx Stable for discharge from [facility] standpoint. Follow up, work note, and pain Rx provided.     LOS: 4 days   I reviewed nursing notes, Consultant GI notes, hospitalist notes, last 24 h vitals and pain scores, last 48 h intake and output, last 24 h labs and trends, and last 24 h imaging results.  Hosie Spangle, PA-C [facility] Please see Amion for pager number during day hours 7:00am-4:30pm

## 2022-06-07 NOTE — Plan of Care (Signed)
D/C orders received. Instructions reviewed with patient and spouse.

## 2022-06-07 NOTE — Discharge Summary (Addendum)
Physician Discharge Summary  CHAMBERS TUCKER ZOX:[id number] DOB: 12-Apr-1955 DOA: 06/03/2022  PCP: Olive Bass, FNP  Admit date: 06/03/2022 Discharge date: 06/07/2022  Admitted From: Home Disposition:  Home  Discharge Condition:Stable CODE STATUS:FULL Diet recommendation: Heart Healthy  Brief/Interim Summary: Patient is a 67 y.o. male with medical history significant of hypertension, coronary artery disease, diabetes type 2, paroxysmal A-fib who presented to the emergency department with complaint of dizziness, night sweats, chills .  Patient was having the symptoms for last several months but presented to the emergency department after having severe chills, nausea/vomiting and epigastric abdominal discomfort.Lab work showed WC count of 14.4, alkaline phosphatase 577, AST of 300, ALT of 279.  CT abdomen/pelvis showed gallbladder thickening, findings concerning for ascending cholangitis.  MRCP confirmed choledocholithiasis, cholecystitis.  General surgery, GI following.  S/P ERCP and lap chole.  Hospital course remarkable for E. coli bacteremia,abx changed to oral today.  Medically stable for discharge home today.  Following problems were addressed during the hospitalization:  Cholangitis/cholecystitis/choledocholithiasis: Presented with chills, leukocytosis, elevated liver enzymes.  CT abdomen/pelvis showed gallbladder distention, gallbladder wall thickening, dilated CBD.  No clear cholelithiasis seen. Findings were worrisome for ascending cholangitis.  Started on broad-spectrum antibiotics.MRCP confirmed choledocholithiasis, cholecystitis.  General surgery, GI following consulted Status post ERCP on 5/6, status post biliary sphincterectomy,no stone seen.  General surgery  did laparoscopic cholecystectomy.Liver function significantly improved.  Denies any abdominal pain today.  General surgery, GI cleared for discharge.  He will follow-up with general surgery as an outpatient.   Sepsis/E.  coli bacteremia: Secondary to ascending cholangitis.  Started  on ceftriaxone.  Will change to bactrim on discharge   Coronary artery disease: Denies anginal symptoms.  Follows with [facility]ology at [facility].  Takes aspirin, Plavix.   Type 2 diabetes: Takes Jardiance at home.   Monitor blood sugars   Hypertension: Takes Imdur, losartan , propranolol at home.  Was hypotensive on presentation .Home medications resumed at lower dose    Paroxysmal A-fib: Currently in normal sinus rhythm.  Does not take any anticoagulant.  Only on aspirin and Plavix at home.  Med rec shows he takes propranolol but patient is unsure   History of hyperlipidemia: On Lipitor.  Significant improvement in the liver enzymes so can be resumed on dc   History of PUD/GERD: Takes Protonix at home.         Discharge Diagnoses:  Principal Problem:   Choledocholithiasis with obstruction Active Problems:   CAD in native artery   Essential hypertension   Gastro-esophageal reflux disease without esophagitis   Paroxysmal atrial fibrillation (HCC)   Type 2 diabetes mellitus without complication, without long-term current use of insulin (HCC)   Ascending cholangitis   Hyperbilirubinemia   Chronic nonalcoholic liver disease   History of esophageal stricture    Discharge Instructions  Discharge Instructions     Diet - low sodium heart healthy   Complete by: As directed    Discharge instructions   Complete by: As directed    1)Please take prescribed medications as instructed 2)Follow up with your PCP in a week.  Do a CMP test to check your liver function during the follow-up 3) monitor blood pressure at home.  We have changed the doses of the blood pressure medications because your BP has been slightly low.  Please follow up with your PCP and discuss if the previous doses might need to be resumed 4)Follow up with general surgery as an outpatient.  Name and number the provider has been  attached   Increase activity slowly    Complete by: As directed       Allergies as of 06/07/2022   No Known Allergies      Medication List     STOP taking these medications    propranolol 40 MG tablet Commonly known as: INDERAL       TAKE these medications    acetaminophen 325 MG tablet Commonly known as: TYLENOL Take 2-3 tablets (650-975 mg total) by mouth every 6 (six) hours as needed.   aspirin EC 81 MG tablet Take 81 mg by mouth daily. Swallow whole.   atorvastatin 80 MG tablet Commonly known as: LIPITOR Take 1 tablet by mouth every evening.   BLACK CURRANT SEED OIL PO Take 1 capsule by mouth daily.   busPIRone 5 MG tablet Commonly known as: BUSPAR Take 5 mg by mouth at bedtime. What changed: Another medication with the same name was changed. Make sure you understand how and when to take each.   busPIRone 7.5 MG tablet Commonly known as: BUSPAR Take 1 in the am and 1 at lunchtime as directed What changed:  how much to take how to take this when to take this   Cinnamon 500 MG capsule Take 500 mg by mouth daily.   clopidogrel 75 MG tablet Commonly known as: PLAVIX Take 75 mg by mouth daily.   dicyclomine 20 MG tablet Commonly known as: BENTYL Take 1 tablet (20 mg total) by mouth 4 (four) times daily.   isosorbide mononitrate 30 MG 24 hr tablet Commonly known as: IMDUR Take 1 tablet (30 mg total) by mouth daily. What changed: when to take this   Jardiance 25 MG Tabs tablet Generic drug: empagliflozin Take 25 mg by mouth daily.   losartan 25 MG tablet Commonly known as: COZAAR Take 0.5 tablets (12.5 mg total) by mouth daily. What changed:  how much to take Another medication with the same name was removed. Continue taking this medication, and follow the directions you see here.   multivitamin with minerals tablet Take 1 tablet by mouth daily.   oxyCODONE 5 MG immediate release tablet Commonly known as: Oxy IR/ROXICODONE Take 1 tablet (5 mg total) by mouth every 6 (six)  hours as needed for moderate pain.   pantoprazole 40 MG tablet Commonly known as: PROTONIX TAKE 1 TABLET (40 MG TOTAL) BY MOUTH TWICE A DAY BEFORE MEALS What changed: See the new instructions.   sulfamethoxazole-trimethoprim 800-160 MG tablet Commonly known as: BACTRIM DS Take 1 tablet by mouth every 12 (twelve) hours for 4 days.         Follow-up Information     Maczis, Hedda Slade, PA-C Follow up on 07/04/2022.   Specialty: General Surgery Why: 9:45 am,Arrive 30 minutes prior to your appointment time, Please bring your insurance card and photo ID Contact information: [address] [facility] [place] [address] [phone]                No Known Allergies  Consultations: GI, general surgery   Procedures/Studies: DG ERCP  Result Date: 06/05/2022 CLINICAL DATA:  ERCP for choledocholithiasis EXAM: ERCP TECHNIQUE: Multiple spot images obtained with the fluoroscopic device and submitted for interpretation post-procedure. FLUOROSCOPY TIME: FLUOROSCOPY TIME 3.3 mGy COMPARISON:  MRCP-06/03/2022 FINDINGS: Nine spot intraoperative fluoroscopic images of the right upper abdominal quadrant during ERCP are provided for review. Initial image demonstrates an ERCP probe overlying the right upper abdominal quadrant. There is selective cannulation and opacification of  the common bile duct which appears dilated centrally. Subsequent images demonstrate insufflation of a balloon within the central aspect of the CBD with subsequent biliary sweeping and presumed sphincterotomy. IMPRESSION: ERCP with biliary sweeping and presumed sphincterotomy as above. These images were submitted for radiologic interpretation only. Please see the procedural report for the amount of contrast and the fluoroscopy time utilized. Electronically Signed   By: Simonne Come M.D.   On: 06/05/2022 14:43   MR ABDOMEN MRCP W WO CONTAST  Result Date: 06/03/2022 CLINICAL DATA:  Cholangitis  EXAM: MRI ABDOMEN WITHOUT AND WITH CONTRAST (INCLUDING MRCP) TECHNIQUE: Multiplanar multisequence MR imaging of the abdomen was performed both before and after the administration of intravenous contrast. Heavily T2-weighted images of the biliary and pancreatic ducts were obtained, and three-dimensional MRCP images were rendered by post processing. CONTRAST:  8mL GADAVIST GADOBUTROL 1 MMOL/ML IV SOLN COMPARISON:  Same day CT of the abdomen and pelvis dated Jun 03, 2022 FINDINGS: Lower chest: No acute findings. Hepatobiliary: Mild heterogeneous enhancement of the right lobe of the liver seen on arterial phase, likely perfusional. No suspicious liver lesions. Dilated gallbladder with mild gallbladder wall thickening. Mild intrahepatic and moderate common bile duct dilation, common bile duct measures up to 13 mm. Stone of the distal common bile duct measuring 8 mm on series 4, image 18. Pancreas: No mass, inflammatory changes, or other parenchymal abnormality identified. Spleen: Enhancing lesion of the anterior spleen with progressively fills in on delayed imaging measuring 2.7 cm on series 22, image 27, consistent with a benign hemangioma. Adrenals/Urinary Tract: Bilateral adrenal glands are unremarkable. No hydronephrosis. T2 hyperintense renal lesions with no evidence of enhancement, consistent with simple cysts, no specific follow-up imaging is recommended. Stomach/Bowel: Visualized portions within the abdomen are unremarkable. Vascular/Lymphatic: No pathologically enlarged lymph nodes identified. No abdominal aortic aneurysm demonstrated. Other:  None. Musculoskeletal: No suspicious bone lesions identified. IMPRESSION: 1. Common bile duct dilation and choledocholithiasis, 8 mm stone seen in the distal common bile duct. 2. Mild wall enhancement of the common bile duct, concerning for associated ascending cholangitis. 3. Dilated gallbladder with mild gallbladder wall thickening, concerning for cholecystitis.  Electronically Signed   By: Allegra Lai M.D.   On: 06/03/2022 20:48   MR 3D Recon At Scanner  Result Date: 06/03/2022 CLINICAL DATA:  Cholangitis EXAM: MRI ABDOMEN WITHOUT AND WITH CONTRAST (INCLUDING MRCP) TECHNIQUE: Multiplanar multisequence MR imaging of the abdomen was performed both before and after the administration of intravenous contrast. Heavily T2-weighted images of the biliary and pancreatic ducts were obtained, and three-dimensional MRCP images were rendered by post processing. CONTRAST:  8mL GADAVIST GADOBUTROL 1 MMOL/ML IV SOLN COMPARISON:  Same day CT of the abdomen and pelvis dated Jun 03, 2022 FINDINGS: Lower chest: No acute findings. Hepatobiliary: Mild heterogeneous enhancement of the right lobe of the liver seen on arterial phase, likely perfusional. No suspicious liver lesions. Dilated gallbladder with mild gallbladder wall thickening. Mild intrahepatic and moderate common bile duct dilation, common bile duct measures up to 13 mm. Stone of the distal common bile duct measuring 8 mm on series 4, image 18. Pancreas: No mass, inflammatory changes, or other parenchymal abnormality identified. Spleen: Enhancing lesion of the anterior spleen with progressively fills in on delayed imaging measuring 2.7 cm on series 22, image 27, consistent with a benign hemangioma. Adrenals/Urinary Tract: Bilateral adrenal glands are unremarkable. No hydronephrosis. T2 hyperintense renal lesions with no evidence of enhancement, consistent with simple cysts, no specific follow-up imaging is recommended. Stomach/Bowel: Visualized portions  within the abdomen are unremarkable. Vascular/Lymphatic: No pathologically enlarged lymph nodes identified. No abdominal aortic aneurysm demonstrated. Other:  None. Musculoskeletal: No suspicious bone lesions identified. IMPRESSION: 1. Common bile duct dilation and choledocholithiasis, 8 mm stone seen in the distal common bile duct. 2. Mild wall enhancement of the common bile  duct, concerning for associated ascending cholangitis. 3. Dilated gallbladder with mild gallbladder wall thickening, concerning for cholecystitis. Electronically Signed   By: Allegra Lai M.D.   On: 06/03/2022 20:48   CT ABDOMEN PELVIS W CONTRAST  Result Date: 06/03/2022 CLINICAL DATA:  Sepsis concern for potential biliary duct obstruction or disease - n/v abdominal pain and fevers EXAM: CT ABDOMEN AND PELVIS WITH CONTRAST TECHNIQUE: Multidetector CT imaging of the abdomen and pelvis was performed using the standard protocol following bolus administration of intravenous contrast. RADIATION DOSE REDUCTION: This exam was performed according to the departmental dose-optimization program which includes automated exposure control, adjustment of the mA and/or kV according to patient size and/or use of iterative reconstruction technique. CONTRAST:  OMNIPAQUE IOHEXOL 300 MG/ML  SOLN COMPARISON:  March 31, 2022, November 16, 2011 FINDINGS: Lower chest: No acute abnormality. Hepatobiliary: No new focal hepatic lesion. The gallbladder is distended with gallbladder wall thickening. No discrete adjacent fat stranding. However, there is mucosal enhancement throughout the gallbladder lumen as well as the common bile duct (series 5, image 60). Common bile duct is enlarged and measures 13 mm, previously 7 mm in 2022. No discrete choledocholithiasis are visualized. Mild central biliary ductal prominence, new since 2022. Pancreas: Unremarkable. No pancreatic ductal dilatation or surrounding inflammatory changes. Spleen: Revisualization of a peripherally enhancing mass within the lateral spleen, present since 2013 and most consistent with a benign hemangioma. Adrenals/Urinary Tract: Adrenal glands are unremarkable. Kidneys enhance symmetrically. No hydronephrosis. No obstructing nephrolithiasis. Bladder is unremarkable for degree of distension. Stomach/Bowel: No evidence of bowel obstruction. Moderate to large colonic stool  burden throughout the colon. Duodenal diverticulum. Appendix is normal. Vascular/Lymphatic: Atherosclerotic calcifications of the nonaneurysmal abdominal aorta. No new lymphadenopathy. Reproductive: Prostatomegaly. Other: No free air or free fluid. Musculoskeletal: No acute or significant osseous findings. IMPRESSION: 1. There is new mild gallbladder wall thickening and increased mucosal enhancement throughout the gallbladder lumen as well as the common bile duct. Common bile duct is enlarged and measures 13 mm, previously 7 mm in 2022. No discrete choledocholithiasis are visualized. Given reported clinical history of sepsis, findings are concerning for ascending cholangitis of uncertain etiology. 2. Moderate to large colonic stool burden throughout the colon. Aortic Atherosclerosis (ICD10-I70.0). Electronically Signed   By: Meda Klinefelter M.D.   On: 06/03/2022 13:39   DG Chest 2 View  Result Date: 06/03/2022 CLINICAL DATA:  Dizziness.  Nausea vomiting and chills. EXAM: CHEST - 2 VIEW COMPARISON:  CTA chest, 02/19/2020. FINDINGS: Cardiac silhouette normal in size and configuration. Normal mediastinal and hilar contours. Clear lungs.  No pleural effusion or pneumothorax. Skeletal structures are intact. IMPRESSION: No active cardiopulmonary disease. Electronically Signed   By: Amie Portland M.D.   On: 06/03/2022 12:04      Subjective: Patient seen and examined at bedside today.  Hemodynamically stable.  Comfortable.  Denies abdomen pain, nausea or vomiting.  Medically stable for discharge home today.  Discharge Exam: Vitals:   06/06/22 2018 06/07/22 0412  BP:  (!) 97/53  Pulse:    Resp:  18  Temp: 97.8 F (36.6 C) 98.2 F (36.8 C)  SpO2:  98%   Vitals:   06/06/22 1852 06/06/22 1933  06/06/22 2018 06/07/22 0412  BP: (!) 145/75 (!) 147/74  (!) 97/53  Pulse:  (!) 55    Resp:  12  18  Temp:   97.8 F (36.6 C) 98.2 F (36.8 C)  TempSrc:   Oral Oral  SpO2:  100%  98%  Weight:      Height:         General: Pt is alert, awake, not in acute distress Cardiovascular: RRR, S1/S2 +, no rubs, no gallops Respiratory: CTA bilaterally, no wheezing, no rhonchi Abdominal: Soft, NT, ND, bowel sounds + Extremities: no edema, no cyanosis    The results of significant diagnostics from this hospitalization (including imaging, microbiology, ancillary and laboratory) are listed below for reference.     Microbiology: Recent Results (from the past 240 hour(s))  SARS Coronavirus 2 by RT PCR (hospital order, performed in [facility] hospital lab) *cepheid single result test* Anterior Nasal Swab     Status: None   Collection Time: 06/03/22 12:11 PM   Specimen: Anterior Nasal Swab  Result Value Ref Range Status   SARS Coronavirus 2 by RT PCR NEGATIVE NEGATIVE Final    Comment: (NOTE) SARS-CoV-2 target nucleic acids are NOT DETECTED.  The SARS-CoV-2 RNA is generally detectable in upper and lower respiratory specimens during the acute phase of infection. The lowest concentration of SARS-CoV-2 viral copies this assay can detect is 250 copies / mL. A negative result does not preclude SARS-CoV-2 infection and should not be used as the sole basis for treatment or other patient management decisions.  A negative result may occur with improper specimen collection / handling, submission of specimen other than nasopharyngeal swab, presence of viral mutation(s) within the areas targeted by this assay, and inadequate number of viral copies (<250 copies / mL). A negative result must be combined with clinical observations, patient history, and epidemiological information.  Fact Sheet for Patients:   RoadLapTop.co.za  Fact Sheet for Healthcare Providers: http://kim-miller.com/  This test is not yet approved or  cleared by the [place] FDA and has been authorized for detection and/or diagnosis of SARS-CoV-2 by FDA under an Emergency Use Authorization  (EUA).  This EUA will remain in effect (meaning this test can be used) for the duration of the COVID-19 declaration under Section 564(b)(1) of the Act, 21 U.S.C. section 360bbb-3(b)(1), unless the authorization is terminated or revoked sooner.  Performed at [facility], [address] [place], [place] [address]   Blood culture (routine x 2)     Status: Abnormal   Collection Time: 06/03/22 12:11 PM   Specimen: BLOOD  Result Value Ref Range Status   Specimen Description   Final    BLOOD LEFT ANTECUBITAL Performed at [facility], [address] [place], [place] [address]    Special Requests   Final    BOTTLES DRAWN AEROBIC AND ANAEROBIC Blood Culture adequate volume Performed at [facility], [address] [place], [place] [address]    Culture  Setup Time   Final    GRAM NEGATIVE RODS AEROBIC BOTTLE ONLY CRITICAL VALUE NOTED.  VALUE IS CONSISTENT WITH PREVIOUSLY REPORTED AND CALLED VALUE.    Culture (A)  Final    ESCHERICHIA COLI SUSCEPTIBILITIES PERFORMED ON PREVIOUS CULTURE WITHIN THE LAST 5 DAYS. Performed at [facility], [address] [place], [place] [address]    Report Status 06/06/2022 FINAL  Final  Blood culture (routine x 2)     Status: Abnormal  Collection Time: 06/03/22  2:12 PM   Specimen: BLOOD LEFT FOREARM  Result Value Ref Range Status   Specimen Description   Final    BLOOD LEFT FOREARM Performed at [facility], [address], [place], [place] [address]    Special Requests   Final    BOTTLES DRAWN AEROBIC AND ANAEROBIC Blood Culture adequate volume Performed at [facility], [address] [place], [place] [address]    Culture  Setup Time   Final    IN BOTH AEROBIC AND ANAEROBIC BOTTLES GRAM NEGATIVE RODS CRITICAL RESULT CALLED TO, READ BACK BY AND VERIFIED WITH: PHARMD E. JACKSON 06/04/22 @ 2228 BY AB Performed at [facility], [address], [place], [place] [address]    Culture  ESCHERICHIA COLI (A)  Final   Report Status 06/06/2022 FINAL  Final   Organism ID, Bacteria ESCHERICHIA COLI  Final      Susceptibility   Escherichia coli - MIC*    AMPICILLIN >=32 RESISTANT Resistant     CEFEPIME <=0.12 SENSITIVE Sensitive     CEFTAZIDIME <=1 SENSITIVE Sensitive     CEFTRIAXONE <=0.25 SENSITIVE Sensitive     CIPROFLOXACIN <=0.25 SENSITIVE Sensitive     GENTAMICIN <=1 SENSITIVE Sensitive     IMIPENEM <=0.25 SENSITIVE Sensitive     TRIMETH/SULFA <=20 SENSITIVE Sensitive     AMPICILLIN/SULBACTAM >=32 RESISTANT Resistant     PIP/TAZO <=4 SENSITIVE Sensitive     * ESCHERICHIA COLI  Blood Culture ID Panel (Reflexed)     Status: Abnormal   Collection Time: 06/03/22  2:12 PM  Result Value Ref Range Status   Enterococcus faecalis NOT DETECTED NOT DETECTED Final   Enterococcus Faecium NOT DETECTED NOT DETECTED Final   Listeria monocytogenes NOT DETECTED NOT DETECTED Final   Staphylococcus species NOT DETECTED NOT DETECTED Final   Staphylococcus aureus (BCID) NOT DETECTED NOT DETECTED Final   Staphylococcus epidermidis NOT DETECTED NOT DETECTED Final   Staphylococcus lugdunensis NOT DETECTED NOT DETECTED Final   Streptococcus species NOT DETECTED NOT DETECTED Final   Streptococcus agalactiae NOT DETECTED NOT DETECTED Final   Streptococcus pneumoniae NOT DETECTED NOT DETECTED Final   Streptococcus pyogenes NOT DETECTED NOT DETECTED Final   A.calcoaceticus-baumannii NOT DETECTED NOT DETECTED Final   Bacteroides fragilis NOT DETECTED NOT DETECTED Final   Enterobacterales DETECTED (A) NOT DETECTED Final    Comment: Enterobacterales represent a large order of gram negative bacteria, not a single organism. CRITICAL RESULT CALLED TO, READ BACK BY AND VERIFIED WITH: PHARMD E. JACKSON 06/04/22 @ 2228 BY AB    Enterobacter cloacae complex NOT DETECTED NOT DETECTED Final   Escherichia coli DETECTED (A) NOT DETECTED Final    Comment: CRITICAL RESULT CALLED TO, READ BACK BY AND  VERIFIED WITH: PHARMD E. JACKSON 06/04/22 @ 2228 BY AB    Klebsiella aerogenes NOT DETECTED NOT DETECTED Final   Klebsiella oxytoca NOT DETECTED NOT DETECTED Final   Klebsiella pneumoniae NOT DETECTED NOT DETECTED Final   Proteus species NOT DETECTED NOT DETECTED Final   Salmonella species NOT DETECTED NOT DETECTED Final   Serratia marcescens NOT DETECTED NOT DETECTED Final   Haemophilus influenzae NOT DETECTED NOT DETECTED Final   Neisseria meningitidis NOT DETECTED NOT DETECTED Final   Pseudomonas aeruginosa NOT DETECTED NOT DETECTED Final   Stenotrophomonas maltophilia NOT DETECTED NOT DETECTED Final   Candida albicans NOT DETECTED NOT DETECTED Final   Candida auris NOT DETECTED NOT DETECTED Final   Candida glabrata NOT DETECTED  NOT DETECTED Final   Candida krusei NOT DETECTED NOT DETECTED Final   Candida parapsilosis NOT DETECTED NOT DETECTED Final   Candida tropicalis NOT DETECTED NOT DETECTED Final   Cryptococcus neoformans/gattii NOT DETECTED NOT DETECTED Final   CTX-M ESBL NOT DETECTED NOT DETECTED Final   Carbapenem resistance IMP NOT DETECTED NOT DETECTED Final   Carbapenem resistance KPC NOT DETECTED NOT DETECTED Final   Carbapenem resistance NDM NOT DETECTED NOT DETECTED Final   Carbapenem resist OXA 48 LIKE NOT DETECTED NOT DETECTED Final   Carbapenem resistance VIM NOT DETECTED NOT DETECTED Final    Comment: Performed at [facility], [address], [place], [place] [address]     Labs: BNP (last 3 results) No results for input(s): "BNP" in the last 8760 hours. Basic Metabolic Panel: Recent Labs  Lab 06/03/22 1211 06/04/22 0622 06/05/22 0637 06/06/22 0528 06/07/22 0516  NA 134* 137 135 136 136  K 3.7 3.3* 3.5 4.0 3.7  CL 101 105 102 102 106  CO2 24 21* 25 25 27   GLUCOSE 134* 66* 126* 138* 127*  BUN 27* 25* 11 17 15   CREATININE 0.92 0.70 0.74 0.83 0.70  CALCIUM 9.0 8.6* 9.5 8.9 8.3*   Liver Function Tests: Recent Labs  Lab 06/03/22 1211  06/04/22 0622 06/05/22 0637 06/06/22 0528 06/07/22 0516  AST 300* 195* 125* 81* 64*  ALT 279* 211* 184* 133* 99*  ALKPHOS 577* 421* 472* 422* 318*  BILITOT 5.7* 5.4* 2.3* 1.7* 1.2  PROT 7.4 6.6 7.9 6.8 5.7*  ALBUMIN 3.4* 3.0* 3.4* 3.0* 2.6*   Recent Labs  Lab 06/03/22 1211 06/04/22 0622  LIPASE 28 27   No results for input(s): "AMMONIA" in the last 168 hours. CBC: Recent Labs  Lab 06/03/22 1211 06/04/22 0622 06/05/22 0637 06/06/22 0528 06/07/22 0516  WBC 14.4* 7.8 5.5 7.6 9.6  NEUTROABS 13.5*  --   --   --   --   HGB 14.3 13.2 15.5 15.0 12.5*  HCT 43.5 40.7 47.6 45.9 38.8*  MCV 83.7 86.2 84.7 86.0 85.8  PLT 220 156 193 193 190   Cardiac Enzymes: No results for input(s): "CKTOTAL", "CKMB", "CKMBINDEX", "TROPONINI" in the last 168 hours. BNP: Invalid input(s): "POCBNP" CBG: Recent Labs  Lab 06/06/22 1850 06/06/22 2020 06/07/22 0047 06/07/22 0410 06/07/22 0739  GLUCAP 115* 125* 134* 127* 111*   D-Dimer No results for input(s): "DDIMER" in the last 72 hours. Hgb A1c No results for input(s): "HGBA1C" in the last 72 hours. Lipid Profile No results for input(s): "CHOL", "HDL", "LDLCALC", "TRIG", "CHOLHDL", "LDLDIRECT" in the last 72 hours. Thyroid function studies No results for input(s): "TSH", "T4TOTAL", "T3FREE", "THYROIDAB" in the last 72 hours.  Invalid input(s): "FREET3" Anemia work up No results for input(s): "VITAMINB12", "FOLATE", "FERRITIN", "TIBC", "IRON", "RETICCTPCT" in the last 72 hours. Urinalysis    Component Value Date/Time   COLORURINE AMBER (A) 06/03/2022 1211   APPEARANCEUR CLEAR 06/03/2022 1211   LABSPEC 1.010 06/03/2022 1211   PHURINE 7.0 06/03/2022 1211   GLUCOSEU >=500 (A) 06/03/2022 1211   HGBUR NEGATIVE 06/03/2022 1211   BILIRUBINUR LARGE (A) 06/03/2022 1211   KETONESUR NEGATIVE 06/03/2022 1211   PROTEINUR NEGATIVE 06/03/2022 1211   NITRITE NEGATIVE 06/03/2022 1211   LEUKOCYTESUR NEGATIVE 06/03/2022 1211   Sepsis  Labs Recent Labs  Lab 06/04/22 0622 06/05/22 0637 06/06/22 0528 06/07/22 0516  WBC 7.8 5.5 7.6 9.6   Microbiology Recent Results (from the past 240 hour(s))  SARS Coronavirus 2 by RT  PCR (hospital order, performed in [facility] hospital lab) *cepheid single result test* Anterior Nasal Swab     Status: None   Collection Time: 06/03/22 12:11 PM   Specimen: Anterior Nasal Swab  Result Value Ref Range Status   SARS Coronavirus 2 by RT PCR NEGATIVE NEGATIVE Final    Comment: (NOTE) SARS-CoV-2 target nucleic acids are NOT DETECTED.  The SARS-CoV-2 RNA is generally detectable in upper and lower respiratory specimens during the acute phase of infection. The lowest concentration of SARS-CoV-2 viral copies this assay can detect is 250 copies / mL. A negative result does not preclude SARS-CoV-2 infection and should not be used as the sole basis for treatment or other patient management decisions.  A negative result may occur with improper specimen collection / handling, submission of specimen other than nasopharyngeal swab, presence of viral mutation(s) within the areas targeted by this assay, and inadequate number of viral copies (<250 copies / mL). A negative result must be combined with clinical observations, patient history, and epidemiological information.  Fact Sheet for Patients:   RoadLapTop.co.za  Fact Sheet for Healthcare Providers: http://kim-miller.com/  This test is not yet approved or  cleared by the [place] FDA and has been authorized for detection and/or diagnosis of SARS-CoV-2 by FDA under an Emergency Use Authorization (EUA).  This EUA will remain in effect (meaning this test can be used) for the duration of the COVID-19 declaration under Section 564(b)(1) of the Act, 21 U.S.C. section 360bbb-3(b)(1), unless the authorization is terminated or revoked sooner.  Performed at [facility], [address] [place], [place] [address]   Blood culture (routine x 2)     Status: Abnormal   Collection Time: 06/03/22 12:11 PM   Specimen: BLOOD  Result Value Ref Range Status   Specimen Description   Final    BLOOD LEFT ANTECUBITAL Performed at [facility], [address] [place], [place] [address]    Special Requests   Final    BOTTLES DRAWN AEROBIC AND ANAEROBIC Blood Culture adequate volume Performed at [facility], [address] [place], [place] [address]    Culture  Setup Time   Final    GRAM NEGATIVE RODS AEROBIC BOTTLE ONLY CRITICAL VALUE NOTED.  VALUE IS CONSISTENT WITH PREVIOUSLY REPORTED AND CALLED VALUE.    Culture (A)  Final    ESCHERICHIA COLI SUSCEPTIBILITIES PERFORMED ON PREVIOUS CULTURE WITHIN THE LAST 5 DAYS. Performed at [facility], [address], [place], [place] [address]    Report Status 06/06/2022 FINAL  Final  Blood culture (routine x 2)     Status: Abnormal   Collection Time: 06/03/22  2:12 PM   Specimen: BLOOD LEFT FOREARM  Result Value Ref Range Status   Specimen Description   Final    BLOOD LEFT FOREARM Performed at [facility], [address] [place], [place] [address]    Special Requests   Final    BOTTLES DRAWN AEROBIC AND ANAEROBIC Blood Culture adequate volume Performed at [facility], [address] [place], [place] [address]    Culture  Setup Time   Final    IN BOTH AEROBIC AND ANAEROBIC BOTTLES GRAM NEGATIVE RODS CRITICAL RESULT CALLED TO, READ BACK BY AND VERIFIED WITH: PHARMD E. JACKSON 06/04/22 @ 2228 BY AB Performed at [facility], [address], [place], [place] [address]    Culture ESCHERICHIA COLI (A)  Final  Report Status 06/06/2022 FINAL  Final   Organism ID, Bacteria ESCHERICHIA COLI  Final      Susceptibility   Escherichia coli - MIC*    AMPICILLIN >=32 RESISTANT Resistant     CEFEPIME <=0.12 SENSITIVE Sensitive     CEFTAZIDIME <=1 SENSITIVE Sensitive     CEFTRIAXONE  <=0.25 SENSITIVE Sensitive     CIPROFLOXACIN <=0.25 SENSITIVE Sensitive     GENTAMICIN <=1 SENSITIVE Sensitive     IMIPENEM <=0.25 SENSITIVE Sensitive     TRIMETH/SULFA <=20 SENSITIVE Sensitive     AMPICILLIN/SULBACTAM >=32 RESISTANT Resistant     PIP/TAZO <=4 SENSITIVE Sensitive     * ESCHERICHIA COLI  Blood Culture ID Panel (Reflexed)     Status: Abnormal   Collection Time: 06/03/22  2:12 PM  Result Value Ref Range Status   Enterococcus faecalis NOT DETECTED NOT DETECTED Final   Enterococcus Faecium NOT DETECTED NOT DETECTED Final   Listeria monocytogenes NOT DETECTED NOT DETECTED Final   Staphylococcus species NOT DETECTED NOT DETECTED Final   Staphylococcus aureus (BCID) NOT DETECTED NOT DETECTED Final   Staphylococcus epidermidis NOT DETECTED NOT DETECTED Final   Staphylococcus lugdunensis NOT DETECTED NOT DETECTED Final   Streptococcus species NOT DETECTED NOT DETECTED Final   Streptococcus agalactiae NOT DETECTED NOT DETECTED Final   Streptococcus pneumoniae NOT DETECTED NOT DETECTED Final   Streptococcus pyogenes NOT DETECTED NOT DETECTED Final   A.calcoaceticus-baumannii NOT DETECTED NOT DETECTED Final   Bacteroides fragilis NOT DETECTED NOT DETECTED Final   Enterobacterales DETECTED (A) NOT DETECTED Final    Comment: Enterobacterales represent a large order of gram negative bacteria, not a single organism. CRITICAL RESULT CALLED TO, READ BACK BY AND VERIFIED WITH: PHARMD E. JACKSON 06/04/22 @ 2228 BY AB    Enterobacter cloacae complex NOT DETECTED NOT DETECTED Final   Escherichia coli DETECTED (A) NOT DETECTED Final    Comment: CRITICAL RESULT CALLED TO, READ BACK BY AND VERIFIED WITH: PHARMD E. JACKSON 06/04/22 @ 2228 BY AB    Klebsiella aerogenes NOT DETECTED NOT DETECTED Final   Klebsiella oxytoca NOT DETECTED NOT DETECTED Final   Klebsiella pneumoniae NOT DETECTED NOT DETECTED Final   Proteus species NOT DETECTED NOT DETECTED Final   Salmonella species NOT DETECTED NOT  DETECTED Final   Serratia marcescens NOT DETECTED NOT DETECTED Final   Haemophilus influenzae NOT DETECTED NOT DETECTED Final   Neisseria meningitidis NOT DETECTED NOT DETECTED Final   Pseudomonas aeruginosa NOT DETECTED NOT DETECTED Final   Stenotrophomonas maltophilia NOT DETECTED NOT DETECTED Final   Candida albicans NOT DETECTED NOT DETECTED Final   Candida auris NOT DETECTED NOT DETECTED Final   Candida glabrata NOT DETECTED NOT DETECTED Final   Candida krusei NOT DETECTED NOT DETECTED Final   Candida parapsilosis NOT DETECTED NOT DETECTED Final   Candida tropicalis NOT DETECTED NOT DETECTED Final   Cryptococcus neoformans/gattii NOT DETECTED NOT DETECTED Final   CTX-M ESBL NOT DETECTED NOT DETECTED Final   Carbapenem resistance IMP NOT DETECTED NOT DETECTED Final   Carbapenem resistance KPC NOT DETECTED NOT DETECTED Final   Carbapenem resistance NDM NOT DETECTED NOT DETECTED Final   Carbapenem resist OXA 48 LIKE NOT DETECTED NOT DETECTED Final   Carbapenem resistance VIM NOT DETECTED NOT DETECTED Final    Comment: Performed at [facility], [address], [place], [place] [address]    Please note: You were cared for by a hospitalist during your hospital stay. Once you are discharged, your primary care physician will  handle any further medical issues. Please note that NO REFILLS for any discharge medications will be authorized once you are discharged, as it is imperative that you return to your primary care physician (or establish a relationship with a primary care physician if you do not have one) for your post hospital discharge needs so that they can reassess your need for medications and monitor your lab values.    Time coordinating discharge: 40 minutes  SIGNED:   Burnadette Pop, MD  [facility] 06/07/2022, 11:03 AM Pager [phone]  If 7PM-7AM, please contact night-coverage [email or web address] Password [email or web address]

## 2022-06-08 ENCOUNTER — Encounter (HOSPITAL_COMMUNITY): Payer: Self-pay | Admitting: Gastroenterology

## 2022-06-08 ENCOUNTER — Telehealth: Payer: Self-pay

## 2022-06-08 LAB — SURGICAL PATHOLOGY

## 2022-06-08 NOTE — Transitions of Care (Post Inpatient/ED Visit) (Signed)
06/08/2022  Name: Jeremy Ryan MRN: [id number] DOB: July 18, 1955  Today's TOC FU Call Status: Today's TOC FU Call Status:: Successful TOC FU Call Competed TOC FU Call Complete Date: 06/08/22  Transition Care Management Follow-up Telephone Call Date of Discharge: 06/07/22 Discharge Facility: [facility]) Type of Discharge: Inpatient Admission Primary Inpatient Discharge Diagnosis:: disease of biliary tract How have you been since you were released from the hospital?: Better Any questions or concerns?: No  Items Reviewed: Did you receive and understand the discharge instructions provided?: Yes Medications obtained,verified, and reconciled?: Yes (Medications Reviewed) Any new allergies since your discharge?: No Dietary orders reviewed?: Yes Do you have support at home?: Yes People in Home: spouse  Medications Reviewed Today: Medications Reviewed Today     Reviewed by Karena Addison, LPN (Licensed Practical Nurse) on 06/08/22 at 1027  Med List Status: <None>   Medication Order Taking? Sig Documenting Provider Last Dose Status Informant  acetaminophen (TYLENOL) 325 MG tablet [id number] Yes Take 2-3 tablets (650-975 mg total) by mouth every 6 (six) hours as needed. Adam Phenix, PA-C Taking Active   aspirin EC 81 MG tablet [id number] Yes Take 81 mg by mouth daily. Swallow whole. [provider] Taking Active Self, Spouse/Significant Other  atorvastatin (LIPITOR) 80 MG tablet [id number] Yes Take 1 tablet by mouth every evening. [provider] Taking Active Self, Spouse/Significant Other  BLACK CURRANT SEED OIL PO [id number] Yes Take 1 capsule by mouth daily. [provider] Taking Active Self, Spouse/Significant Other  busPIRone (BUSPAR) 5 MG tablet [id number] Yes Take 5 mg by mouth at bedtime. [provider] Taking Active Self, Spouse/Significant Other  busPIRone (BUSPAR) 7.5 MG tablet [id number] Yes Take 1 in the am and 1 at lunchtime as  directed  Patient taking differently: Take 7.5 mg by mouth See admin instructions. Take 1 in the am and 1 at lunchtime as directed   Olive Bass, FNP Taking Active Self, Spouse/Significant Other  Cinnamon 500 MG capsule [id number] Yes Take 500 mg by mouth daily. [provider] Taking Active Self, Spouse/Significant Other  clopidogrel (PLAVIX) 75 MG tablet [id number] Yes Take 75 mg by mouth daily. [provider] Taking Active Self, Spouse/Significant Other  dicyclomine (BENTYL) 20 MG tablet [id number] No Take 1 tablet (20 mg total) by mouth 4 (four) times daily.  Patient not taking: Reported on 06/08/2022   Olive Bass, FNP Not Taking Active Self, Spouse/Significant Other           Med Note Lenor Derrick   [facility] Jun 03, 2022  8:05 PM) Ran out  isosorbide mononitrate (IMDUR) 30 MG 24 hr tablet [id number] Yes Take 1 tablet (30 mg total) by mouth daily. Burnadette Pop, MD Taking Active   JARDIANCE 25 MG TABS tablet [id number] Yes Take 25 mg by mouth daily. [provider] Taking Active Self, Spouse/Significant Other  losartan (COZAAR) 25 MG tablet [id number] Yes Take 0.5 tablets (12.5 mg total) by mouth daily. Burnadette Pop, MD Taking Active   Multiple Vitamins-Minerals (MULTIVITAMIN WITH MINERALS) tablet [id number] Yes Take 1 tablet by mouth daily. [provider] Taking Active Self, Spouse/Significant Other  oxyCODONE (OXY IR/ROXICODONE) 5 MG immediate release tablet [id number] Yes Take 1 tablet (5 mg total) by mouth every 6 (six) hours as needed for moderate pain. Adam Phenix, PA-C Taking Active   pantoprazole (PROTONIX) 40 MG tablet [id number] Yes TAKE 1 TABLET (40 MG TOTAL) BY MOUTH TWICE A DAY BEFORE MEALS  Patient taking differently: Take 40 mg  by mouth 2 (two) times daily.   Olive Bass, FNP Taking Active Self, Spouse/Significant Other  sulfamethoxazole-trimethoprim (BACTRIM DS) 800-160 MG tablet [id number] Yes Take 1 tablet  by mouth every 12 (twelve) hours for 4 days. Burnadette Pop, MD Taking Active             Home Care and Equipment/Supplies: Were Home Health Services Ordered?: NA Any new equipment or medical supplies ordered?: NA  Functional Questionnaire: Do you need assistance with bathing/showering or dressing?: No Do you need assistance with meal preparation?: No Do you need assistance with eating?: No Do you have difficulty maintaining continence: No Do you need assistance with getting out of bed/getting out of a chair/moving?: No Do you have difficulty managing or taking your medications?: No  Follow up appointments reviewed: PCP Follow-up appointment confirmed?: Yes Date of PCP follow-up appointment?: 06/13/22 Follow-up Provider: [facility] Follow-up appointment confirmed?: No Reason Specialist Follow-Up Not Confirmed: Patient has Specialist Provider Number and will Call for Appointment Do you need transportation to your follow-up appointment?: No Do you understand care options if your condition(s) worsen?: Yes-patient verbalized understanding    SIGNATURE Karena Addison, LPN [facility] Nurse Health Advisor Direct Dial [phone]

## 2022-06-13 ENCOUNTER — Ambulatory Visit (INDEPENDENT_AMBULATORY_CARE_PROVIDER_SITE_OTHER): Payer: Medicare (Managed Care) | Admitting: Family

## 2022-06-13 ENCOUNTER — Encounter: Payer: Self-pay | Admitting: Family

## 2022-06-13 VITALS — BP 114/62 | HR 62 | Ht 70.0 in | Wt 183.4 lb

## 2022-06-13 DIAGNOSIS — R109 Unspecified abdominal pain: Secondary | ICD-10-CM | POA: Diagnosis not present

## 2022-06-13 NOTE — Patient Instructions (Signed)
Please call your surgeon to let them know about your persisting symptoms. They will also be the ones to refill your medications.

## 2022-06-13 NOTE — Progress Notes (Signed)
Jeremy Ryan is a 66 y.o. male with the following history as recorded in EpicCare:  Patient Active Problem List   Diagnosis Date Noted   Choledocholithiasis with obstruction 06/04/2022   Ascending cholangitis 06/03/2022   Hyperbilirubinemia 06/03/2022   History of esophageal stricture 06/03/2022   Dysphagia 01/18/2022   Episodic lightheadedness 11/02/2021   Type 2 diabetes mellitus without complication, without long-term current use of insulin (HCC) 08/18/2021   Angina pectoris (HCC) 12/28/2020   DOE (dyspnea on exertion) 12/28/2020   CAD in native artery 02/17/2020   Dyslipidemia 02/17/2020   Prediabetes 02/17/2020   Shingles 02/17/2020   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 07/07/2015   Chest pain 07/07/2015   Plantar fasciitis of right foot 11/25/2014   B12 deficiency 08/22/2013   Erectile dysfunction 08/22/2013   Essential hypertension 03/11/2013   Gastro-esophageal reflux disease without esophagitis 03/11/2013   Paroxysmal atrial fibrillation (HCC) 03/11/2013   Hyperlipidemia 03/11/2013   Benign neoplasm of colon 01/17/2013   Benign localized prostatic hyperplasia without lower urinary tract symptoms (LUTS) 11/29/2012   Skin sensation disturbance 11/29/2012   Restless legs syndrome 08/16/2012   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     aspirin EC 81 MG tablet Take 81 mg by mouth daily. Swallow whole.     atorvastatin (LIPITOR) 80 MG tablet Take 1 tablet by mouth every evening.     BLACK CURRANT SEED OIL PO Take 1 capsule by mouth daily.     busPIRone (BUSPAR) 5 MG tablet Take 5 mg by mouth at bedtime.     busPIRone (BUSPAR) 7.5 MG tablet Take 1 in the am and 1 at  lunchtime as directed (Patient taking differently: Take 7.5 mg by mouth See admin instructions. Take 1 in the am and 1 at lunchtime as directed) 180 tablet 1   Cinnamon 500 MG capsule Take 500 mg by mouth daily.     clopidogrel (PLAVIX) 75 MG tablet Take 75 mg by mouth daily.     dicyclomine (BENTYL) 20 MG tablet Take 1 tablet (20 mg total) by mouth 4 (four) times daily. 120 tablet 0   isosorbide mononitrate (IMDUR) 30 MG 24 hr tablet Take 1 tablet (30 mg total) by mouth daily.     JARDIANCE 25 MG TABS tablet Take 25 mg by mouth daily.     losartan (COZAAR) 25 MG tablet Take 0.5 tablets (12.5 mg total) by mouth daily.     Multiple Vitamins-Minerals (MULTIVITAMIN WITH MINERALS) tablet Take 1 tablet by mouth daily.     oxyCODONE (OXY IR/ROXICODONE) 5 MG immediate release tablet Take 1 tablet (5 mg total) by mouth every 6 (six) hours as needed for moderate pain. 15 tablet 0   pantoprazole (PROTONIX) 40 MG tablet TAKE 1 TABLET (40 MG TOTAL) BY MOUTH TWICE A DAY BEFORE MEALS (Patient taking differently: Take 40 mg by mouth 2 (two) times daily.) 180 tablet 1   No current facility-administered medications for this visit.    Allergies: Patient has no known allergies.  Past Medical History:  Diagnosis Date   Colon polyps    Coronary artery disease    History of blood clots    Hypertension     Past Surgical History:  Procedure Laterality Date   CHOLECYSTECTOMY N/A 06/06/2022   Procedure: LAPAROSCOPIC CHOLECYSTECTOMY;  Surgeon: Fritzi Mandes, MD;  Location: [facility] ORS;  Service: General;  Laterality: N/A;   CORONARY STENT INTERVENTION     ENDOSCOPIC RETROGRADE CHOLANGIOPANCREATOGRAPHY (ERCP) WITH PROPOFOL N/A 06/05/2022   Procedure: ENDOSCOPIC RETROGRADE CHOLANGIOPANCREATOGRAPHY (ERCP) WITH PROPOFOL;  Surgeon: Vida Rigger, MD;  Location: [facility] ENDOSCOPY;  Service: Gastroenterology;  Laterality: N/A;   REMOVAL OF STONES  06/05/2022   Procedure: REMOVAL OF STONES;  Surgeon: Vida Rigger, MD;  Location: [facility];  Service: Gastroenterology;;   Dennison Mascot  06/05/2022   Procedure: Dennison Mascot;  Surgeon: Vida Rigger, MD;  Location: [facility] ENDOSCOPY;  Service: Gastroenterology;;    No family history on file.  Social History   Tobacco Use   Smoking status: Former   Smokeless tobacco: Never  Substance Use Topics   Alcohol use: Never    Subjective:   Patient was admitted to hospital from 06/03/2022- 06/07/2022 with cholangitis/ cholecystitis/ choledocholithiasis; had been doing well until yesterday- ate steak and eggs and started having increased vomiting; notes that symptoms have improved today however; has not reached out to his surgeon to let them know about the symptoms.  Has lost 20 pounds since February 2024;    Objective:  Vitals:   06/13/22 1506  BP: 114/62  Pulse: 62  SpO2: 98%  Weight: 183 lb 6.4 oz (83.2 kg)  Height: 5\' 10"  (1.778 m)    General: Well developed, well nourished, in no acute distress  Skin : Warm and dry. 4 healing lesions on abdomen;  Head: Normocephalic and atraumatic  Eyes: Sclera and conjunctiva clear; pupils round and reactive to light; extraocular movements intact; questionable discoloration noted of conjunctiva- history of jaundice with hospitalization;  Ears: External normal; canals clear; tympanic membranes normal  Oropharynx: Pink, supple. No suspicious lesions  Neck: Supple without thyromegaly, adenopathy  Lungs: Respirations unlabored; clear to auscultation bilaterally without wheeze, rales, rhonchi  CVS exam: normal rate and regular rhythm.  Abdomen: Soft; nontender; nondistended; normoactive bowel sounds; no masses or hepatosplenomegaly  Neurologic: Alert and oriented; speech intact; face symmetrical; moves all extremities well; CNII-XII intact without focal deficit   Assessment:  1. Abdominal pain, unspecified abdominal location     Plan:  Patient's skin color and eye color are concerning today but am unsure if this is due healing process;  per patient, he was "very yellow" when he was in the hospital; will update labs today including CBC, CMP, amylase and lipase. I have asked him repeatedly to call his surgeon today and ask to be seen in the next 24-48 hours- he agrees; follow up to be determined;  Time spent 30 minutes reviewing notes/ discussing treatment options  No follow-ups on file.  Orders Placed This Encounter  Procedures   CBC with Differential/Platelet   Comp Met (CMET)   Amylase   Lipase    Requested Prescriptions    No prescriptions requested or ordered in this encounter

## 2022-06-14 ENCOUNTER — Encounter (HOSPITAL_BASED_OUTPATIENT_CLINIC_OR_DEPARTMENT_OTHER): Payer: Self-pay | Admitting: Emergency Medicine

## 2022-06-14 ENCOUNTER — Telehealth: Payer: Self-pay

## 2022-06-14 ENCOUNTER — Other Ambulatory Visit: Payer: Self-pay

## 2022-06-14 ENCOUNTER — Encounter: Payer: Self-pay | Admitting: Surgery

## 2022-06-14 ENCOUNTER — Inpatient Hospital Stay (HOSPITAL_BASED_OUTPATIENT_CLINIC_OR_DEPARTMENT_OTHER)
Admission: EM | Admit: 2022-06-14 | Discharge: 2022-06-16 | DRG: 394 | Disposition: A | Discharge status: Home / Self Care | Payer: Medicare (Managed Care) | Attending: Internal Medicine | Admitting: Internal Medicine

## 2022-06-14 ENCOUNTER — Emergency Department (HOSPITAL_BASED_OUTPATIENT_CLINIC_OR_DEPARTMENT_OTHER): Payer: Medicare (Managed Care)

## 2022-06-14 DIAGNOSIS — R7401 Elevation of levels of liver transaminase levels: Secondary | ICD-10-CM | POA: Diagnosis present

## 2022-06-14 DIAGNOSIS — E871 Hypo-osmolality and hyponatremia: Secondary | ICD-10-CM | POA: Diagnosis present

## 2022-06-14 DIAGNOSIS — Z7982 Long term (current) use of aspirin: Secondary | ICD-10-CM

## 2022-06-14 DIAGNOSIS — R188 Other ascites: Secondary | ICD-10-CM | POA: Diagnosis not present

## 2022-06-14 DIAGNOSIS — I2510 Atherosclerotic heart disease of native coronary artery without angina pectoris: Secondary | ICD-10-CM | POA: Diagnosis present

## 2022-06-14 DIAGNOSIS — Z7984 Long term (current) use of oral hypoglycemic drugs: Secondary | ICD-10-CM

## 2022-06-14 DIAGNOSIS — E785 Hyperlipidemia, unspecified: Secondary | ICD-10-CM | POA: Diagnosis present

## 2022-06-14 DIAGNOSIS — Z955 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: Secondary | ICD-10-CM

## 2022-06-14 DIAGNOSIS — Z86718 Personal history of other venous thrombosis and embolism: Secondary | ICD-10-CM

## 2022-06-14 DIAGNOSIS — K9189 Other postprocedural complications and disorders of digestive system: Principal | ICD-10-CM | POA: Diagnosis present

## 2022-06-14 DIAGNOSIS — G2581 Restless legs syndrome: Secondary | ICD-10-CM | POA: Diagnosis present

## 2022-06-14 DIAGNOSIS — Z87891 Personal history of nicotine dependence: Secondary | ICD-10-CM

## 2022-06-14 DIAGNOSIS — E119 Type 2 diabetes mellitus without complications: Secondary | ICD-10-CM

## 2022-06-14 DIAGNOSIS — Z79899 Other long term (current) drug therapy: Secondary | ICD-10-CM

## 2022-06-14 DIAGNOSIS — Z8719 Personal history of other diseases of the digestive system: Secondary | ICD-10-CM

## 2022-06-14 DIAGNOSIS — Y838 Other surgical procedures as the cause of abnormal reaction of the patient, or of later complication, without mention of misadventure at the time of the procedure: Secondary | ICD-10-CM | POA: Diagnosis present

## 2022-06-14 DIAGNOSIS — I480 Paroxysmal atrial fibrillation: Secondary | ICD-10-CM | POA: Diagnosis present

## 2022-06-14 DIAGNOSIS — Z7902 Long term (current) use of antithrombotics/antiplatelets: Secondary | ICD-10-CM

## 2022-06-14 DIAGNOSIS — I10 Essential (primary) hypertension: Secondary | ICD-10-CM | POA: Diagnosis present

## 2022-06-14 DIAGNOSIS — R112 Nausea with vomiting, unspecified: Secondary | ICD-10-CM | POA: Diagnosis present

## 2022-06-14 DIAGNOSIS — K668 Other specified disorders of peritoneum: Principal | ICD-10-CM

## 2022-06-14 DIAGNOSIS — R110 Nausea: Secondary | ICD-10-CM

## 2022-06-14 DIAGNOSIS — R7989 Other specified abnormal findings of blood chemistry: Secondary | ICD-10-CM

## 2022-06-14 LAB — LIPASE, BLOOD: Lipase: 46 U/L (ref 11–51)

## 2022-06-14 LAB — COMPREHENSIVE METABOLIC PANEL
ALT: 235 U/L — ABNORMAL HIGH (ref 0–53)
ALT: 159 U/L — ABNORMAL HIGH (ref 0–44)
AST: 179 U/L — ABNORMAL HIGH (ref 0–37)
AST: 89 U/L — ABNORMAL HIGH (ref 15–41)
Albumin: 3.9 g/dL (ref 3.5–5.2)
Albumin: 3.7 g/dL (ref 3.5–5.0)
Alkaline Phosphatase: 623 U/L — ABNORMAL HIGH (ref 39–117)
Alkaline Phosphatase: 488 U/L — ABNORMAL HIGH (ref 38–126)
Anion gap: 9 (ref 5–15)
BUN: 24 mg/dL — ABNORMAL HIGH (ref 6–23)
BUN: 26 mg/dL — ABNORMAL HIGH (ref 8–23)
CO2: 28 mEq/L (ref 19–32)
CO2: 27 mmol/L (ref 22–32)
Calcium: 9.4 mg/dL (ref 8.4–10.5)
Calcium: 9.4 mg/dL (ref 8.9–10.3)
Chloride: 100 mEq/L (ref 96–112)
Chloride: 96 mmol/L — ABNORMAL LOW (ref 98–111)
Creatinine, Ser: 0.88 mg/dL (ref 0.40–1.50)
Creatinine, Ser: 0.97 mg/dL (ref 0.61–1.24)
GFR, Estimated: >60 mL/min (ref >60)
GFR: 88.1 mL/min (ref >60.00)
Glucose, Bld: 110 mg/dL — ABNORMAL HIGH (ref 70–99)
Glucose, Bld: 109 mg/dL — ABNORMAL HIGH (ref 70–99)
Potassium: 4.9 mEq/L (ref 3.5–5.1)
Potassium: 5 mmol/L (ref 3.5–5.1)
Sodium: 137 mEq/L (ref 135–145)
Sodium: 132 mmol/L — ABNORMAL LOW (ref 135–145)
Total Bilirubin: 1.3 mg/dL — ABNORMAL HIGH (ref 0.2–1.2)
Total Bilirubin: 1.2 mg/dL (ref 0.3–1.2)
Total Protein: 7.1 g/dL (ref 6.0–8.3)
Total Protein: 8.2 g/dL — ABNORMAL HIGH (ref 6.5–8.1)

## 2022-06-14 LAB — CBC WITH DIFFERENTIAL/PLATELET
Abs Immature Granulocytes: 0.07 10*3/uL (ref 0.00–0.07)
Basophils Absolute: 0.1 10*3/uL (ref 0.0–0.1)
Basophils Absolute: 0.1 10*3/uL (ref 0.0–0.1)
Basophils Relative: 1.1 % (ref 0.0–3.0)
Basophils Relative: 1 %
Eosinophils Absolute: 0.3 10*3/uL (ref 0.0–0.7)
Eosinophils Absolute: 0.2 10*3/uL (ref 0.0–0.5)
Eosinophils Relative: 3 % (ref 0.0–5.0)
Eosinophils Relative: 2 %
HCT: 40.4 % (ref 39.0–52.0)
HCT: 41.8 % (ref 39.0–52.0)
Hemoglobin: 13.8 g/dL (ref 13.0–17.0)
Hemoglobin: 14 g/dL (ref 13.0–17.0)
Immature Granulocytes: 1 %
Lymphocytes Relative: 21.4 % (ref 12.0–46.0)
Lymphocytes Relative: 23 %
Lymphs Abs: 2 10*3/uL (ref 0.7–4.0)
Lymphs Abs: 2.1 10*3/uL (ref 0.7–4.0)
MCH: 27.9 pg (ref 26.0–34.0)
MCHC: 34.2 g/dL (ref 30.0–36.0)
MCHC: 33.5 g/dL (ref 30.0–36.0)
MCV: 83.9 fl (ref 78.0–100.0)
MCV: 83.4 fL (ref 80.0–100.0)
Monocytes Absolute: 0.8 10*3/uL (ref 0.1–1.0)
Monocytes Absolute: 0.6 10*3/uL (ref 0.1–1.0)
Monocytes Relative: 8.1 % (ref 3.0–12.0)
Monocytes Relative: 7 %
Neutro Abs: 6.3 10*3/uL (ref 1.4–7.7)
Neutro Abs: 6.2 10*3/uL (ref 1.7–7.7)
Neutrophils Relative %: 66.4 % (ref 43.0–77.0)
Neutrophils Relative %: 66 %
Platelets: 323 10*3/uL (ref 150.0–400.0)
Platelets: 326 10*3/uL (ref 150–400)
RBC: 4.82 Mil/uL (ref 4.22–5.81)
RBC: 5.01 MIL/uL (ref 4.22–5.81)
RDW: 14.5 % (ref 11.5–15.5)
RDW: 14 % (ref 11.5–15.5)
WBC: 9.5 10*3/uL (ref 4.0–10.5)
WBC: 9.3 10*3/uL (ref 4.0–10.5)
nRBC: 0 % (ref 0.0–0.2)

## 2022-06-14 LAB — AMYLASE: Amylase: 57 U/L (ref 27–131)

## 2022-06-14 LAB — URINALYSIS, W/ REFLEX TO CULTURE (INFECTION SUSPECTED)
Bacteria, UA: NONE SEEN
Bilirubin Urine: NEGATIVE
Glucose, UA: >=500 mg/dL — AB
Hgb urine dipstick: NEGATIVE
Ketones, ur: NEGATIVE mg/dL
Leukocytes,Ua: NEGATIVE
Nitrite: NEGATIVE
Protein, ur: NEGATIVE mg/dL
Specific Gravity, Urine: 1.02 (ref 1.005–1.030)
pH: 6 (ref 5.0–8.0)

## 2022-06-14 LAB — LIPASE: Lipase: 56 U/L (ref 11.0–59.0)

## 2022-06-14 MED ORDER — PIPERACILLIN-TAZOBACTAM 3.375 G IVPB
3.3750 g | Freq: Three times a day (TID) | INTRAVENOUS | Status: DC
  Administered 2022-06-15 – 2022-06-16 (×4): 3.375 g via INTRAVENOUS
  Filled 2022-06-14 (×4): qty 50

## 2022-06-14 MED ORDER — IOHEXOL 300 MG/ML  SOLN
100.0000 mL | Freq: Once | INTRAMUSCULAR | Status: AC | PRN
  Administered 2022-06-14: 100 mL via INTRAVENOUS

## 2022-06-14 MED ORDER — PIPERACILLIN-TAZOBACTAM 3.375 G IVPB 30 MIN
3.3750 g | Freq: Once | INTRAVENOUS | Status: AC
  Administered 2022-06-14: 3.375 g via INTRAVENOUS
  Filled 2022-06-14: qty 50

## 2022-06-14 NOTE — Telephone Encounter (Signed)
-----   Message from Olive Bass, FNP sent at 06/14/2022  3:18 PM EDT ----- His liver functions are very concerning- based on the labs and his appearance yesterday, he needs to go back to the ER.

## 2022-06-14 NOTE — Telephone Encounter (Signed)
Spoke with pt, pt is aware of results and expressed understanding. Pt sated he will "head to the ER now".

## 2022-06-14 NOTE — ED Triage Notes (Signed)
Pt seen at PCP yesterday for blood work.  Pt called today and was told to come to ED for further testing.

## 2022-06-14 NOTE — ED Notes (Signed)
Pt. Is here due to abnormal labs and is going to have a scan done.  Pt had Gallbladder out on Last Monday.  Pt. Reports he has been nauseated after eating his meals.

## 2022-06-14 NOTE — Progress Notes (Signed)
Pharmacy Antibiotic Note  Jeremy Ryan is a 67 y.o. male admitted on 06/14/2022 with intra-abdominal infection.  Pharmacy has been consulted for Zosyn dosing.  He is afebrile with a normal white blood cell count at 9.3k. He had his gallbladder removed Monday, May 15th.   Plan: Start Zosyn 3.375 g q8 hours  Monitor signs and symptoms of infection, culture results, renal function, and ability to narrow      Temp (24hrs), Avg:98 F (36.7 C), Min:98 F (36.7 C), Max:98 F (36.7 C)  Recent Labs  Lab 06/13/22 1553 06/14/22 1812  WBC 9.5 9.3  CREATININE 0.88 0.97    Estimated Creatinine Clearance: 75.3 mL/min (by C-G formula based on SCr of 0.97 mg/dL).    No Known Allergies   Microbiology results:  BCx:   UCx:    Sputum:    MRSA PCR:    Thank you for allowing pharmacy to be a part of this patient's care.  Blane Ohara, PharmD  PGY1 Pharmacy Resident

## 2022-06-14 NOTE — ED Provider Notes (Signed)
[place] [facility] Provider Note   CSN: [id number] Arrival date & time: 06/14/22  1752     History {Add pertinent medical, surgical, social history, OB history to HPI:1} Chief Complaint  Patient presents with   Abnormal Lab    DELANCE VANDERZWAAG is a 67 y.o. male.  HPI    67 year old male with a history of hypertension, coronary artery disease, type 2 diabetes, paroxysmal atrial fibrillation, recent admission May 4 to May 8 with finding of choledocholithiasis, cholecystitis, status post ERCP and laparoscopic cholecystectomy with hospital course remarkable for E. coli bacteremia secondary to ascending cholangitis, who presents with concern for increasing AST, ALT on labs.  On the eighth, his AST was 64, ALT 99, and yesterday with his primary care doctor his AST had increased to 179, and ALT of 235.  Has had nausea for the past week since being in the hospital after eating. Monday, had vomiting after eating steak. Yesterday, was feeling better.  Not having any abdominal pain now, no abdominal pain with eating. Has had nausea with eating but not today or yesterday. Monday after steak and eggs.  Taking miralax trying to have a bowel movement, did have a small amount, just one since coming home.  Have not been taking pain medication since Monday, ran out but has not needed.  Also passing flatus.   Urinating frequently, getting up 4-5 times per night   Past Medical History:  Diagnosis Date   Colon polyps    Coronary artery disease    History of blood clots    Hypertension      Home Medications Prior to Admission medications   Medication Sig Start Date End Date Taking? Authorizing Provider  acetaminophen (TYLENOL) 325 MG tablet Take 2-3 tablets (650-975 mg total) by mouth every 6 (six) hours as needed. 06/07/22   Adam Phenix, PA-C  aspirin EC 81 MG tablet Take 81 mg by mouth daily. Swallow whole.    [provider]  atorvastatin  (LIPITOR) 80 MG tablet Take 1 tablet by mouth every evening. 01/12/21   [provider]  BLACK CURRANT SEED OIL PO Take 1 capsule by mouth daily.    [provider]  busPIRone (BUSPAR) 5 MG tablet Take 5 mg by mouth at bedtime. 06/10/21   [provider]  busPIRone (BUSPAR) 7.5 MG tablet Take 1 in the am and 1 at lunchtime as directed Patient taking differently: Take 7.5 mg by mouth See admin instructions. Take 1 in the am and 1 at lunchtime as directed 04/06/22   Olive Bass, FNP  Cinnamon 500 MG capsule Take 500 mg by mouth daily.    [provider]  clopidogrel (PLAVIX) 75 MG tablet Take 75 mg by mouth daily. 06/23/21   [provider]  dicyclomine (BENTYL) 20 MG tablet Take 1 tablet (20 mg total) by mouth 4 (four) times daily. 05/08/22   Olive Bass, FNP  isosorbide mononitrate (IMDUR) 30 MG 24 hr tablet Take 1 tablet (30 mg total) by mouth daily. 06/07/22   Burnadette Pop, MD  JARDIANCE 25 MG TABS tablet Take 25 mg by mouth daily. 10/04/21   [provider]  losartan (COZAAR) 25 MG tablet Take 0.5 tablets (12.5 mg total) by mouth daily. 06/07/22   Burnadette Pop, MD  Multiple Vitamins-Minerals (MULTIVITAMIN WITH MINERALS) tablet Take 1 tablet by mouth daily.    [provider]  oxyCODONE (OXY IR/ROXICODONE) 5 MG immediate release tablet Take 1 tablet (  5 mg total) by mouth every 6 (six) hours as needed for moderate pain. 06/07/22   Adam Phenix, PA-C  pantoprazole (PROTONIX) 40 MG tablet TAKE 1 TABLET (40 MG TOTAL) BY MOUTH TWICE A DAY BEFORE MEALS Patient taking differently: Take 40 mg by mouth 2 (two) times daily. 04/12/22   Olive Bass, FNP      Allergies    Patient has no known allergies.    Review of Systems   Review of Systems  Physical Exam Updated Vital Signs BP 112/67 (BP Location: Left Arm)   Pulse (!) 57   Temp 98 F (36.7 C)   Resp 20   SpO2 99%  Physical Exam  ED Results /  Procedures / Treatments   Labs (all labs ordered are listed, but only abnormal results are displayed) Labs Reviewed  CBC WITH DIFFERENTIAL/PLATELET  COMPREHENSIVE METABOLIC PANEL  LIPASE, BLOOD  URINALYSIS, W/ REFLEX TO CULTURE (INFECTION SUSPECTED)    EKG None  Radiology No results found.  Procedures Procedures  {Document cardiac monitor, telemetry assessment procedure when appropriate:1}  Medications Ordered in ED Medications - No data to display  ED Course/ Medical Decision Making/ A&P   {   Click here for ABCD2, HEART and other calculatorsREFRESH Note before signing :1}                          Medical Decision Making Amount and/or Complexity of Data Reviewed Labs: ordered.   ***    Labs completed and personally about interpreted by me show no leukocytosis, no anemia.  CMP shows normal creatinine, mild hyponatremia, AST and ALT decreased from labs yesterday--with AST yesterday of 179, down to 89 today-was 64 at time of discharge on May 8.  ALT is 159, down from 235 yesterday, with discharge lab of 99, alk phos of 488, decreased from 623, with discharge alk phos of 318.  Urinalysis with glucose, without signs of infection.  {Document critical care time when appropriate:1} {Document review of labs and clinical decision tools ie heart score, Chads2Vasc2 etc:1}  {Document your independent review of radiology images, and any outside records:1} {Document your discussion with family members, caretakers, and with consultants:1} {Document social determinants of health affecting pt's care:1} {Document your decision making why or why not admission, treatments were needed:1} Final Clinical Impression(s) / ED Diagnoses Final diagnoses:  None    Rx / DC Orders ED Discharge Orders     None

## 2022-06-15 ENCOUNTER — Inpatient Hospital Stay (HOSPITAL_COMMUNITY): Payer: Medicare (Managed Care)

## 2022-06-15 DIAGNOSIS — I2510 Atherosclerotic heart disease of native coronary artery without angina pectoris: Secondary | ICD-10-CM

## 2022-06-15 DIAGNOSIS — R188 Other ascites: Secondary | ICD-10-CM | POA: Diagnosis present

## 2022-06-15 DIAGNOSIS — E871 Hypo-osmolality and hyponatremia: Secondary | ICD-10-CM | POA: Diagnosis present

## 2022-06-15 DIAGNOSIS — R110 Nausea: Secondary | ICD-10-CM | POA: Diagnosis not present

## 2022-06-15 DIAGNOSIS — Z955 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: Secondary | ICD-10-CM | POA: Diagnosis not present

## 2022-06-15 DIAGNOSIS — Z7902 Long term (current) use of antithrombotics/antiplatelets: Secondary | ICD-10-CM | POA: Diagnosis not present

## 2022-06-15 DIAGNOSIS — Z86718 Personal history of other venous thrombosis and embolism: Secondary | ICD-10-CM | POA: Diagnosis not present

## 2022-06-15 DIAGNOSIS — T8189XA Other complications of procedures, not elsewhere classified, initial encounter: Secondary | ICD-10-CM | POA: Diagnosis not present

## 2022-06-15 DIAGNOSIS — Z7982 Long term (current) use of aspirin: Secondary | ICD-10-CM | POA: Diagnosis not present

## 2022-06-15 DIAGNOSIS — Y838 Other surgical procedures as the cause of abnormal reaction of the patient, or of later complication, without mention of misadventure at the time of the procedure: Secondary | ICD-10-CM | POA: Diagnosis present

## 2022-06-15 DIAGNOSIS — K668 Other specified disorders of peritoneum: Secondary | ICD-10-CM

## 2022-06-15 DIAGNOSIS — R112 Nausea with vomiting, unspecified: Secondary | ICD-10-CM | POA: Diagnosis present

## 2022-06-15 DIAGNOSIS — I10 Essential (primary) hypertension: Secondary | ICD-10-CM

## 2022-06-15 DIAGNOSIS — I480 Paroxysmal atrial fibrillation: Secondary | ICD-10-CM

## 2022-06-15 DIAGNOSIS — R7401 Elevation of levels of liver transaminase levels: Secondary | ICD-10-CM | POA: Diagnosis present

## 2022-06-15 DIAGNOSIS — R7989 Other specified abnormal findings of blood chemistry: Secondary | ICD-10-CM | POA: Diagnosis not present

## 2022-06-15 DIAGNOSIS — E785 Hyperlipidemia, unspecified: Secondary | ICD-10-CM | POA: Diagnosis present

## 2022-06-15 DIAGNOSIS — Z87891 Personal history of nicotine dependence: Secondary | ICD-10-CM | POA: Diagnosis not present

## 2022-06-15 DIAGNOSIS — K9189 Other postprocedural complications and disorders of digestive system: Secondary | ICD-10-CM | POA: Diagnosis present

## 2022-06-15 DIAGNOSIS — G2581 Restless legs syndrome: Secondary | ICD-10-CM | POA: Diagnosis present

## 2022-06-15 DIAGNOSIS — Z8719 Personal history of other diseases of the digestive system: Secondary | ICD-10-CM | POA: Diagnosis not present

## 2022-06-15 DIAGNOSIS — E119 Type 2 diabetes mellitus without complications: Secondary | ICD-10-CM

## 2022-06-15 DIAGNOSIS — Z7984 Long term (current) use of oral hypoglycemic drugs: Secondary | ICD-10-CM | POA: Diagnosis not present

## 2022-06-15 DIAGNOSIS — Z79899 Other long term (current) drug therapy: Secondary | ICD-10-CM | POA: Diagnosis not present

## 2022-06-15 LAB — MAGNESIUM: Magnesium: 2.2 mg/dL (ref 1.7–2.4)

## 2022-06-15 LAB — CBC WITH DIFFERENTIAL/PLATELET
Abs Immature Granulocytes: 0.04 10*3/uL (ref 0.00–0.07)
Basophils Absolute: 0.1 10*3/uL (ref 0.0–0.1)
Basophils Relative: 1 %
Eosinophils Absolute: 0.2 10*3/uL (ref 0.0–0.5)
Eosinophils Relative: 3 %
HCT: 41 % (ref 39.0–52.0)
Hemoglobin: 13.4 g/dL (ref 13.0–17.0)
Immature Granulocytes: 1 %
Lymphocytes Relative: 24 %
Lymphs Abs: 1.9 10*3/uL (ref 0.7–4.0)
MCH: 27.9 pg (ref 26.0–34.0)
MCHC: 32.7 g/dL (ref 30.0–36.0)
MCV: 85.2 fL (ref 80.0–100.0)
Monocytes Absolute: 0.6 10*3/uL (ref 0.1–1.0)
Monocytes Relative: 8 %
Neutro Abs: 5 10*3/uL (ref 1.7–7.7)
Neutrophils Relative %: 63 %
Platelets: 245 10*3/uL (ref 150–400)
RBC: 4.81 MIL/uL (ref 4.22–5.81)
RDW: 13.8 % (ref 11.5–15.5)
WBC: 7.8 10*3/uL (ref 4.0–10.5)
nRBC: 0 % (ref 0.0–0.2)

## 2022-06-15 LAB — COMPREHENSIVE METABOLIC PANEL
ALT: 125 U/L — ABNORMAL HIGH (ref 0–44)
AST: 65 U/L — ABNORMAL HIGH (ref 15–41)
Albumin: 3.3 g/dL — ABNORMAL LOW (ref 3.5–5.0)
Alkaline Phosphatase: 392 U/L — ABNORMAL HIGH (ref 38–126)
Anion gap: 8 (ref 5–15)
BUN: 21 mg/dL (ref 8–23)
CO2: 27 mmol/L (ref 22–32)
Calcium: 9.2 mg/dL (ref 8.9–10.3)
Chloride: 98 mmol/L (ref 98–111)
Creatinine, Ser: 0.98 mg/dL (ref 0.61–1.24)
GFR, Estimated: >60 mL/min (ref >60)
Glucose, Bld: 113 mg/dL — ABNORMAL HIGH (ref 70–99)
Potassium: 4.2 mmol/L (ref 3.5–5.1)
Sodium: 133 mmol/L — ABNORMAL LOW (ref 135–145)
Total Bilirubin: 1.2 mg/dL (ref 0.3–1.2)
Total Protein: 7.4 g/dL (ref 6.5–8.1)

## 2022-06-15 LAB — CBC
HCT: 40.4 % (ref 39.0–52.0)
Hemoglobin: 13.4 g/dL (ref 13.0–17.0)
MCH: 28 pg (ref 26.0–34.0)
MCHC: 33.2 g/dL (ref 30.0–36.0)
MCV: 84.5 fL (ref 80.0–100.0)
Platelets: 268 10*3/uL (ref 150–400)
RBC: 4.78 MIL/uL (ref 4.22–5.81)
RDW: 13.8 % (ref 11.5–15.5)
WBC: 7.5 10*3/uL (ref 4.0–10.5)
nRBC: 0 % (ref 0.0–0.2)

## 2022-06-15 LAB — PROTIME-INR
INR: 1.1 (ref 0.8–1.2)
Prothrombin Time: 14 seconds (ref 11.4–15.2)

## 2022-06-15 LAB — CREATININE, SERUM
Creatinine, Ser: 0.97 mg/dL (ref 0.61–1.24)
GFR, Estimated: >60 mL/min (ref >60)

## 2022-06-15 LAB — GLUCOSE, CAPILLARY
Glucose-Capillary: 114 mg/dL — ABNORMAL HIGH (ref 70–99)
Glucose-Capillary: 146 mg/dL — ABNORMAL HIGH (ref 70–99)
Glucose-Capillary: 97 mg/dL (ref 70–99)

## 2022-06-15 MED ORDER — BUSPIRONE HCL 5 MG PO TABS
7.5000 mg | ORAL_TABLET | Freq: Two times a day (BID) | ORAL | Status: DC
  Administered 2022-06-15 – 2022-06-16 (×4): 7.5 mg via ORAL
  Filled 2022-06-15 (×4): qty 2

## 2022-06-15 MED ORDER — ENOXAPARIN SODIUM 40 MG/0.4ML IJ SOSY
40.0000 mg | PREFILLED_SYRINGE | INTRAMUSCULAR | Status: DC
  Administered 2022-06-15: 40 mg via SUBCUTANEOUS
  Filled 2022-06-15: qty 0.4

## 2022-06-15 MED ORDER — MELATONIN 5 MG PO TABS
5.0000 mg | ORAL_TABLET | Freq: Every evening | ORAL | Status: DC | PRN
  Administered 2022-06-15 (×2): 5 mg via ORAL
  Filled 2022-06-15 (×2): qty 1

## 2022-06-15 MED ORDER — INSULIN ASPART 100 UNIT/ML IJ SOLN
0.0000 [IU] | INTRAMUSCULAR | Status: DC
  Administered 2022-06-16: 1 [IU] via SUBCUTANEOUS

## 2022-06-15 MED ORDER — TECHNETIUM TC 99M MEBROFENIN IV KIT
5.2500 | PACK | Freq: Once | INTRAVENOUS | Status: AC | PRN
  Administered 2022-06-15: 5.25 via INTRAVENOUS

## 2022-06-15 MED ORDER — HYDROMORPHONE HCL 1 MG/ML IJ SOLN
0.5000 mg | INTRAMUSCULAR | Status: DC | PRN
  Administered 2022-06-15 (×2): 0.5 mg via INTRAVENOUS
  Filled 2022-06-15 (×2): qty 0.5

## 2022-06-15 MED ORDER — LOSARTAN POTASSIUM 25 MG PO TABS
12.5000 mg | ORAL_TABLET | Freq: Every day | ORAL | Status: DC
  Administered 2022-06-15 – 2022-06-16 (×2): 12.5 mg via ORAL
  Filled 2022-06-15 (×2): qty 1

## 2022-06-15 MED ORDER — PANTOPRAZOLE SODIUM 40 MG IV SOLR
40.0000 mg | Freq: Two times a day (BID) | INTRAVENOUS | Status: DC
  Administered 2022-06-15 (×3): 40 mg via INTRAVENOUS
  Filled 2022-06-15 (×3): qty 10

## 2022-06-15 MED ORDER — SODIUM CHLORIDE 0.9 % IV SOLN: INTRAVENOUS | Status: DC

## 2022-06-15 MED ORDER — ONDANSETRON HCL 4 MG/2ML IJ SOLN: 4.0000 mg | Freq: Four times a day (QID) | INTRAMUSCULAR | Status: DC | PRN

## 2022-06-15 MED ORDER — BUSPIRONE HCL 5 MG PO TABS
5.0000 mg | ORAL_TABLET | Freq: Every day | ORAL | Status: DC
  Administered 2022-06-15 (×2): 5 mg via ORAL
  Filled 2022-06-15 (×3): qty 1

## 2022-06-15 MED ORDER — SENNOSIDES-DOCUSATE SODIUM 8.6-50 MG PO TABS: 1.0000 | ORAL_TABLET | Freq: Every evening | ORAL | Status: DC | PRN

## 2022-06-15 MED ORDER — ISOSORBIDE MONONITRATE ER 30 MG PO TB24
30.0000 mg | ORAL_TABLET | Freq: Every day | ORAL | Status: DC
  Administered 2022-06-15 – 2022-06-16 (×2): 30 mg via ORAL
  Filled 2022-06-15 (×2): qty 1

## 2022-06-15 NOTE — Hospital Course (Signed)
67 y.o. male with medical history significant of CAD, DM type 2, paroxysmal A-fib, HLD, HTN, and RLS. Patient recently admitted 5/4 - 5/8 w/ cholangitis/ cholecystitis/ choledocholithiasis. He also had E. coli bacteremia secondary to his ascending cholangitis.  S/p ercp w/ sphincterectomy 5/6. No stones seen, lap chole done 5/7.    Pt later noted increased abd discomfort and n/v. CT in ED demonstrated fluid collection in the GB fossa, suggesting abscess vs biloma. General Surgery and GI consulted

## 2022-06-15 NOTE — Progress Notes (Signed)
  Progress Note   Patient: Jeremy Ryan ZOX:[id number] DOB: [birth year] DOA: 06/14/2022     0 DOS: the patient was seen and examined on 06/15/2022   Brief hospital course: 67 y.o. male with medical history significant of CAD, DM type 2, paroxysmal A-fib, HLD, HTN, and RLS. Patient recently admitted 5/4 - 5/8 w/ cholangitis/ cholecystitis/ choledocholithiasis. He also had E. coli bacteremia secondary to his ascending cholangitis.  S/p ercp w/ sphincterectomy 5/6. No stones seen, lap chole done 5/7.    Pt later noted increased abd discomfort and n/v. CT in ED demonstrated fluid collection in the GB fossa, suggesting abscess vs biloma. General Surgery and GI consulted  Assessment and Plan:  Intraabdominal fluid collection/elevated LFTs -Pt underwent HIDA 5/16, reviewed. No evidence of bile leak and patent biliary ducts -Pt is continued on IV Zosyn  -Per General Surgery, collection on CT is likely more related to post-op fluid as opposed to abscess given normal WBC and lack of fevers -LFT's trending down -Pt was evaluated by GI. No further recs noted. GI had since signed off -Recheck cmp in AM    CAD in native artery -Aspirin, Plavix, atorvastatin presently on hold per Surgery recs -Imdur and Cozaar resumed    Essential hypertension -Imdur and Cozaar ordered -As needed blood pressure medications ordered -BP stable    Hyperlipidemia -Statin on hold    Paroxysmal atrial fibrillation (HCC) -Patient bradycardic.  Had been on propranolol which was discontinued last admission. -Previous Plavix and aspirin on hold.  -Not on anticoag PTA   Subjective: Complains of some RUQ discomfort. No other concerns at present  Physical Exam: Vitals:   06/15/22 0032 06/15/22 0642 06/15/22 0815 06/15/22 1500  BP:  125/63 103/70 109/70  Pulse:  61 61 63  Resp:  20 19 20   Temp:  98.1 F (36.7 C) 97.9 F (36.6 C) 97.8 F (36.6 C)  TempSrc:  Oral Oral Oral  SpO2:  97% 96%   Weight: 82.8 kg      Height: 5\' 10"  (1.778 m)      General exam: Awake, laying in bed, in nad Respiratory system: Normal respiratory effort, no wheezing Cardiovascular system: regular rate, s1, s2 Gastrointestinal system: Soft, nondistended, positive BS, Tenderness over RUQ Central nervous system: CN2-12 grossly intact, strength intact Extremities: Perfused, no clubbing Skin: Normal skin turgor, no notable skin lesions seen, post-op wounds healing over abd Psychiatry: Mood normal // no visual hallucinations   Data Reviewed:  Labs reviewed: Na 133, K 4.2, Cr 0.98, Hgb 13.4  Family Communication: Pt in room, family at bedside  Disposition: Status is: Inpatient Remains inpatient appropriate because: Severity of illness  Planned Discharge Destination: Home    Author: Rickey Barbara, MD 06/15/2022 3:46 PM  For on call review [email or web address].

## 2022-06-15 NOTE — Progress Notes (Signed)
No evidence of bile leak.  Collection noted on CT scan is likely more related to post op fluid as opposed to abscess given normal WBC and AF.  May have a diet.  Letha Cape 2:09 PM 06/15/2022

## 2022-06-15 NOTE — Consult Note (Signed)
[facility] Consultation Note  Referring Provider:  [facility] Primary Care Physician:  Olive Bass, FNP  Reason for Consultation:  abnormal CT  HPI: Jeremy Ryan is a 67 y.o. male with abdominal pain (now resolved) after recent ercp and cholecystectomy.  CT possible biloma versus abscess; HIDA negative; felt most likely post-operative fluid collection.  He has no further abdominal pain.  Past Medical History:  Diagnosis Date   Colon polyps    Coronary artery disease    History of blood clots    Hypertension     Past Surgical History:  Procedure Laterality Date   CHOLECYSTECTOMY N/A 06/06/2022   Procedure: LAPAROSCOPIC CHOLECYSTECTOMY;  Surgeon: Fritzi Mandes, MD;  Location: [facility];  Service: General;  Laterality: N/A;   CORONARY STENT INTERVENTION     ENDOSCOPIC RETROGRADE CHOLANGIOPANCREATOGRAPHY (ERCP) WITH PROPOFOL N/A 06/05/2022   Procedure: ENDOSCOPIC RETROGRADE CHOLANGIOPANCREATOGRAPHY (ERCP) WITH PROPOFOL;  Surgeon: Vida Rigger, MD;  Location: [facility];  Service: Gastroenterology;  Laterality: N/A;   REMOVAL OF STONES  06/05/2022   Procedure: REMOVAL OF STONES;  Surgeon: Vida Rigger, MD;  Location: [facility];  Service: Gastroenterology;;   Dennison Mascot  06/05/2022   Procedure: Dennison Mascot;  Surgeon: Vida Rigger, MD;  Location: [facility];  Service: Gastroenterology;;    Prior to Admission medications   Medication Sig Start Date End Date Taking? Authorizing Provider  acetaminophen (TYLENOL) 325 MG tablet Take 2-3 tablets (650-975 mg total) by mouth every 6 (six) hours as needed. 06/07/22  Yes Adam Phenix, PA-C  aspirin EC 81 MG tablet Take 81 mg by mouth daily. Swallow whole.   Yes [provider]  atorvastatin (LIPITOR) 80 MG tablet Take 1 tablet by mouth every evening. 01/12/21  Yes [provider]  BLACK CURRANT SEED OIL PO Take 1 capsule by mouth daily.   Yes [provider]  busPIRone (BUSPAR) 5 MG  tablet Take 5 mg by mouth at bedtime. 06/10/21  Yes [provider]  busPIRone (BUSPAR) 7.5 MG tablet Take 1 in the am and 1 at lunchtime as directed Patient taking differently: Take 7.5 mg by mouth 2 (two) times daily. Take 1 in the am and 1 at lunchtime as directed 04/06/22  Yes Dayton Scrape, Allyne Gee, FNP  Cinnamon 500 MG capsule Take 500 mg by mouth daily.   Yes [provider]  clopidogrel (PLAVIX) 75 MG tablet Take 75 mg by mouth daily. 06/23/21  Yes [provider]  isosorbide mononitrate (IMDUR) 30 MG 24 hr tablet Take 1 tablet (30 mg total) by mouth daily. 06/07/22  Yes Adhikari, Willia Craze, MD  JARDIANCE 25 MG TABS tablet Take 25 mg by mouth daily. 10/04/21  Yes [provider]  losartan (COZAAR) 25 MG tablet Take 0.5 tablets (12.5 mg total) by mouth daily. 06/07/22  Yes Burnadette Pop, MD  Multiple Vitamins-Minerals (MULTIVITAMIN WITH MINERALS) tablet Take 1 tablet by mouth daily.   Yes [provider]  pantoprazole (PROTONIX) 40 MG tablet TAKE 1 TABLET (40 MG TOTAL) BY MOUTH TWICE A DAY BEFORE MEALS Patient taking differently: Take 40 mg by mouth 2 (two) times daily. 04/12/22  Yes Olive Bass, FNP  dicyclomine (BENTYL) 20 MG tablet Take 1 tablet (20 mg total) by mouth 4 (four) times daily. Patient not taking: Reported on 06/15/2022 05/08/22   Olive Bass, FNP  oxyCODONE (OXY IR/ROXICODONE) 5 MG immediate release tablet Take 1 tablet (5 mg total) by mouth every 6 (six) hours as needed for moderate pain. Patient  not taking: Reported on 06/15/2022 06/07/22   Adam Phenix, PA-C    Current Facility-Administered Medications  Medication Dose Route Frequency Provider Last Rate Last Admin   0.9 %  sodium chloride infusion   Intravenous Continuous Gery Pray, MD 75 mL/hr at 06/15/22 0101 New Bag at 06/15/22 0101   busPIRone (BUSPAR) tablet 5 mg  5 mg Oral QHS Crosley, Debby, MD   5 mg at 06/15/22 8295   busPIRone (BUSPAR) tablet 7.5 mg   7.5 mg Oral BID WC Crosley, Debby, MD   7.5 mg at 06/15/22 1316   enoxaparin (LOVENOX) injection 40 mg  40 mg Subcutaneous Q24H Crosley, Debby, MD   40 mg at 06/15/22 0924   HYDROmorphone (DILAUDID) injection 0.5 mg  0.5 mg Intravenous Q4H PRN Jerald Kief, MD   0.5 mg at 06/15/22 1249   insulin aspart (novoLOG) injection 0-9 Units  0-9 Units Subcutaneous Q4H Jerald Kief, MD       isosorbide mononitrate (IMDUR) 24 hr tablet 30 mg  30 mg Oral Daily Crosley, Debby, MD   30 mg at 06/15/22 0917   losartan (COZAAR) tablet 12.5 mg  12.5 mg Oral Daily Crosley, Debby, MD   12.5 mg at 06/15/22 0917   melatonin tablet 5 mg  5 mg Oral QHS PRN Gery Pray, MD   5 mg at 06/15/22 0212   ondansetron (ZOFRAN) injection 4 mg  4 mg Intravenous Q6H PRN Crosley, Debby, MD       pantoprazole (PROTONIX) injection 40 mg  40 mg Intravenous Q12H Crosley, Debby, MD   40 mg at 06/15/22 0917   piperacillin-tazobactam (ZOSYN) IVPB 3.375 g  3.375 g Intravenous Q8H Schlossman, Erin, MD 12.5 mL/hr at 06/15/22 1314 3.375 g at 06/15/22 1314   senna-docusate (Senokot-S) tablet 1 tablet  1 tablet Oral QHS PRN Gery Pray, MD        Allergies as of 06/14/2022   (No Known Allergies)    History reviewed. No pertinent family history.  Social History   Socioeconomic History   Marital status: Married    Spouse name: Not on file   Number of children: Not on file   Years of education: Not on file   Highest education level: Not on file  Occupational History   Not on file  Tobacco Use   Smoking status: Former   Smokeless tobacco: Never  Vaping Use   Vaping Use: Never used  Substance and Sexual Activity   Alcohol use: Never   Drug use: Never   Sexual activity: Not on file  Other Topics Concern   Not on file  Social History Narrative   Not on file   Social Determinants of Health   Financial Resource Strain: Low Risk  (11/04/2021)   Overall Financial Resource Strain (CARDIA)    Difficulty of Paying Living  Expenses: Not hard at all  Food Insecurity: No Food Insecurity (06/15/2022)   Hunger Vital Sign    Worried About Running Out of Food in the Last Year: Never true    Ran Out of Food in the Last Year: Never true  Transportation Needs: No Transportation Needs (06/15/2022)   PRAPARE - Administrator, Civil Service (Medical): No    Lack of Transportation (Non-Medical): No  Physical Activity: Inactive (11/04/2021)   Exercise Vital Sign    Days of Exercise per Week: 0 days    Minutes of Exercise per Session: 0 min  Stress: No Stress Concern Present (11/04/2021)   [place]  Institute of Occupational Health - Occupational Stress Questionnaire    Feeling of Stress : Not at all  Social Connections: Moderately Integrated (11/04/2021)   Social Connection and Isolation Panel [NHANES]    Frequency of Communication with Friends and Family: More than three times a week    Frequency of Social Gatherings with Friends and Family: Twice a week    Attends Religious Services: More than 4 times per year    Active Member of [employer] or Organizations: No    Attends Banker Meetings: Never    Marital Status: Married  Catering manager Violence: Not At Risk (06/15/2022)   Humiliation, Afraid, Rape, and Kick questionnaire    Fear of Current or Ex-Partner: No    Emotionally Abused: No    Physically Abused: No    Sexually Abused: No    Review of Systems: As per HPI, all others negative.  Physical Exam: Vital signs in last 24 hours: Temp:  [97.9 F (36.6 C)-98.4 F (36.9 C)] 97.9 F (36.6 C) (05/16 0815) Pulse Rate:  [53-61] 61 (05/16 0815) Resp:  [17-20] 19 (05/16 0815) BP: (103-137)/(63-76) 103/70 (05/16 0815) SpO2:  [95 %-100 %] 96 % (05/16 0815) Weight:  [82.8 kg] 82.8 kg (05/16 0032) Last BM Date : 06/13/22 General:   Alert,  Well-developed, well-nourished, pleasant and cooperative in NAD Head:  Normocephalic and atraumatic. Eyes:  Sclera clear, no icterus.   Conjunctiva pink. Ears:   Normal auditory acuity. Nose:  No deformity, discharge,  or lesions. Mouth:  No deformity or lesions.  Oropharynx pink & moist. Neck:  Supple; no masses or thyromegaly. Lungs:  No respiratory distress Abdomen:  Soft, nontender and nondistended. No masses, hepatosplenomegaly or hernias noted. Without guarding, and without rebound.     Msk:  Symmetrical without gross deformities. Normal posture. Pulses:  Normal pulses noted. Extremities:  Without clubbing or edema. Neurologic:  Alert and  oriented x4;  grossly normal neurologically. Skin:  Intact without significant lesions or rashes. Psych:  Alert and cooperative. Normal mood and affect.   Lab Results: Recent Labs    06/14/22 1812 06/15/22 0119 06/15/22 0500  WBC 9.3 7.5 7.8  HGB 14.0 13.4 13.4  HCT 41.8 40.4 41.0  PLT 326 268 245   BMET Recent Labs    06/13/22 1553 06/14/22 1812 06/15/22 0119 06/15/22 0500  NA 137 132*  --  133*  K 4.9 5.0  --  4.2  CL 100 96*  --  98  CO2 28 27  --  27  GLUCOSE 110* 109*  --  113*  BUN 24* 26*  --  21  CREATININE 0.88 0.97 0.97 0.98  CALCIUM 9.4 9.4  --  9.2   LFT Recent Labs    06/15/22 0500  PROT 7.4  ALBUMIN 3.3*  AST 65*  ALT 125*  ALKPHOS 392*  BILITOT 1.2   PT/INR Recent Labs    06/15/22 0500  LABPROT 14.0  INR 1.1    Studies/Results: NM HEPATOBILIARY LEAK (POST-SURGICAL)  Result Date: 06/15/2022 CLINICAL DATA:  Abdominal pain.  Recent cholecystectomy. EXAM: NUCLEAR MEDICINE HEPATOBILIARY IMAGING TECHNIQUE: Sequential images of the abdomen were obtained out to 60 minutes following intravenous administration of radiopharmaceutical. RADIOPHARMACEUTICALS:  5.3 mCi Tc-52m  Choletec IV COMPARISON:  CT 06/14/2022 FINDINGS: Prompt clearance radiotracer from blood pool and homogeneous uptake in liver. Counts are present within the common bile duct and small bowel by 15 minutes. No evidence of extra biliary radiotracer to suggest bile leak. No collection within  the  gallbladder fossa. Imaging continue for 90 minutes. IMPRESSION: No evidence of bile leak. Patent common bile duct. Electronically Signed   By: Genevive Bi M.D.   On: 06/15/2022 14:00   CT ABDOMEN PELVIS W CONTRAST  Result Date: 06/14/2022 CLINICAL DATA:  Biliary obstruction suspected (Ped 0-17y) nausea, vomiting, recent surgery, continued elevation transaminases EXAM: CT ABDOMEN AND PELVIS WITH CONTRAST TECHNIQUE: Multidetector CT imaging of the abdomen and pelvis was performed using the standard protocol following bolus administration of intravenous contrast. RADIATION DOSE REDUCTION: This exam was performed according to the departmental dose-optimization program which includes automated exposure control, adjustment of the mA and/or kV according to patient size and/or use of iterative reconstruction technique. CONTRAST:  OMNIPAQUE IOHEXOL 300 MG/ML  SOLN COMPARISON:  CT abdomen pelvis 06/03/2022, MRI abdomen 06/03/2022 FINDINGS: Lower chest: No acute abnormality. Hepatobiliary: No focal liver abnormality. Status post cholecystectomy. Interval development of a 3 x 3 cm peripherally enhancing fluid collection in the gallbladder fossa surgical bed. No biliary dilatation. Mild common bile duct wall thickening/enhancement. Pancreas: No focal lesion. Normal pancreatic contour. No surrounding inflammatory changes. No main pancreatic ductal dilatation. Spleen: Stable 2.8 x 2.8 cm peripherally enhancing centrally hypodense splenic lesion consistent with known hemangioma. Splenule noted. Spleen normal in size. Adrenals/Urinary Tract: No adrenal nodule bilaterally. Bilateral kidneys enhance symmetrically. Fluid density lesion of the kidneys likely represent simple renal cysts. Simple renal cysts, in the absence of clinically indicated signs/symptoms, require no independent follow-up. No hydronephrosis. No hydroureter. The urinary bladder is unremarkable. On delayed imaging, there is no urothelial wall  thickening and there are no filling defects in the opacified portions of the bilateral collecting systems or ureters. Stomach/Bowel: Stomach is within normal limits. No evidence of bowel wall thickening or dilatation. Stool throughout the majority of the colon. Appendix appears normal. Vascular/Lymphatic: No abdominal aorta or iliac aneurysm. Severe atherosclerotic plaque of the aorta and its branches. No abdominal, pelvic, or inguinal lymphadenopathy. Reproductive: The prostate is enlarged main up to 5 cm. Other: No intraperitoneal free fluid. No intraperitoneal free gas. No organized fluid collection. Musculoskeletal: Healing anterior abdominal incisions. No suspicious lytic or blastic osseous lesions. No acute displaced fracture. Multilevel degenerative changes of the spine. IMPRESSION: 1. Interval development of a 3 x 3 cm peripherally enhancing fluid collection in the gallbladder fossa surgical bed. Finding likely representing a developing abscess versus biloma. Cholangitis not excluded given mild common bile duct wall thickening/enhancement. 2. Prostatomegaly. 3.  Aortic Atherosclerosis (ICD10-I70.0). Electronically Signed   By: Tish Frederickson M.D.   On: 06/14/2022 21:35    Impression:   Abdominal pain post-ERCP and cholecystectomy. Resolved. Abnormal CT, HIDA negative, most likely post-op fluid collection. Elevated LFTs.  Plan:   No evidence bile leak or biliary obstruction on HIDA.  Watch LFTs.  ERCP recently with no stricture or stones. Advance diet as tolerated. No further recommendations from GI perspective. [facility] will sign-off; please call with questions; thank you for the consultation.  PCP can follow-up LFTs as outpatient.  Happy to see back in office as outpatient upon request.   LOS: 0 days  Cornesha Radziewicz M  06/15/2022, 2:17 PM  Cell [phone] If no answer or after 5 PM call [phone]

## 2022-06-15 NOTE — Progress Notes (Signed)
Subjective: Pt recently admitted for cholangititis and underwent lap chole.  Has had intermittent nausea and abd pain. CT concerning for post op abscess vs biloma  Objective: Vital signs in last 24 hours: Temp:  [98 F (36.7 C)-98.4 F (36.9 C)] 98.4 F (36.9 C) (05/16 0015) Pulse Rate:  [53-58] 57 (05/16 0015) Resp:  [17-20] 20 (05/16 0015) BP: (109-137)/(63-76) 137/70 (05/16 0015) SpO2:  [95 %-100 %] 95 % (05/16 0015) Weight:  [82.8 kg] 82.8 kg (05/16 0032)   Intake/Output from previous day: 05/15 0701 - 05/16 0700 In: 197.3 [I.V.:154.2; IV Piggyback:43.1] Out: 975 [Urine:975] Intake/Output this shift: Total I/O In: 197.3 [I.V.:154.2; IV Piggyback:43.1] Out: 975 [Urine:975]   General appearance: alert and cooperative GI: soft  Incision: healing well  Lab Results:  Recent Labs    06/15/22 0119 06/15/22 0500  WBC 7.5 7.8  HGB 13.4 13.4  HCT 40.4 41.0  PLT 268 245   BMET Recent Labs    06/13/22 1553 06/14/22 1812 06/15/22 0119  NA 137 132*  --   K 4.9 5.0  --   CL 100 96*  --   CO2 28 27  --   GLUCOSE 110* 109*  --   BUN 24* 26*  --   CREATININE 0.88 0.97 0.97  CALCIUM 9.4 9.4  --    PT/INR No results for input(s): "LABPROT", "INR" in the last 72 hours. ABG No results for input(s): "PHART", "HCO3" in the last 72 hours.  Invalid input(s): "PCO2", "PO2"  MEDS, Scheduled  busPIRone  5 mg Oral QHS   busPIRone  7.5 mg Oral BID WC   enoxaparin (LOVENOX) injection  40 mg Subcutaneous Q24H   isosorbide mononitrate  30 mg Oral Daily   losartan  12.5 mg Oral Daily   pantoprazole (PROTONIX) IV  40 mg Intravenous Q12H    Studies/Results: CT ABDOMEN PELVIS W CONTRAST  Result Date: 06/14/2022 CLINICAL DATA:  Biliary obstruction suspected (Ped 0-17y) nausea, vomiting, recent surgery, continued elevation transaminases EXAM: CT ABDOMEN AND PELVIS WITH CONTRAST TECHNIQUE: Multidetector CT imaging of the abdomen and pelvis was performed using the standard  protocol following bolus administration of intravenous contrast. RADIATION DOSE REDUCTION: This exam was performed according to the departmental dose-optimization program which includes automated exposure control, adjustment of the mA and/or kV according to patient size and/or use of iterative reconstruction technique. CONTRAST:  OMNIPAQUE IOHEXOL 300 MG/ML  SOLN COMPARISON:  CT abdomen pelvis 06/03/2022, MRI abdomen 06/03/2022 FINDINGS: Lower chest: No acute abnormality. Hepatobiliary: No focal liver abnormality. Status post cholecystectomy. Interval development of a 3 x 3 cm peripherally enhancing fluid collection in the gallbladder fossa surgical bed. No biliary dilatation. Mild common bile duct wall thickening/enhancement. Pancreas: No focal lesion. Normal pancreatic contour. No surrounding inflammatory changes. No main pancreatic ductal dilatation. Spleen: Stable 2.8 x 2.8 cm peripherally enhancing centrally hypodense splenic lesion consistent with known hemangioma. Splenule noted. Spleen normal in size. Adrenals/Urinary Tract: No adrenal nodule bilaterally. Bilateral kidneys enhance symmetrically. Fluid density lesion of the kidneys likely represent simple renal cysts. Simple renal cysts, in the absence of clinically indicated signs/symptoms, require no independent follow-up. No hydronephrosis. No hydroureter. The urinary bladder is unremarkable. On delayed imaging, there is no urothelial wall thickening and there are no filling defects in the opacified portions of the bilateral collecting systems or ureters. Stomach/Bowel: Stomach is within normal limits. No evidence of bowel wall thickening or dilatation. Stool throughout the majority of the colon. Appendix appears normal. Vascular/Lymphatic: No abdominal aorta or  iliac aneurysm. Severe atherosclerotic plaque of the aorta and its branches. No abdominal, pelvic, or inguinal lymphadenopathy. Reproductive: The prostate is enlarged main up to 5 cm. Other:  No intraperitoneal free fluid. No intraperitoneal free gas. No organized fluid collection. Musculoskeletal: Healing anterior abdominal incisions. No suspicious lytic or blastic osseous lesions. No acute displaced fracture. Multilevel degenerative changes of the spine. IMPRESSION: 1. Interval development of a 3 x 3 cm peripherally enhancing fluid collection in the gallbladder fossa surgical bed. Finding likely representing a developing abscess versus biloma. Cholangitis not excluded given mild common bile duct wall thickening/enhancement. 2. Prostatomegaly. 3.  Aortic Atherosclerosis (ICD10-I70.0). Electronically Signed   By: Tish Frederickson M.D.   On: 06/14/2022 21:35    Assessment: s/p  Patient Active Problem List   Diagnosis Date Noted   Elevated LFTs 06/15/2022   Intraabdominal fluid collection 06/14/2022   Choledocholithiasis with obstruction 06/04/2022   Ascending cholangitis 06/03/2022   Hyperbilirubinemia 06/03/2022   History of esophageal stricture 06/03/2022   Dysphagia 01/18/2022   Episodic lightheadedness 11/02/2021   Type 2 diabetes mellitus without complication, without long-term current use of insulin (HCC) 08/18/2021   Angina pectoris (HCC) 12/28/2020   DOE (dyspnea on exertion) 12/28/2020   CAD in native artery 02/17/2020   Dyslipidemia 02/17/2020   Prediabetes 02/17/2020   Shingles 02/17/2020   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 07/07/2015   Chest pain 07/07/2015   Plantar fasciitis of right foot 11/25/2014   B12 deficiency 08/22/2013   Erectile dysfunction 08/22/2013   Essential hypertension 03/11/2013   Gastro-esophageal reflux disease without esophagitis 03/11/2013   Paroxysmal atrial fibrillation (HCC) 03/11/2013   Hyperlipidemia 03/11/2013   Benign neoplasm of colon 01/17/2013   Benign localized prostatic hyperplasia without lower urinary tract symptoms (LUTS) 11/29/2012   Skin sensation disturbance 11/29/2012   Restless legs syndrome 08/16/2012    Chronic nonalcoholic liver disease 07/06/2012   Increased frequency of urination 07/06/2012   Syncope and collapse 07/06/2012   Backache 12/01/2011      Plan: Hold asa and plavix HIDA to r/o bile leak  LOS: 0 days     .Vanita Panda, MD [facility], [place]    06/15/2022 6:40 AM

## 2022-06-15 NOTE — H&P (Signed)
PCP:   Olive Bass, FNP   Chief Complaint:  Elevated LFTs  HPI: This is a  67 y.o. male with medical history significant of CAD, DM type 2, paroxysmal A-fib, HLD, HTN, and RLS. Patient recently admitted 5/4 - 5/8 w/ cholangitis/ cholecystitis/ choledocholithiasis. He also had E. coli bacteremia secondary to his ascending cholangitis.  S/p ercp w/ sphincterectomy 5/6. No stones seen, lap chole done 5/7.  Last Wednesday morning of discharge, patient had his first solid meal.  After discharge, patient states he had light meals Thursday through Monday because he felt slightly sick after eating.  Monday morning he had steak and eggs for breakfast. ~1hr later he did developed nausea vomiting, plus bad stomach pains. Had appt w/ PCP 5/14 and 5/15.  It was suggested he follow-up with his surgeon.  Per surgeon and PCP thought he had eaten much too quickly.  He is eating lites least since and has had no recurrent nausea, vomiting or abdominal pain.  Yesterday he had follow-up appointment with his PCP, lab work was done.  His LFTs were elevated compared to discharge.  He was directed to go to the ER.  He went to [facility] At discharge 5/8 his AST was 64, ALT 99, yesterday 5/14  his AST was 179 and ALT of 235.   In the ER CT abdomen pelvis showed: Interval development of a 3 x 3 cm peripherally enhancing fluid collection in the gallbladder fossa surgical bed. Finding likely representing a developing abscess versus biloma. Cholangitis not excluded given mild common bile duct wall thickening/enhancement. Patient's LFTs had decreased AST 179 => 89/ALT 235 =>159. Remains abdominal pain-free. GI (Dr. Bosie Clos) and gen surg (Dr. Maisie Fus) have been contacted by EDP.  Had a CAT scan requested for the a.m.  Patient given IV Zosyn in the ER.  Admission accepted  History of read by patient and chart review  Review of Systems:  Per HPI  Past Medical History: Past Medical History:  Diagnosis Date    Colon polyps    Coronary artery disease    History of blood clots    Hypertension    Past Surgical History:  Procedure Laterality Date   CHOLECYSTECTOMY N/A 06/06/2022   Procedure: LAPAROSCOPIC CHOLECYSTECTOMY;  Surgeon: Fritzi Mandes, MD;  Location: [facility];  Service: General;  Laterality: N/A;   CORONARY STENT INTERVENTION     ENDOSCOPIC RETROGRADE CHOLANGIOPANCREATOGRAPHY (ERCP) WITH PROPOFOL N/A 06/05/2022   Procedure: ENDOSCOPIC RETROGRADE CHOLANGIOPANCREATOGRAPHY (ERCP) WITH PROPOFOL;  Surgeon: Vida Rigger, MD;  Location: [facility];  Service: Gastroenterology;  Laterality: N/A;   REMOVAL OF STONES  06/05/2022   Procedure: REMOVAL OF STONES;  Surgeon: Vida Rigger, MD;  Location: [facility];  Service: Gastroenterology;;   Dennison Mascot  06/05/2022   Procedure: Dennison Mascot;  Surgeon: Vida Rigger, MD;  Location: [facility];  Service: Gastroenterology;;    Medications: Prior to Admission medications   Medication Sig Start Date End Date Taking? Authorizing Provider  acetaminophen (TYLENOL) 325 MG tablet Take 2-3 tablets (650-975 mg total) by mouth every 6 (six) hours as needed. 06/07/22   Adam Phenix, PA-C  aspirin EC 81 MG tablet Take 81 mg by mouth daily. Swallow whole.    [provider]  atorvastatin (LIPITOR) 80 MG tablet Take 1 tablet by mouth every evening. 01/12/21   [provider]  BLACK CURRANT SEED OIL PO Take 1 capsule by mouth daily.    [provider]  busPIRone (BUSPAR) 5 MG tablet Take 5 mg by  mouth at bedtime. 06/10/21   [provider]  busPIRone (BUSPAR) 7.5 MG tablet Take 1 in the am and 1 at lunchtime as directed Patient taking differently: Take 7.5 mg by mouth See admin instructions. Take 1 in the am and 1 at lunchtime as directed 04/06/22   Olive Bass, FNP  Cinnamon 500 MG capsule Take 500 mg by mouth daily.    [provider]  clopidogrel (PLAVIX) 75 MG tablet Take 75 mg by mouth daily. 06/23/21    [provider]  dicyclomine (BENTYL) 20 MG tablet Take 1 tablet (20 mg total) by mouth 4 (four) times daily. 05/08/22   Olive Bass, FNP  isosorbide mononitrate (IMDUR) 30 MG 24 hr tablet Take 1 tablet (30 mg total) by mouth daily. 06/07/22   Burnadette Pop, MD  JARDIANCE 25 MG TABS tablet Take 25 mg by mouth daily. 10/04/21   [provider]  losartan (COZAAR) 25 MG tablet Take 0.5 tablets (12.5 mg total) by mouth daily. 06/07/22   Burnadette Pop, MD  Multiple Vitamins-Minerals (MULTIVITAMIN WITH MINERALS) tablet Take 1 tablet by mouth daily.    [provider]  oxyCODONE (OXY IR/ROXICODONE) 5 MG immediate release tablet Take 1 tablet (5 mg total) by mouth every 6 (six) hours as needed for moderate pain. 06/07/22   Adam Phenix, PA-C  pantoprazole (PROTONIX) 40 MG tablet TAKE 1 TABLET (40 MG TOTAL) BY MOUTH TWICE A DAY BEFORE MEALS Patient taking differently: Take 40 mg by mouth 2 (two) times daily. 04/12/22   Olive Bass, FNP    Allergies:  No Known Allergies  Social History:  reports that he has quit smoking. He has never used smokeless tobacco. He reports that he does not drink alcohol and does not use drugs.  Family History: History reviewed. No pertinent family history.  Physical Exam: Vitals:   06/14/22 2241 06/14/22 2300 06/15/22 0015 06/15/22 0032  BP:  134/73 137/70   Pulse:  (!) 53 (!) 57   Resp:  17 20   Temp: 98.1 F (36.7 C)  98.4 F (36.9 C)   TempSrc: Oral  Oral   SpO2:  100% 95%   Weight:    82.8 kg  Height:    5\' 10"  (1.778 m)    General:  Alert and oriented times three, well developed and nourished, no acute distress Eyes: PERRLA, pink conjunctiva, no scleral icterus ENT: Moist oral mucosa, neck supple, no thyromegaly Lungs: clear to ascultation, no wheeze, no crackles, no use of accessory muscles Cardiovascular: regular rate and rhythm, no regurgitation, no gallops, no murmurs. No carotid bruits, no  JVD Abdomen: soft, positive BS, laparoscopic surgical incision sites healing well, no evidence of infection GU: not examined Neuro: CN II - XII grossly intact, sensation intact Musculoskeletal: strength 5/5 all extremities, no clubbing, cyanosis or edema Skin: no rash, no subcutaneous crepitation, no decubitus Psych: appropriate patient   Labs on Admission:  Recent Labs    06/13/22 1553 06/14/22 1812  NA 137 132*  K 4.9 5.0  CL 100 96*  CO2 28 27  GLUCOSE 110* 109*  BUN 24* 26*  CREATININE 0.88 0.97  CALCIUM 9.4 9.4   Recent Labs    06/13/22 1553 06/14/22 1812  AST 179* 89*  ALT 235* 159*  ALKPHOS 623* 488*  BILITOT 1.3* 1.2  PROT 7.1 8.2*  ALBUMIN 3.9 3.7   Recent Labs    06/13/22 1553 06/14/22 1812  LIPASE 56.0 46  AMYLASE 57  --  Recent Labs    06/13/22 1553 06/14/22 1812  WBC 9.5 9.3  NEUTROABS 6.3 6.2  HGB 13.8 14.0  HCT 40.4 41.8  MCV 83.9 83.4  PLT 323.0 326     Radiological Exams on Admission: CT ABDOMEN PELVIS W CONTRAST  Result Date: 06/14/2022 CLINICAL DATA:  Biliary obstruction suspected (Ped 0-17y) nausea, vomiting, recent surgery, continued elevation transaminases EXAM: CT ABDOMEN AND PELVIS WITH CONTRAST TECHNIQUE: Multidetector CT imaging of the abdomen and pelvis was performed using the standard protocol following bolus administration of intravenous contrast. RADIATION DOSE REDUCTION: This exam was performed according to the departmental dose-optimization program which includes automated exposure control, adjustment of the mA and/or kV according to patient size and/or use of iterative reconstruction technique. CONTRAST:  OMNIPAQUE IOHEXOL 300 MG/ML  SOLN COMPARISON:  CT abdomen pelvis 06/03/2022, MRI abdomen 06/03/2022 FINDINGS: Lower chest: No acute abnormality. Hepatobiliary: No focal liver abnormality. Status post cholecystectomy. Interval development of a 3 x 3 cm peripherally enhancing fluid collection in the gallbladder fossa  surgical bed. No biliary dilatation. Mild common bile duct wall thickening/enhancement. Pancreas: No focal lesion. Normal pancreatic contour. No surrounding inflammatory changes. No main pancreatic ductal dilatation. Spleen: Stable 2.8 x 2.8 cm peripherally enhancing centrally hypodense splenic lesion consistent with known hemangioma. Splenule noted. Spleen normal in size. Adrenals/Urinary Tract: No adrenal nodule bilaterally. Bilateral kidneys enhance symmetrically. Fluid density lesion of the kidneys likely represent simple renal cysts. Simple renal cysts, in the absence of clinically indicated signs/symptoms, require no independent follow-up. No hydronephrosis. No hydroureter. The urinary bladder is unremarkable. On delayed imaging, there is no urothelial wall thickening and there are no filling defects in the opacified portions of the bilateral collecting systems or ureters. Stomach/Bowel: Stomach is within normal limits. No evidence of bowel wall thickening or dilatation. Stool throughout the majority of the colon. Appendix appears normal. Vascular/Lymphatic: No abdominal aorta or iliac aneurysm. Severe atherosclerotic plaque of the aorta and its branches. No abdominal, pelvic, or inguinal lymphadenopathy. Reproductive: The prostate is enlarged main up to 5 cm. Other: No intraperitoneal free fluid. No intraperitoneal free gas. No organized fluid collection. Musculoskeletal: Healing anterior abdominal incisions. No suspicious lytic or blastic osseous lesions. No acute displaced fracture. Multilevel degenerative changes of the spine. IMPRESSION: 1. Interval development of a 3 x 3 cm peripherally enhancing fluid collection in the gallbladder fossa surgical bed. Finding likely representing a developing abscess versus biloma. Cholangitis not excluded given mild common bile duct wall thickening/enhancement. 2. Prostatomegaly. 3.  Aortic Atherosclerosis (ICD10-I70.0). Electronically Signed   By: Tish Frederickson M.D.    On: 06/14/2022 21:35    Assessment/Plan Present on Admission:  Intraabdominal fluid collection/elevated LFTs -N.p.o., IV fluid hydration -GI (Dr. Bosie Clos) and gen surg (Dr. Maisie Fus) contacted via epic chat.  They will do formal consult in a.m. -HIDA scan ordered for a.m. may need MRCP.  Defer to a.m./GI/surgery -Blood cultures ordered, patient is already received IV antibiotics -IV Zosyn continued -Zofran as needed nausea vomiting -CMP in a.m. -Patient aspirin and Plavix have been held.  Last dose taken 5/15 in AM -Of note today's LFTs decreased AST 89, ALT 159 which is down from 179 of AST yesterday and ALT of 225.   CAD in native artery -Aspirin, Plavix, atorvastatin on hold -Imdur and Cozaar resumed   Essential hypertension -Imdur and Cozaar ordered -As needed blood pressure medications ordered   Hyperlipidemia -Statin on hold   Paroxysmal atrial fibrillation (HCC) -Patient bradycardic.  Had been on propranolol which was discontinued  last admission. -Previous Plavix and aspirin on hold.  Initiate heparin drip  Harjit Leider 06/15/2022, 12:38 AM

## 2022-06-15 NOTE — Progress Notes (Signed)
  Transition of Care [facility]) Screening Note   Patient Details  Name: Jeremy Ryan Date of Birth: 09-04-1955   Transition of Care [facility]) CM/SW Contact:    Lanier Clam, RN Phone Number: 06/15/2022, 11:41 AM    Transition of Care Department [facility]) has reviewed patient and no TOC needs have been identified at this time. We will continue to monitor patient advancement through interdisciplinary progression rounds. If new patient transition needs arise, please place a TOC consult.

## 2022-06-15 NOTE — Progress Notes (Signed)
Hospitalist Transfer Note:  Transferring facility: [facility] Requesting provider: Dr. Dalene Seltzer (EDP at [facility]) Reason for transfer: admission for further evaluation and management of new finding of fluid collection in the gallbladder fossa.    67 y.o.  male  who recently underwent cholecystectomy/ERCP, who presented to [facility] ED for evaluation of interval worsening of transaminitis per outpatient labs performed by PCP on Tuesday, 06/13/2022.   Patient recently hospitalized in the [facility] health system with acute cholecystitis complicated by E. coli bacteremia for which she underwent cholecystectomy as well as ERCP before being discharged to home.  He experienced some nausea/vomiting 2 to 3 days ago which is subsequently improved, but prompted him to present to his PCP on Tuesday, 06/13/2022, at which time outpatient labs revealed interval increase in degree of transaminitis.  Following these results, patient was instructed by PCP to present to the ED. Denies any significant abdominal pain.   Vital signs in the ED were notable for the following: Afebrile.   Imaging notable for CT Abdo/pelvis which showed new 3 cm x 3 cm fluid collection within the gallbladder fossa, with this imaging also showing no evidence of common bile duct dilation, but does demonstrate common bile duct wall thickening.   EDP discussed with GI (Dr. Bosie Clos) and gen surg (Dr. Maisie Fus), both of whom will formally consult and see patient at [facility] in the AM; Dr. Maisie Fus conveys that gen surg will likely pursue HIDA, and conveys a differential that currently includes abscess vs biliary leak.   Medications administered prior to transfer included the following: zosyn.   Subsequently, I accepted this patient for transfer for inpatient admission to a med-tele bed at [facility] for further work-up and management of the above.      Nursing staff, Please call [facility] Admits & Consults System-Wide number on Amion [phone]) as soon as patient's arrival, so  appropriate admitting provider can evaluate the pt.     Newton Pigg, DO Hospitalist

## 2022-06-16 DIAGNOSIS — R110 Nausea: Secondary | ICD-10-CM | POA: Diagnosis not present

## 2022-06-16 DIAGNOSIS — K668 Other specified disorders of peritoneum: Secondary | ICD-10-CM | POA: Diagnosis not present

## 2022-06-16 DIAGNOSIS — T8189XA Other complications of procedures, not elsewhere classified, initial encounter: Secondary | ICD-10-CM | POA: Diagnosis not present

## 2022-06-16 DIAGNOSIS — R188 Other ascites: Secondary | ICD-10-CM | POA: Diagnosis not present

## 2022-06-16 LAB — COMPREHENSIVE METABOLIC PANEL
ALT: 86 U/L — ABNORMAL HIGH (ref 0–44)
AST: 40 U/L (ref 15–41)
Albumin: 3 g/dL — ABNORMAL LOW (ref 3.5–5.0)
Alkaline Phosphatase: 299 U/L — ABNORMAL HIGH (ref 38–126)
Anion gap: 8 (ref 5–15)
BUN: 21 mg/dL (ref 8–23)
CO2: 27 mmol/L (ref 22–32)
Calcium: 9.1 mg/dL (ref 8.9–10.3)
Chloride: 101 mmol/L (ref 98–111)
Creatinine, Ser: 1.01 mg/dL (ref 0.61–1.24)
GFR, Estimated: >60 mL/min (ref >60)
Glucose, Bld: 141 mg/dL — ABNORMAL HIGH (ref 70–99)
Potassium: 4 mmol/L (ref 3.5–5.1)
Sodium: 136 mmol/L (ref 135–145)
Total Bilirubin: 1.1 mg/dL (ref 0.3–1.2)
Total Protein: 6.9 g/dL (ref 6.5–8.1)

## 2022-06-16 LAB — CBC
HCT: 39.3 % (ref 39.0–52.0)
Hemoglobin: 12.8 g/dL — ABNORMAL LOW (ref 13.0–17.0)
MCH: 27.8 pg (ref 26.0–34.0)
MCHC: 32.6 g/dL (ref 30.0–36.0)
MCV: 85.4 fL (ref 80.0–100.0)
Platelets: 250 10*3/uL (ref 150–400)
RBC: 4.6 MIL/uL (ref 4.22–5.81)
RDW: 13.9 % (ref 11.5–15.5)
WBC: 6.9 10*3/uL (ref 4.0–10.5)
nRBC: 0 % (ref 0.0–0.2)

## 2022-06-16 LAB — GLUCOSE, CAPILLARY
Glucose-Capillary: 119 mg/dL — ABNORMAL HIGH (ref 70–99)
Glucose-Capillary: 128 mg/dL — ABNORMAL HIGH (ref 70–99)
Glucose-Capillary: 136 mg/dL — ABNORMAL HIGH (ref 70–99)
Glucose-Capillary: 97 mg/dL (ref 70–99)
Glucose-Capillary: 99 mg/dL (ref 70–99)

## 2022-06-16 LAB — CULTURE, BLOOD (ROUTINE X 2): Culture: NO GROWTH

## 2022-06-16 LAB — HIV ANTIBODY (ROUTINE TESTING W REFLEX): HIV Screen 4th Generation wRfx: NONREACTIVE

## 2022-06-16 MED ORDER — ONDANSETRON 4 MG PO TBDP
4.0000 mg | ORAL_TABLET | Freq: Three times a day (TID) | ORAL | Status: DC | PRN
  Administered 2022-06-16: 4 mg via ORAL
  Filled 2022-06-16: qty 1

## 2022-06-16 MED ORDER — ONDANSETRON 4 MG PO TBDP: 4.0000 mg | ORAL_TABLET | Freq: Three times a day (TID) | ORAL | 0 refills | Status: DC | PRN

## 2022-06-16 MED ORDER — PANTOPRAZOLE SODIUM 40 MG PO TBEC
40.0000 mg | DELAYED_RELEASE_TABLET | Freq: Two times a day (BID) | ORAL | Status: DC
  Administered 2022-06-16: 40 mg via ORAL
  Filled 2022-06-16: qty 1

## 2022-06-16 MED ORDER — ACETAMINOPHEN 325 MG PO TABS
650.0000 mg | ORAL_TABLET | Freq: Four times a day (QID) | ORAL | Status: DC | PRN
  Administered 2022-06-16: 650 mg via ORAL
  Filled 2022-06-16: qty 2

## 2022-06-16 MED ORDER — OXYCODONE HCL 5 MG PO TABS: 5.0000 mg | ORAL_TABLET | Freq: Four times a day (QID) | ORAL | 0 refills | Status: DC | PRN

## 2022-06-16 NOTE — Progress Notes (Signed)
Patient discharge instructions explained to patient. Patient's pharmacy verified and follow-up appointments confirmed. PIV removed. Patient safely transported to main entrance via wheelchair by [facility].

## 2022-06-16 NOTE — Discharge Summary (Signed)
Physician Discharge Summary   Patient: Jeremy Ryan MRN: [id number] DOB: [birth year]  Admit date:     06/14/2022  Discharge date: 06/16/22  Discharge Physician: Rickey Barbara   PCP: Olive Bass, FNP   Recommendations at discharge:    Follow up with PCP in 1-2 weeks Follow up with General Surgery as scheduled Recommend checking LFT's in one week to ensure normalization Recommend resuming statin once LFT's confirmed normalized after re-check  NCCSR reviewed. Limited quantity of narcotic will be prescribed for continued acute post-op abd pain  Discharge Diagnoses: Principal Problem:   Intraabdominal fluid collection Active Problems:   CAD in native artery   Dyslipidemia   Essential hypertension   Paroxysmal atrial fibrillation (HCC)   Type 2 diabetes mellitus without complication, without long-term current use of insulin (HCC)   Hyperlipidemia   Elevated LFTs  Resolved Problems:   * No resolved hospital problems. *  Hospital Course: 67 y.o. male with medical history significant of CAD, DM type 2, paroxysmal A-fib, HLD, HTN, and RLS. Patient recently admitted 5/4 - 5/8 w/ cholangitis/ cholecystitis/ choledocholithiasis. He also had E. coli bacteremia secondary to his ascending cholangitis.  S/p ercp w/ sphincterectomy 5/6. No stones seen, lap chole done 5/7.    Pt later noted increased abd discomfort and n/v. CT in ED demonstrated fluid collection in the GB fossa, suggesting abscess vs biloma. General Surgery and GI consulted  Assessment and Plan:  Intraabdominal fluid collection/elevated LFTs -Pt underwent HIDA 5/16, reviewed. No evidence of bile leak and patent biliary ducts -Pt is continued on IV Zosyn  -Per General Surgery, collection on CT is likely more related to post-op fluid as opposed to abscess given normal WBC and lack of fevers -LFT's trending down -Pt was evaluated by GI. No further recs noted. GI had since signed off -Diet was advanced. Pt clear for  d/c per General Surgery 5/17 -OK to resume ASA and plavix per General Surgery    CAD in native artery -Aspirin, Plavix, atorvastatin presently initially on hold per Surgery recs -Imdur and Cozaar resumed -Now OK to resume ASA and plavix per General Surgery -Given presenting elevated LFT's, statin remains on hold at time of d/c. Consider resuming statin once LFT's are confirmed normal after re-check as outpatient    Essential hypertension -Imdur and Cozaar ordered -remained stable    Hyperlipidemia -Statin on hold per above    Paroxysmal atrial fibrillation (HCC) -Patient bradycardic.  Had been on propranolol which was discontinued last admission. -Previous Plavix and aspirin on hold, resume on d/c -Not on anticoag PTA       Consultants: General Surgery, GI Procedures performed: HIDA  Disposition: Home Diet recommendation:  Regular diet DISCHARGE MEDICATION: Allergies as of 06/16/2022   No Known Allergies      Medication List     STOP taking these medications    atorvastatin 80 MG tablet Commonly known as: LIPITOR   dicyclomine 20 MG tablet Commonly known as: BENTYL       TAKE these medications    acetaminophen 325 MG tablet Commonly known as: TYLENOL Take 2-3 tablets (650-975 mg total) by mouth every 6 (six) hours as needed.   aspirin EC 81 MG tablet Take 81 mg by mouth daily. Swallow whole.   BLACK CURRANT SEED OIL PO Take 1 capsule by mouth daily.   busPIRone 5 MG tablet Commonly known as: BUSPAR Take 5 mg by mouth at bedtime. What changed: Another medication with the same name was changed. Make sure  you understand how and when to take each.   busPIRone 7.5 MG tablet Commonly known as: BUSPAR Take 1 in the am and 1 at lunchtime as directed What changed:  how much to take how to take this when to take this   Cinnamon 500 MG capsule Take 500 mg by mouth daily.   clopidogrel 75 MG tablet Commonly known as: PLAVIX Take 75 mg by mouth  daily.   isosorbide mononitrate 30 MG 24 hr tablet Commonly known as: IMDUR Take 1 tablet (30 mg total) by mouth daily.   Jardiance 25 MG Tabs tablet Generic drug: empagliflozin Take 25 mg by mouth daily.   losartan 25 MG tablet Commonly known as: COZAAR Take 0.5 tablets (12.5 mg total) by mouth daily.   multivitamin with minerals tablet Take 1 tablet by mouth daily.   ondansetron 4 MG disintegrating tablet Commonly known as: ZOFRAN-ODT Take 1 tablet (4 mg total) by mouth every 8 (eight) hours as needed for nausea or vomiting.   oxyCODONE 5 MG immediate release tablet Commonly known as: Oxy IR/ROXICODONE Take 1 tablet (5 mg total) by mouth every 6 (six) hours as needed for moderate pain.   pantoprazole 40 MG tablet Commonly known as: PROTONIX TAKE 1 TABLET (40 MG TOTAL) BY MOUTH TWICE A DAY BEFORE MEALS What changed: See the new instructions.        Follow-up Information     Olive Bass, FNP Follow up in 2 week(s).   Specialty: Internal Medicine Why: Hospital follow up Contact information: [address] [place] [address] [phone]         [facility] Follow up.   Why: Hospital follow up, as scheduled Contact information: [address] [place] [address]               Discharge Exam: Ceasar Mons Weights   06/15/22 0032  Weight: 82.8 kg   General exam: Awake, laying in bed, in nad Respiratory system: Normal respiratory effort, no wheezing Cardiovascular system: regular rate, s1, s2 Gastrointestinal system: Soft, nondistended, positive BS Central nervous system: CN2-12 grossly intact, strength intact Extremities: Perfused, no clubbing Skin: Normal skin turgor, no notable skin lesions seen Psychiatry: Mood normal // no visual hallucinations   Condition at discharge: fair  The results of significant diagnostics from this hospitalization (including imaging,  microbiology, ancillary and laboratory) are listed below for reference.   Imaging Studies: NM HEPATOBILIARY LEAK (POST-SURGICAL)  Result Date: 06/15/2022 CLINICAL DATA:  Abdominal pain.  Recent cholecystectomy. EXAM: NUCLEAR MEDICINE HEPATOBILIARY IMAGING TECHNIQUE: Sequential images of the abdomen were obtained out to 60 minutes following intravenous administration of radiopharmaceutical. RADIOPHARMACEUTICALS:  5.3 mCi Tc-35m  Choletec IV COMPARISON:  CT 06/14/2022 FINDINGS: Prompt clearance radiotracer from blood pool and homogeneous uptake in liver. Counts are present within the common bile duct and small bowel by 15 minutes. No evidence of extra biliary radiotracer to suggest bile leak. No collection within the gallbladder fossa. Imaging continue for 90 minutes. IMPRESSION: No evidence of bile leak. Patent common bile duct. Electronically Signed   By: Genevive Bi M.D.   On: 06/15/2022 14:00   CT ABDOMEN PELVIS W CONTRAST  Result Date: 06/14/2022 CLINICAL DATA:  Biliary obstruction suspected (Ped 0-17y) nausea, vomiting, recent surgery, continued elevation transaminases EXAM: CT ABDOMEN AND PELVIS WITH CONTRAST TECHNIQUE: Multidetector CT imaging of the abdomen and pelvis was performed using the standard protocol following bolus administration of intravenous contrast. RADIATION DOSE  REDUCTION: This exam was performed according to the departmental dose-optimization program which includes automated exposure control, adjustment of the mA and/or kV according to patient size and/or use of iterative reconstruction technique. CONTRAST:  OMNIPAQUE IOHEXOL 300 MG/ML  SOLN COMPARISON:  CT abdomen pelvis 06/03/2022, MRI abdomen 06/03/2022 FINDINGS: Lower chest: No acute abnormality. Hepatobiliary: No focal liver abnormality. Status post cholecystectomy. Interval development of a 3 x 3 cm peripherally enhancing fluid collection in the gallbladder fossa surgical bed. No biliary dilatation. Mild common bile  duct wall thickening/enhancement. Pancreas: No focal lesion. Normal pancreatic contour. No surrounding inflammatory changes. No main pancreatic ductal dilatation. Spleen: Stable 2.8 x 2.8 cm peripherally enhancing centrally hypodense splenic lesion consistent with known hemangioma. Splenule noted. Spleen normal in size. Adrenals/Urinary Tract: No adrenal nodule bilaterally. Bilateral kidneys enhance symmetrically. Fluid density lesion of the kidneys likely represent simple renal cysts. Simple renal cysts, in the absence of clinically indicated signs/symptoms, require no independent follow-up. No hydronephrosis. No hydroureter. The urinary bladder is unremarkable. On delayed imaging, there is no urothelial wall thickening and there are no filling defects in the opacified portions of the bilateral collecting systems or ureters. Stomach/Bowel: Stomach is within normal limits. No evidence of bowel wall thickening or dilatation. Stool throughout the majority of the colon. Appendix appears normal. Vascular/Lymphatic: No abdominal aorta or iliac aneurysm. Severe atherosclerotic plaque of the aorta and its branches. No abdominal, pelvic, or inguinal lymphadenopathy. Reproductive: The prostate is enlarged main up to 5 cm. Other: No intraperitoneal free fluid. No intraperitoneal free gas. No organized fluid collection. Musculoskeletal: Healing anterior abdominal incisions. No suspicious lytic or blastic osseous lesions. No acute displaced fracture. Multilevel degenerative changes of the spine. IMPRESSION: 1. Interval development of a 3 x 3 cm peripherally enhancing fluid collection in the gallbladder fossa surgical bed. Finding likely representing a developing abscess versus biloma. Cholangitis not excluded given mild common bile duct wall thickening/enhancement. 2. Prostatomegaly. 3.  Aortic Atherosclerosis (ICD10-I70.0). Electronically Signed   By: Tish Frederickson M.D.   On: 06/14/2022 21:35   DG ERCP  Result Date:  06/05/2022 CLINICAL DATA:  ERCP for choledocholithiasis EXAM: ERCP TECHNIQUE: Multiple spot images obtained with the fluoroscopic device and submitted for interpretation post-procedure. FLUOROSCOPY TIME: FLUOROSCOPY TIME 3.3 mGy COMPARISON:  MRCP-06/03/2022 FINDINGS: Nine spot intraoperative fluoroscopic images of the right upper abdominal quadrant during ERCP are provided for review. Initial image demonstrates an ERCP probe overlying the right upper abdominal quadrant. There is selective cannulation and opacification of the common bile duct which appears dilated centrally. Subsequent images demonstrate insufflation of a balloon within the central aspect of the CBD with subsequent biliary sweeping and presumed sphincterotomy. IMPRESSION: ERCP with biliary sweeping and presumed sphincterotomy as above. These images were submitted for radiologic interpretation only. Please see the procedural report for the amount of contrast and the fluoroscopy time utilized. Electronically Signed   By: Simonne Come M.D.   On: 06/05/2022 14:43   MR ABDOMEN MRCP W WO CONTAST  Result Date: 06/03/2022 CLINICAL DATA:  Cholangitis EXAM: MRI ABDOMEN WITHOUT AND WITH CONTRAST (INCLUDING MRCP) TECHNIQUE: Multiplanar multisequence MR imaging of the abdomen was performed both before and after the administration of intravenous contrast. Heavily T2-weighted images of the biliary and pancreatic ducts were obtained, and three-dimensional MRCP images were rendered by post processing. CONTRAST:  8mL GADAVIST GADOBUTROL 1 MMOL/ML IV SOLN COMPARISON:  Same day CT of the abdomen and pelvis dated Jun 03, 2022 FINDINGS: Lower chest: No acute findings. Hepatobiliary: Mild heterogeneous enhancement  of the right lobe of the liver seen on arterial phase, likely perfusional. No suspicious liver lesions. Dilated gallbladder with mild gallbladder wall thickening. Mild intrahepatic and moderate common bile duct dilation, common bile duct measures up to 13 mm.  Stone of the distal common bile duct measuring 8 mm on series 4, image 18. Pancreas: No mass, inflammatory changes, or other parenchymal abnormality identified. Spleen: Enhancing lesion of the anterior spleen with progressively fills in on delayed imaging measuring 2.7 cm on series 22, image 27, consistent with a benign hemangioma. Adrenals/Urinary Tract: Bilateral adrenal glands are unremarkable. No hydronephrosis. T2 hyperintense renal lesions with no evidence of enhancement, consistent with simple cysts, no specific follow-up imaging is recommended. Stomach/Bowel: Visualized portions within the abdomen are unremarkable. Vascular/Lymphatic: No pathologically enlarged lymph nodes identified. No abdominal aortic aneurysm demonstrated. Other:  None. Musculoskeletal: No suspicious bone lesions identified. IMPRESSION: 1. Common bile duct dilation and choledocholithiasis, 8 mm stone seen in the distal common bile duct. 2. Mild wall enhancement of the common bile duct, concerning for associated ascending cholangitis. 3. Dilated gallbladder with mild gallbladder wall thickening, concerning for cholecystitis. Electronically Signed   By: Allegra Lai M.D.   On: 06/03/2022 20:48   MR 3D Recon At Scanner  Result Date: 06/03/2022 CLINICAL DATA:  Cholangitis EXAM: MRI ABDOMEN WITHOUT AND WITH CONTRAST (INCLUDING MRCP) TECHNIQUE: Multiplanar multisequence MR imaging of the abdomen was performed both before and after the administration of intravenous contrast. Heavily T2-weighted images of the biliary and pancreatic ducts were obtained, and three-dimensional MRCP images were rendered by post processing. CONTRAST:  8mL GADAVIST GADOBUTROL 1 MMOL/ML IV SOLN COMPARISON:  Same day CT of the abdomen and pelvis dated Jun 03, 2022 FINDINGS: Lower chest: No acute findings. Hepatobiliary: Mild heterogeneous enhancement of the right lobe of the liver seen on arterial phase, likely perfusional. No suspicious liver lesions. Dilated  gallbladder with mild gallbladder wall thickening. Mild intrahepatic and moderate common bile duct dilation, common bile duct measures up to 13 mm. Stone of the distal common bile duct measuring 8 mm on series 4, image 18. Pancreas: No mass, inflammatory changes, or other parenchymal abnormality identified. Spleen: Enhancing lesion of the anterior spleen with progressively fills in on delayed imaging measuring 2.7 cm on series 22, image 27, consistent with a benign hemangioma. Adrenals/Urinary Tract: Bilateral adrenal glands are unremarkable. No hydronephrosis. T2 hyperintense renal lesions with no evidence of enhancement, consistent with simple cysts, no specific follow-up imaging is recommended. Stomach/Bowel: Visualized portions within the abdomen are unremarkable. Vascular/Lymphatic: No pathologically enlarged lymph nodes identified. No abdominal aortic aneurysm demonstrated. Other:  None. Musculoskeletal: No suspicious bone lesions identified. IMPRESSION: 1. Common bile duct dilation and choledocholithiasis, 8 mm stone seen in the distal common bile duct. 2. Mild wall enhancement of the common bile duct, concerning for associated ascending cholangitis. 3. Dilated gallbladder with mild gallbladder wall thickening, concerning for cholecystitis. Electronically Signed   By: Allegra Lai M.D.   On: 06/03/2022 20:48   CT ABDOMEN PELVIS W CONTRAST  Result Date: 06/03/2022 CLINICAL DATA:  Sepsis concern for potential biliary duct obstruction or disease - n/v abdominal pain and fevers EXAM: CT ABDOMEN AND PELVIS WITH CONTRAST TECHNIQUE: Multidetector CT imaging of the abdomen and pelvis was performed using the standard protocol following bolus administration of intravenous contrast. RADIATION DOSE REDUCTION: This exam was performed according to the departmental dose-optimization program which includes automated exposure control, adjustment of the mA and/or kV according to patient size and/or use of iterative  reconstruction technique. CONTRAST:  OMNIPAQUE IOHEXOL 300 MG/ML  SOLN COMPARISON:  March 31, 2022, November 16, 2011 FINDINGS: Lower chest: No acute abnormality. Hepatobiliary: No new focal hepatic lesion. The gallbladder is distended with gallbladder wall thickening. No discrete adjacent fat stranding. However, there is mucosal enhancement throughout the gallbladder lumen as well as the common bile duct (series 5, image 60). Common bile duct is enlarged and measures 13 mm, previously 7 mm in 2022. No discrete choledocholithiasis are visualized. Mild central biliary ductal prominence, new since 2022. Pancreas: Unremarkable. No pancreatic ductal dilatation or surrounding inflammatory changes. Spleen: Revisualization of a peripherally enhancing mass within the lateral spleen, present since 2013 and most consistent with a benign hemangioma. Adrenals/Urinary Tract: Adrenal glands are unremarkable. Kidneys enhance symmetrically. No hydronephrosis. No obstructing nephrolithiasis. Bladder is unremarkable for degree of distension. Stomach/Bowel: No evidence of bowel obstruction. Moderate to large colonic stool burden throughout the colon. Duodenal diverticulum. Appendix is normal. Vascular/Lymphatic: Atherosclerotic calcifications of the nonaneurysmal abdominal aorta. No new lymphadenopathy. Reproductive: Prostatomegaly. Other: No free air or free fluid. Musculoskeletal: No acute or significant osseous findings. IMPRESSION: 1. There is new mild gallbladder wall thickening and increased mucosal enhancement throughout the gallbladder lumen as well as the common bile duct. Common bile duct is enlarged and measures 13 mm, previously 7 mm in 2022. No discrete choledocholithiasis are visualized. Given reported clinical history of sepsis, findings are concerning for ascending cholangitis of uncertain etiology. 2. Moderate to large colonic stool burden throughout the colon. Aortic Atherosclerosis (ICD10-I70.0). Electronically  Signed   By: Meda Klinefelter M.D.   On: 06/03/2022 13:39   DG Chest 2 View  Result Date: 06/03/2022 CLINICAL DATA:  Dizziness.  Nausea vomiting and chills. EXAM: CHEST - 2 VIEW COMPARISON:  CTA chest, 02/19/2020. FINDINGS: Cardiac silhouette normal in size and configuration. Normal mediastinal and hilar contours. Clear lungs.  No pleural effusion or pneumothorax. Skeletal structures are intact. IMPRESSION: No active cardiopulmonary disease. Electronically Signed   By: Amie Portland M.D.   On: 06/03/2022 12:04    Microbiology: Results for orders placed or performed during the hospital encounter of 06/14/22  Culture, blood (Routine X 2) w Reflex to ID Panel     Status: None (Preliminary result)   Collection Time: 06/15/22  1:19 AM   Specimen: BLOOD  Result Value Ref Range Status   Specimen Description   Final    BLOOD BLOOD LEFT ARM Performed at [facility], [address] [place], [place] [address]    Special Requests   Final    BOTTLES DRAWN AEROBIC AND ANAEROBIC Blood Culture adequate volume Performed at [facility], [address], [place], [place] [address]    Culture   Final    NO GROWTH 1 DAY Performed at [facility], [address], [place], [place] [address]    Report Status PENDING  Incomplete  Culture, blood (Routine X 2) w Reflex to ID Panel     Status: None (Preliminary result)   Collection Time: 06/15/22  1:23 AM   Specimen: BLOOD  Result Value Ref Range Status   Specimen Description   Final    BLOOD BLOOD RIGHT HAND Performed at [facility], [address], [place], [place] [address]    Special Requests   Final    BOTTLES DRAWN AEROBIC AND ANAEROBIC Blood Culture adequate volume Performed at [facility], [address], [place], [place] [address]    Culture  Final    NO GROWTH 1 DAY Performed at [facility], [address], [place], [place] [address]    Report Status  PENDING  Incomplete    Labs: CBC: Recent Labs  Lab 06/13/22 1553 06/14/22 1812 06/15/22 0119 06/15/22 0500 06/16/22 0508  WBC 9.5 9.3 7.5 7.8 6.9  NEUTROABS 6.3 6.2  --  5.0  --   HGB 13.8 14.0 13.4 13.4 12.8*  HCT 40.4 41.8 40.4 41.0 39.3  MCV 83.9 83.4 84.5 85.2 85.4  PLT 323.0 326 268 245 250   Basic Metabolic Panel: Recent Labs  Lab 06/13/22 1553 06/14/22 1812 06/15/22 0119 06/15/22 0500 06/16/22 0508  NA 137 132*  --  133* 136  K 4.9 5.0  --  4.2 4.0  CL 100 96*  --  98 101  CO2 28 27  --  27 27  GLUCOSE 110* 109*  --  113* 141*  BUN 24* 26*  --  21 21  CREATININE 0.88 0.97 0.97 0.98 1.01  CALCIUM 9.4 9.4  --  9.2 9.1  MG  --   --   --  2.2  --    Liver Function Tests: Recent Labs  Lab 06/13/22 1553 06/14/22 1812 06/15/22 0500 06/16/22 0508  AST 179* 89* 65* 40  ALT 235* 159* 125* 86*  ALKPHOS 623* 488* 392* 299*  BILITOT 1.3* 1.2 1.2 1.1  PROT 7.1 8.2* 7.4 6.9  ALBUMIN 3.9 3.7 3.3* 3.0*   CBG: Recent Labs  Lab 06/15/22 2359 06/16/22 0205 06/16/22 0555 06/16/22 0742 06/16/22 1148  GLUCAP 136* 99 119* 128* 97    Discharge time spent: less than 30 minutes.  Signed: Rickey Barbara, MD [facility] 06/16/2022

## 2022-06-16 NOTE — Progress Notes (Signed)
[facility] Progress Note     Subjective: CC:  Ate 100% of breakfast (eggs, [place] sausage, bowl of cereal) and reports some nausea but denies emesis. States at home he was taking Catering manager which helped some. +flatus and BMs.  Objective: Vital signs in last 24 hours: Temp:  [97.7 F (36.5 C)-98 F (36.7 C)] 97.7 F (36.5 C) (05/17 0553) Pulse Rate:  [63-72] 64 (05/17 0553) Resp:  [18-20] 18 (05/17 0553) BP: (109-123)/(67-70) 120/67 (05/17 0553) SpO2:  [96 %] 96 % (05/17 0553) Last BM Date : 06/13/22  Intake/Output from previous day: 05/16 0701 - 05/17 0700 In: 2397.6 [P.O.:360; I.V.:1829.9; IV Piggyback:207.7] Out: 1375 [Urine:1375] Intake/Output this shift: No intake/output data recorded.  PE: Gen:  Alert, NAD, pleasant Pulm:  Normal effort ORA Abd: Soft, non-tender, non-distended, +BS, incisions c/d/i Skin: warm and dry, no rashes  Psych: A&Ox3   Lab Results:  Recent Labs    06/15/22 0500 06/16/22 0508  WBC 7.8 6.9  HGB 13.4 12.8*  HCT 41.0 39.3  PLT 245 250   BMET Recent Labs    06/15/22 0500 06/16/22 0508  NA 133* 136  K 4.2 4.0  CL 98 101  CO2 27 27  GLUCOSE 113* 141*  BUN 21 21  CREATININE 0.98 1.01  CALCIUM 9.2 9.1   PT/INR Recent Labs    06/15/22 0500  LABPROT 14.0  INR 1.1   CMP     Component Value Date/Time   NA 136 06/16/2022 0508   K 4.0 06/16/2022 0508   CL 101 06/16/2022 0508   CO2 27 06/16/2022 0508   GLUCOSE 141 (H) 06/16/2022 0508   BUN 21 06/16/2022 0508   CREATININE 1.01 06/16/2022 0508   CALCIUM 9.1 06/16/2022 0508   PROT 6.9 06/16/2022 0508   ALBUMIN 3.0 (L) 06/16/2022 0508   AST 40 06/16/2022 0508   ALT 86 (H) 06/16/2022 0508   ALKPHOS 299 (H) 06/16/2022 0508   BILITOT 1.1 06/16/2022 0508   GFRNONAA >60 06/16/2022 0508   Lipase     Component Value Date/Time   LIPASE 46 06/14/2022 1812       Studies/Results: NM HEPATOBILIARY LEAK (POST-SURGICAL)  Result Date: 06/15/2022 CLINICAL DATA:   Abdominal pain.  Recent cholecystectomy. EXAM: NUCLEAR MEDICINE HEPATOBILIARY IMAGING TECHNIQUE: Sequential images of the abdomen were obtained out to 60 minutes following intravenous administration of radiopharmaceutical. RADIOPHARMACEUTICALS:  5.3 mCi Tc-20m  Choletec IV COMPARISON:  CT 06/14/2022 FINDINGS: Prompt clearance radiotracer from blood pool and homogeneous uptake in liver. Counts are present within the common bile duct and small bowel by 15 minutes. No evidence of extra biliary radiotracer to suggest bile leak. No collection within the gallbladder fossa. Imaging continue for 90 minutes. IMPRESSION: No evidence of bile leak. Patent common bile duct. Electronically Signed   By: Genevive Bi M.D.   On: 06/15/2022 14:00   CT ABDOMEN PELVIS W CONTRAST  Result Date: 06/14/2022 CLINICAL DATA:  Biliary obstruction suspected (Ped 0-17y) nausea, vomiting, recent surgery, continued elevation transaminases EXAM: CT ABDOMEN AND PELVIS WITH CONTRAST TECHNIQUE: Multidetector CT imaging of the abdomen and pelvis was performed using the standard protocol following bolus administration of intravenous contrast. RADIATION DOSE REDUCTION: This exam was performed according to the departmental dose-optimization program which includes automated exposure control, adjustment of the mA and/or kV according to patient size and/or use of iterative reconstruction technique. CONTRAST:  OMNIPAQUE IOHEXOL 300 MG/ML  SOLN COMPARISON:  CT abdomen pelvis 06/03/2022, MRI abdomen 06/03/2022 FINDINGS: Lower chest: No  acute abnormality. Hepatobiliary: No focal liver abnormality. Status post cholecystectomy. Interval development of a 3 x 3 cm peripherally enhancing fluid collection in the gallbladder fossa surgical bed. No biliary dilatation. Mild common bile duct wall thickening/enhancement. Pancreas: No focal lesion. Normal pancreatic contour. No surrounding inflammatory changes. No main pancreatic ductal dilatation. Spleen:  Stable 2.8 x 2.8 cm peripherally enhancing centrally hypodense splenic lesion consistent with known hemangioma. Splenule noted. Spleen normal in size. Adrenals/Urinary Tract: No adrenal nodule bilaterally. Bilateral kidneys enhance symmetrically. Fluid density lesion of the kidneys likely represent simple renal cysts. Simple renal cysts, in the absence of clinically indicated signs/symptoms, require no independent follow-up. No hydronephrosis. No hydroureter. The urinary bladder is unremarkable. On delayed imaging, there is no urothelial wall thickening and there are no filling defects in the opacified portions of the bilateral collecting systems or ureters. Stomach/Bowel: Stomach is within normal limits. No evidence of bowel wall thickening or dilatation. Stool throughout the majority of the colon. Appendix appears normal. Vascular/Lymphatic: No abdominal aorta or iliac aneurysm. Severe atherosclerotic plaque of the aorta and its branches. No abdominal, pelvic, or inguinal lymphadenopathy. Reproductive: The prostate is enlarged main up to 5 cm. Other: No intraperitoneal free fluid. No intraperitoneal free gas. No organized fluid collection. Musculoskeletal: Healing anterior abdominal incisions. No suspicious lytic or blastic osseous lesions. No acute displaced fracture. Multilevel degenerative changes of the spine. IMPRESSION: 1. Interval development of a 3 x 3 cm peripherally enhancing fluid collection in the gallbladder fossa surgical bed. Finding likely representing a developing abscess versus biloma. Cholangitis not excluded given mild common bile duct wall thickening/enhancement. 2. Prostatomegaly. 3.  Aortic Atherosclerosis (ICD10-I70.0). Electronically Signed   By: Tish Frederickson M.D.   On: 06/14/2022 21:35    Anti-infectives: Anti-infectives (From admission, onward)    Start     Dose/Rate Route Frequency Ordered Stop   06/15/22 0600  piperacillin-tazobactam (ZOSYN) IVPB 3.375 g       See Hyperspace  for full Linked Orders Report.   3.375 g 12.5 mL/hr over 240 Minutes Intravenous Every 8 hours 06/14/22 2216     06/14/22 2215  piperacillin-tazobactam (ZOSYN) IVPB 3.375 g       See Hyperspace for full Linked Orders Report.   3.375 g 100 mL/hr over 30 Minutes Intravenous  Once 06/14/22 2216 06/14/22 2316        Assessment/Plan  Intra-abdominal fluid collection and elevated LFTs s/p lap chole 5/7 Dr. Freida Busman.  - afebrile, VSS, WBC WNL  - HIDA negative for post-operative bile leak.  - suspect fluid collection is blood/serosanguinous. No clinical evidence of infection.  - LFTs improving, tolerating PO; fluid collection may be causing some of his nausea. This will hopefully resolve with time. PRN anti-emetics and antacids.  - ok for discharge home from a [facility] standpoint. F/U provided. May resume ASA/plavix.    LOS: 1 day   I reviewed nursing notes, hospitalist notes, last 24 h vitals and pain scores, last 48 h intake and output, last 24 h labs and trends, and last 24 h imaging results.  This care required straight-forward level of medical decision making.   Hosie Spangle, PA-C [facility] Please see Amion for pager number during day hours 7:00am-4:30pm

## 2022-06-16 NOTE — Plan of Care (Signed)
  Problem: Education: Goal: Knowledge of General Education information will improve Description: Including pain rating scale, medication(s)/side effects and non-pharmacologic comfort measures Outcome: Completed/Met   Problem: Health Behavior/Discharge Planning: Goal: Ability to manage health-related needs will improve Outcome: Completed/Met   Problem: Clinical Measurements: Goal: Ability to maintain clinical measurements within normal limits will improve Outcome: Completed/Met Goal: Will remain free from infection Outcome: Completed/Met Goal: Diagnostic test results will improve Outcome: Completed/Met Goal: Respiratory complications will improve Outcome: Completed/Met Goal: Cardiovascular complication will be avoided Outcome: Completed/Met   Problem: Activity: Goal: Risk for activity intolerance will decrease Outcome: Completed/Met   Problem: Nutrition: Goal: Adequate nutrition will be maintained Outcome: Completed/Met   Problem: Coping: Goal: Level of anxiety will decrease Outcome: Completed/Met   Problem: Elimination: Goal: Will not experience complications related to bowel motility Outcome: Completed/Met Goal: Will not experience complications related to urinary retention Outcome: Completed/Met   Problem: Pain Managment: Goal: General experience of comfort will improve Outcome: Completed/Met   Problem: Safety: Goal: Ability to remain free from injury will improve Outcome: Completed/Met   Problem: Skin Integrity: Goal: Risk for impaired skin integrity will decrease Outcome: Completed/Met   Problem: Education: Goal: Ability to describe self-care measures that may prevent or decrease complications (Diabetes Survival Skills Education) will improve Outcome: Completed/Met Goal: Individualized Educational Video(s) Outcome: Completed/Met   Problem: Coping: Goal: Ability to adjust to condition or change in health will improve Outcome: Completed/Met   Problem:  Fluid Volume: Goal: Ability to maintain a balanced intake and output will improve Outcome: Completed/Met   Problem: Health Behavior/Discharge Planning: Goal: Ability to identify and utilize available resources and services will improve Outcome: Completed/Met Goal: Ability to manage health-related needs will improve Outcome: Completed/Met   Problem: Metabolic: Goal: Ability to maintain appropriate glucose levels will improve Outcome: Completed/Met   Problem: Nutritional: Goal: Maintenance of adequate nutrition will improve Outcome: Completed/Met Goal: Progress toward achieving an optimal weight will improve Outcome: Completed/Met   Problem: Skin Integrity: Goal: Risk for impaired skin integrity will decrease Outcome: Completed/Met   Problem: Tissue Perfusion: Goal: Adequacy of tissue perfusion will improve Outcome: Completed/Met   Problem: Education: Goal: Ability to describe self-care measures that may prevent or decrease complications (Diabetes Survival Skills Education) will improve Outcome: Completed/Met Goal: Individualized Educational Video(s) Outcome: Completed/Met   Problem: Coping: Goal: Ability to adjust to condition or change in health will improve Outcome: Completed/Met   Problem: Fluid Volume: Goal: Ability to maintain a balanced intake and output will improve Outcome: Completed/Met   Problem: Health Behavior/Discharge Planning: Goal: Ability to identify and utilize available resources and services will improve Outcome: Completed/Met Goal: Ability to manage health-related needs will improve Outcome: Completed/Met   Problem: Metabolic: Goal: Ability to maintain appropriate glucose levels will improve Outcome: Completed/Met   Problem: Nutritional: Goal: Maintenance of adequate nutrition will improve Outcome: Completed/Met Goal: Progress toward achieving an optimal weight will improve Outcome: Completed/Met   Problem: Skin Integrity: Goal: Risk for  impaired skin integrity will decrease Outcome: Completed/Met   Problem: Tissue Perfusion: Goal: Adequacy of tissue perfusion will improve Outcome: Completed/Met   

## 2022-06-16 NOTE — Progress Notes (Signed)
Pharmacy Antibiotic Note  Jeremy Ryan is a 67 y.o. male admitted on 06/14/2022 with intra-abdominal infection.  Pharmacy has been consulted for Zosyn dosing.  Day 2 Zosyn Afebrile WBC WNL  Plan: Continue current Zosyn dosing  Height: 5\' 10"  (177.8 cm) Weight: 82.8 kg (182 lb 8.7 oz) IBW/kg (Calculated) : 73  Temp (24hrs), Avg:97.8 F (36.6 C), Min:97.7 F (36.5 C), Max:98 F (36.7 C)  Recent Labs  Lab 06/13/22 1553 06/14/22 1812 06/15/22 0119 06/15/22 0500 06/16/22 0508  WBC 9.5 9.3 7.5 7.8 6.9  CREATININE 0.88 0.97 0.97 0.98 1.01     Estimated Creatinine Clearance: 72.3 mL/min (by C-G formula based on SCr of 1.01 mg/dL).    No Known Allergies   Hessie Knows, PharmD, BCPS Secure Chat if ?s 06/16/2022 9:00 AM

## 2022-06-18 LAB — CULTURE, BLOOD (ROUTINE X 2)

## 2022-06-19 ENCOUNTER — Telehealth: Payer: Self-pay

## 2022-06-19 NOTE — Transitions of Care (Post Inpatient/ED Visit) (Signed)
06/19/2022  Name: Jeremy Ryan MRN: [id number] DOB: 01-Jul-1955  Today's TOC FU Call Status: Today's TOC FU Call Status:: Successful TOC FU Call Competed TOC FU Call Complete Date: 06/19/22  Transition Care Management Follow-up Telephone Call Date of Discharge: 06/16/22 Discharge Facility: [facility]) Type of Discharge: Inpatient Admission Primary Inpatient Discharge Diagnosis:: complications of procedure How have you been since you were released from the hospital?: Better Any questions or concerns?: No  Items Reviewed: Did you receive and understand the discharge instructions provided?: Yes Medications obtained,verified, and reconciled?: Yes (Medications Reviewed) Any new allergies since your discharge?: No Dietary orders reviewed?: Yes Do you have support at home?: Yes People in Home: spouse  Medications Reviewed Today: Medications Reviewed Today     Reviewed by Karena Addison, LPN (Licensed Practical Nurse) on 06/19/22 at 1046  Med List Status: <None>   Medication Order Taking? Sig Documenting Provider Last Dose Status Informant  acetaminophen (TYLENOL) 325 MG tablet [id number] Yes Take 2-3 tablets (650-975 mg total) by mouth every 6 (six) hours as needed. Adam Phenix, PA-C Taking Active Self, Pharmacy Records  aspirin EC 81 MG tablet [id number] Yes Take 81 mg by mouth daily. Swallow whole. [provider] Taking Active Self, Pharmacy Records  BLACK CURRANT SEED OIL PO [id number] Yes Take 1 capsule by mouth daily. [provider] Taking Active Self, Pharmacy Records  busPIRone (BUSPAR) 5 MG tablet [id number] Yes Take 5 mg by mouth at bedtime. [provider] Taking Active Self, Pharmacy Records  busPIRone (BUSPAR) 7.5 MG tablet [id number] Yes Take 1 in the am and 1 at lunchtime as directed  Patient taking differently: Take 7.5 mg by mouth 2 (two) times daily. Take 1 in the am and 1 at lunchtime as directed   Olive Bass, FNP  Taking Active Self, Pharmacy Records  Cinnamon 500 MG capsule [id number] Yes Take 500 mg by mouth daily. [provider] Taking Active Self, Pharmacy Records  clopidogrel (PLAVIX) 75 MG tablet [id number] Yes Take 75 mg by mouth daily. [provider] Taking Active Self, Pharmacy Records  isosorbide mononitrate (IMDUR) 30 MG 24 hr tablet [id number] Yes Take 1 tablet (30 mg total) by mouth daily. Burnadette Pop, MD Taking Active Self, Pharmacy Records  JARDIANCE 25 MG TABS tablet [id number] Yes Take 25 mg by mouth daily. [provider] Taking Active Self, Pharmacy Records  losartan (COZAAR) 25 MG tablet [id number] Yes Take 0.5 tablets (12.5 mg total) by mouth daily. Burnadette Pop, MD Taking Active Self, Pharmacy Records  Multiple Vitamins-Minerals (MULTIVITAMIN WITH MINERALS) tablet [id number] Yes Take 1 tablet by mouth daily. [provider] Taking Active Self, Pharmacy Records  ondansetron (ZOFRAN-ODT) 4 MG disintegrating tablet [id number] Yes Take 1 tablet (4 mg total) by mouth every 8 (eight) hours as needed for nausea or vomiting. Jerald Kief, MD Taking Active   oxyCODONE (OXY IR/ROXICODONE) 5 MG immediate release tablet [id number] Yes Take 1 tablet (5 mg total) by mouth every 6 (six) hours as needed for moderate pain. Jerald Kief, MD Taking Active   pantoprazole (PROTONIX) 40 MG tablet [id number] Yes TAKE 1 TABLET (40 MG TOTAL) BY MOUTH TWICE A DAY BEFORE MEALS  Patient taking differently: Take 40 mg by mouth 2 (two) times daily.   Olive Bass, FNP Taking Active Self, Pharmacy Records            Home Care and Equipment/Supplies: Were Home Health Services Ordered?: NA Any new equipment or medical supplies ordered?: NA  Functional Questionnaire: Do you need assistance with bathing/showering or dressing?: No Do you need assistance with meal preparation?: No Do you need assistance with eating?: No Do you have difficulty maintaining  continence: No Do you need assistance with getting out of bed/getting out of a chair/moving?: No Do you have difficulty managing or taking your medications?: No  Follow up appointments reviewed: PCP Follow-up appointment confirmed?: Yes Date of PCP follow-up appointment?: 06/22/22 Follow-up Provider: Ria Clock Specialist [facility] Follow-up appointment confirmed?: NA Do you need transportation to your follow-up appointment?: No Do you understand care options if your condition(s) worsen?: Yes-patient verbalized understanding    SIGNATURE Karena Addison, LPN [facility] Nurse Health Advisor Direct Dial [phone]

## 2022-06-20 LAB — CULTURE, BLOOD (ROUTINE X 2)
Culture: NO GROWTH
Special Requests: ADEQUATE
Special Requests: ADEQUATE

## 2022-06-22 ENCOUNTER — Ambulatory Visit (INDEPENDENT_AMBULATORY_CARE_PROVIDER_SITE_OTHER): Payer: Medicare (Managed Care) | Admitting: Family

## 2022-06-22 ENCOUNTER — Encounter: Payer: Self-pay | Admitting: Family

## 2022-06-22 VITALS — BP 109/61 | HR 62 | Ht 70.0 in | Wt 184.6 lb

## 2022-06-22 DIAGNOSIS — Z7984 Long term (current) use of oral hypoglycemic drugs: Secondary | ICD-10-CM

## 2022-06-22 DIAGNOSIS — R7401 Elevation of levels of liver transaminase levels: Secondary | ICD-10-CM

## 2022-06-22 DIAGNOSIS — E119 Type 2 diabetes mellitus without complications: Secondary | ICD-10-CM | POA: Diagnosis not present

## 2022-06-22 NOTE — Patient Instructions (Signed)
  Please call your surgeon to let them know about continued symptoms;   [facility]  [address]  [address]  [place], [place] [address]  [phone]

## 2022-06-22 NOTE — Progress Notes (Signed)
Jeremy Ryan is a 67 y.o. male with the following history as recorded in EpicCare:  Patient Active Problem List   Diagnosis Date Noted   Elevated LFTs 06/15/2022   Intraabdominal fluid collection 06/14/2022   Choledocholithiasis with obstruction 06/04/2022   Ascending cholangitis 06/03/2022   Hyperbilirubinemia 06/03/2022   History of esophageal stricture 06/03/2022   Dysphagia 01/18/2022   Episodic lightheadedness 11/02/2021   Type 2 diabetes mellitus without complication, without long-term current use of insulin (HCC) 08/18/2021   Angina pectoris (HCC) 12/28/2020   DOE (dyspnea on exertion) 12/28/2020   CAD in native artery 02/17/2020   Dyslipidemia 02/17/2020   Prediabetes 02/17/2020   Shingles 02/17/2020   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 07/07/2015   Chest pain 07/07/2015   Plantar fasciitis of right foot 11/25/2014   B12 deficiency 08/22/2013   Erectile dysfunction 08/22/2013   Essential hypertension 03/11/2013   Gastro-esophageal reflux disease without esophagitis 03/11/2013   Paroxysmal atrial fibrillation (HCC) 03/11/2013   Hyperlipidemia 03/11/2013   Benign neoplasm of colon 01/17/2013   Benign localized prostatic hyperplasia without lower urinary tract symptoms (LUTS) 11/29/2012   Skin sensation disturbance 11/29/2012   Restless legs syndrome 08/16/2012   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     aspirin EC 81 MG tablet Take 81 mg by mouth daily. Swallow whole.     BLACK CURRANT SEED OIL PO Take 1 capsule by mouth daily.     busPIRone (BUSPAR) 5 MG tablet Take 5 mg by mouth at bedtime.     busPIRone (BUSPAR) 7.5 MG tablet Take 1 in the am and 1 at lunchtime  as directed (Patient taking differently: Take 7.5 mg by mouth 2 (two) times daily. Take 1 in the am and 1 at lunchtime as directed) 180 tablet 1   Cinnamon 500 MG capsule Take 500 mg by mouth daily.     clopidogrel (PLAVIX) 75 MG tablet Take 75 mg by mouth daily.     isosorbide mononitrate (IMDUR) 30 MG 24 hr tablet Take 1 tablet (30 mg total) by mouth daily.     JARDIANCE 25 MG TABS tablet Take 25 mg by mouth daily.     losartan (COZAAR) 25 MG tablet Take 0.5 tablets (12.5 mg total) by mouth daily.     Multiple Vitamins-Minerals (MULTIVITAMIN WITH MINERALS) tablet Take 1 tablet by mouth daily.     ondansetron (ZOFRAN-ODT) 4 MG disintegrating tablet Take 1 tablet (4 mg total) by mouth every 8 (eight) hours as needed for nausea or vomiting. 20 tablet 0   oxyCODONE (OXY IR/ROXICODONE) 5 MG immediate release tablet Take 1 tablet (5 mg total) by mouth every 6 (six) hours as needed for moderate pain. 15 tablet 0   pantoprazole (PROTONIX) 40 MG tablet TAKE 1 TABLET (40 MG TOTAL) BY MOUTH TWICE A DAY BEFORE MEALS (Patient taking differently: Take 40 mg by mouth 2 (two) times daily.) 180 tablet 1   No current facility-administered medications for this visit.    Allergies: Patient has no known allergies.  Past Medical History:  Diagnosis Date   Colon polyps    Coronary artery disease    History of blood clots    Hypertension     Past  Surgical History:  Procedure Laterality Date   CHOLECYSTECTOMY N/A 06/06/2022   Procedure: LAPAROSCOPIC CHOLECYSTECTOMY;  Surgeon: Fritzi Mandes, MD;  Location: [facility];  Service: General;  Laterality: N/A;   CORONARY STENT INTERVENTION     ENDOSCOPIC RETROGRADE CHOLANGIOPANCREATOGRAPHY (ERCP) WITH PROPOFOL N/A 06/05/2022   Procedure: ENDOSCOPIC RETROGRADE CHOLANGIOPANCREATOGRAPHY (ERCP) WITH PROPOFOL;  Surgeon: Vida Rigger, MD;  Location: [facility];  Service: Gastroenterology;  Laterality: N/A;   REMOVAL OF STONES  06/05/2022   Procedure: REMOVAL OF STONES;  Surgeon:  Vida Rigger, MD;  Location: [facility];  Service: Gastroenterology;;   Dennison Mascot  06/05/2022   Procedure: Dennison Mascot;  Surgeon: Vida Rigger, MD;  Location: [facility];  Service: Gastroenterology;;    No family history on file.  Social History   Tobacco Use   Smoking status: Former   Smokeless tobacco: Never  Substance Use Topics   Alcohol use: Never    Subjective:   Patient was re-admitted to hospital on Jun 14, 2022 with complications from initial gallbladder surgery/ extremely elevated LFTs; was admitted from Jun 14, 2022- Jun 16, 2022;  Continuing to follow bland diet/ still having some nausea after eating; has surgical follow up on July 04, 2022;     Objective:  Vitals:   06/22/22 1354  BP: 109/61  Pulse: 62  SpO2: 98%  Weight: 184 lb 9.6 oz (83.7 kg)  Height: 5\' 10"  (1.778 m)    General: Well developed, well nourished, in no acute distress  Skin : Warm and dry.  Head: Normocephalic and atraumatic  Lungs: Respirations unlabored; clear to auscultation bilaterally without wheeze, rales, rhonchi  CVS exam: normal rate and regular rhythm.  Neurologic: Alert and oriented; speech intact; face symmetrical; moves all extremities well; CNII-XII intact without focal deficit   Assessment:  1. Transaminitis   2. Type 2 diabetes mellitus without complication, without long-term current use of insulin (HCC)   3. Long term (current) use of oral hypoglycemic drugs     Plan:   Update labs today; he is encouraged to continue to follow bland diet recommendations and keep follow up with surgeon as scheduled;  &3.  Overdue to see his endocrinologist; check Hgba1c today; he is encouraged to schedule appointment;   No follow-ups on file.  Orders Placed This Encounter  Procedures   CBC with Differential/Platelet   Comp Met (CMET)   Hemoglobin A1c    Requested Prescriptions    No prescriptions requested or ordered in this encounter

## 2022-06-23 LAB — CBC WITH DIFFERENTIAL/PLATELET
Basophils Absolute: 0.1 10*3/uL (ref 0.0–0.1)
Basophils Relative: 1.3 % (ref 0.0–3.0)
Eosinophils Absolute: 0.2 10*3/uL (ref 0.0–0.7)
Eosinophils Relative: 2.3 % (ref 0.0–5.0)
HCT: 37.7 % — ABNORMAL LOW (ref 39.0–52.0)
Hemoglobin: 12.5 g/dL — ABNORMAL LOW (ref 13.0–17.0)
Lymphocytes Relative: 22 % (ref 12.0–46.0)
Lymphs Abs: 1.5 10*3/uL (ref 0.7–4.0)
MCHC: 33.2 g/dL (ref 30.0–36.0)
MCV: 83.6 fl (ref 78.0–100.0)
Monocytes Absolute: 0.7 10*3/uL (ref 0.1–1.0)
Monocytes Relative: 10.7 % (ref 3.0–12.0)
Neutro Abs: 4.2 10*3/uL (ref 1.4–7.7)
Neutrophils Relative %: 63.7 % (ref 43.0–77.0)
Platelets: 302 10*3/uL (ref 150.0–400.0)
RBC: 4.52 Mil/uL (ref 4.22–5.81)
RDW: 14.4 % (ref 11.5–15.5)
WBC: 6.7 10*3/uL (ref 4.0–10.5)

## 2022-06-23 LAB — COMPREHENSIVE METABOLIC PANEL
ALT: 28 U/L (ref 0–53)
AST: 22 U/L (ref 0–37)
Albumin: 3.7 g/dL (ref 3.5–5.2)
Alkaline Phosphatase: 194 U/L — ABNORMAL HIGH (ref 39–117)
BUN: 18 mg/dL (ref 6–23)
CO2: 27 mEq/L (ref 19–32)
Calcium: 9.2 mg/dL (ref 8.4–10.5)
Chloride: 103 mEq/L (ref 96–112)
Creatinine, Ser: 1.06 mg/dL (ref 0.40–1.50)
GFR: 71.89 mL/min (ref >60.00)
Glucose, Bld: 122 mg/dL — ABNORMAL HIGH (ref 70–99)
Potassium: 4.1 mEq/L (ref 3.5–5.1)
Sodium: 139 mEq/L (ref 135–145)
Total Bilirubin: 1.1 mg/dL (ref 0.2–1.2)
Total Protein: 6.5 g/dL (ref 6.0–8.3)

## 2022-06-23 LAB — HEMOGLOBIN A1C: Hgb A1c MFr Bld: 5.8 % (ref 4.6–6.5)

## 2022-07-10 ENCOUNTER — Telehealth: Payer: Self-pay

## 2022-07-10 NOTE — Transitions of Care (Post Inpatient/ED Visit) (Signed)
07/10/2022  Name: Jeremy Ryan MRN: [id number] DOB: 1955/09/20  Today's TOC FU Call Status: Today's TOC FU Call Status:: Successful TOC FU Call Competed TOC FU Call Complete Date: 07/10/22  Transition Care Management Follow-up Telephone Call Date of Discharge: 07/06/22 Discharge Facility: Other ([facility]) Name of Other ([facility]) [facility]charge Facility: [facility] Type of Discharge: Inpatient Admission Primary Inpatient Discharge Diagnosis:: syncope How have you been since you were released from the hospital?: Better Any questions or concerns?: No  Items Reviewed: Did you receive and understand the discharge instructions provided?: Yes Medications obtained,verified, and reconciled?: Yes (Medications Reviewed) Any new allergies since your discharge?: No Dietary orders reviewed?: Yes Do you have support at home?: Yes People in Home: spouse  Medications Reviewed Today: Medications Reviewed Today     Reviewed by Karena Addison, LPN (Licensed Practical Nurse) on 07/10/22 at 1017  Med List Status: <None>   Medication Order Taking? Sig Documenting Provider Last Dose Status Informant  acetaminophen (TYLENOL) 325 MG tablet [id number] Yes Take 2-3 tablets (650-975 mg total) by mouth every 6 (six) hours as needed. Adam Phenix, PA-C Taking Active Self, Pharmacy Records  aspirin EC 81 MG tablet [id number] Yes Take 81 mg by mouth daily. Swallow whole. [provider] Taking Active Self, Pharmacy Records  BLACK CURRANT SEED OIL PO [id number] Yes Take 1 capsule by mouth daily. [provider] Taking Active Self, Pharmacy Records  busPIRone (BUSPAR) 5 MG tablet [id number] Yes Take 5 mg by mouth at bedtime. [provider] Taking Active Self, Pharmacy Records  busPIRone (BUSPAR) 7.5 MG tablet [id number] Yes Take 1 in the am and 1 at lunchtime as directed  Patient taking differently: Take 7.5 mg by mouth 2 (two) times daily. Take 1 in the am and 1 at  lunchtime as directed   Olive Bass, FNP Taking Active Self, Pharmacy Records  Cinnamon 500 MG capsule [id number] Yes Take 500 mg by mouth daily. [provider] Taking Active Self, Pharmacy Records  clopidogrel (PLAVIX) 75 MG tablet [id number] Yes Take 75 mg by mouth daily. [provider] Taking Active Self, Pharmacy Records  isosorbide mononitrate (IMDUR) 30 MG 24 hr tablet [id number] Yes Take 1 tablet (30 mg total) by mouth daily. Burnadette Pop, MD Taking Active Self, Pharmacy Records  JARDIANCE 25 MG TABS tablet [id number] Yes Take 25 mg by mouth daily. [provider] Taking Active Self, Pharmacy Records  losartan (COZAAR) 25 MG tablet [id number] Yes Take 0.5 tablets (12.5 mg total) by mouth daily. Burnadette Pop, MD Taking Active Self, Pharmacy Records  Multiple Vitamins-Minerals (MULTIVITAMIN WITH MINERALS) tablet [id number] Yes Take 1 tablet by mouth daily. [provider] Taking Active Self, Pharmacy Records  ondansetron (ZOFRAN-ODT) 4 MG disintegrating tablet [id number] Yes Take 1 tablet (4 mg total) by mouth every 8 (eight) hours as needed for nausea or vomiting. Jerald Kief, MD Taking Active   oxyCODONE (OXY IR/ROXICODONE) 5 MG immediate release tablet [id number] Yes Take 1 tablet (5 mg total) by mouth every 6 (six) hours as needed for moderate pain. Jerald Kief, MD Taking Active   pantoprazole (PROTONIX) 40 MG tablet [id number] Yes TAKE 1 TABLET (40 MG TOTAL) BY MOUTH TWICE A DAY BEFORE MEALS  Patient taking differently: Take 40 mg by mouth 2 (two) times daily.   Olive Bass, FNP Taking Active Self, Pharmacy Records            Home Care and Equipment/Supplies: Were Home Health Services Ordered?: NA Any new equipment  or medical supplies ordered?: NA  Functional Questionnaire: Do you need assistance with bathing/showering or dressing?: No Do you need assistance with meal preparation?: No Do you need assistance with  eating?: No Do you have difficulty maintaining continence: No Do you need assistance with getting out of bed/getting out of a chair/moving?: No Do you have difficulty managing or taking your medications?: No  Follow up appointments reviewed: PCP Follow-up appointment confirmed?: Yes Date of PCP follow-up appointment?: 07/14/22 Follow-up Provider: [facility] Follow-up appointment confirmed?: No Reason Specialist Follow-Up Not Confirmed: Patient has Specialist Provider Number and will Call for Appointment Do you need transportation to your follow-up appointment?: No Do you understand care options if your condition(s) worsen?: Yes-patient verbalized understanding    SIGNATURE Karena Addison, LPN [facility] Nurse Health Advisor Direct Dial [phone]

## 2022-07-14 ENCOUNTER — Encounter: Payer: Self-pay | Admitting: Family

## 2022-07-14 ENCOUNTER — Ambulatory Visit (INDEPENDENT_AMBULATORY_CARE_PROVIDER_SITE_OTHER): Payer: Medicare (Managed Care) | Admitting: Family

## 2022-07-14 VITALS — BP 100/62 | HR 64 | Ht 70.0 in | Wt 185.8 lb

## 2022-07-14 DIAGNOSIS — I10 Essential (primary) hypertension: Secondary | ICD-10-CM

## 2022-07-14 DIAGNOSIS — R55 Syncope and collapse: Secondary | ICD-10-CM

## 2022-07-14 NOTE — Patient Instructions (Addendum)
Hold the Losartan and Amlodipine and Propanolol and Jardiance for now; stay on the Imdur until you see you cardiologist next week;   If your top blood pressure reading is above 140, you can take 25 mg of Losartan as needed;   Please take your medication bottles to the cardiologist next week;

## 2022-07-14 NOTE — Progress Notes (Signed)
Jeremy Ryan is a 67 y.o. male with the following history as recorded in EpicCare:  Patient Active Problem List   Diagnosis Date Noted   Elevated LFTs 06/15/2022   Intraabdominal fluid collection 06/14/2022   Choledocholithiasis with obstruction 06/04/2022   Ascending cholangitis 06/03/2022   Hyperbilirubinemia 06/03/2022   History of esophageal stricture 06/03/2022   Dysphagia 01/18/2022   Episodic lightheadedness 11/02/2021   Type 2 diabetes mellitus without complication, without long-term current use of insulin (HCC) 08/18/2021   Angina pectoris (HCC) 12/28/2020   DOE (dyspnea on exertion) 12/28/2020   CAD in native artery 02/17/2020   Dyslipidemia 02/17/2020   Prediabetes 02/17/2020   Shingles 02/17/2020   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 07/07/2015   Chest pain 07/07/2015   Plantar fasciitis of right foot 11/25/2014   B12 deficiency 08/22/2013   Erectile dysfunction 08/22/2013   Essential hypertension 03/11/2013   Gastro-esophageal reflux disease without esophagitis 03/11/2013   Paroxysmal atrial fibrillation (HCC) 03/11/2013   Hyperlipidemia 03/11/2013   Benign neoplasm of colon 01/17/2013   Benign localized prostatic hyperplasia without lower urinary tract symptoms (LUTS) 11/29/2012   Skin sensation disturbance 11/29/2012   Restless legs syndrome 08/16/2012   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     aspirin EC 81 MG tablet Take 81 mg by mouth daily. Swallow whole.     BLACK CURRANT SEED OIL PO Take 1 capsule by mouth daily.     busPIRone (BUSPAR) 5 MG tablet Take 5 mg by mouth at bedtime.     busPIRone (BUSPAR) 7.5 MG tablet Take 1 in the am and 1 at lunchtime  as directed (Patient taking differently: Take 7.5 mg by mouth 2 (two) times daily. Take 1 in the am and 1 at lunchtime as directed) 180 tablet 1   Cinnamon 500 MG capsule Take 500 mg by mouth daily.     clopidogrel (PLAVIX) 75 MG tablet Take 75 mg by mouth daily.     isosorbide mononitrate (IMDUR) 30 MG 24 hr tablet Take 1 tablet (30 mg total) by mouth daily.     Multiple Vitamins-Minerals (MULTIVITAMIN WITH MINERALS) tablet Take 1 tablet by mouth daily.     ondansetron (ZOFRAN-ODT) 4 MG disintegrating tablet Take 1 tablet (4 mg total) by mouth every 8 (eight) hours as needed for nausea or vomiting. 20 tablet 0   oxyCODONE (OXY IR/ROXICODONE) 5 MG immediate release tablet Take 1 tablet (5 mg total) by mouth every 6 (six) hours as needed for moderate pain. 15 tablet 0   pantoprazole (PROTONIX) 40 MG tablet TAKE 1 TABLET (40 MG TOTAL) BY MOUTH TWICE A DAY BEFORE MEALS 180 tablet 1   amLODipine (NORVASC) 10 MG tablet Take by mouth. (Patient not taking: Reported on 07/14/2022)     JARDIANCE 25 MG TABS tablet Take 25 mg by mouth daily. (Patient not taking: Reported on 07/14/2022)     losartan (COZAAR) 25 MG tablet Take 0.5 tablets (12.5 mg total) by mouth daily. (Patient not taking: Reported on 07/14/2022)     propranolol (INDERAL) 40 MG tablet Take by mouth. (Patient not taking: Reported on 07/14/2022)     No current facility-administered medications for this visit.    Allergies: Patient has  no known allergies.  Past Medical History:  Diagnosis Date   Colon polyps    Coronary artery disease    History of blood clots    Hypertension     Past Surgical History:  Procedure Laterality Date   CHOLECYSTECTOMY N/A 06/06/2022   Procedure: LAPAROSCOPIC CHOLECYSTECTOMY;  Surgeon: Fritzi Mandes, MD;  Location: [facility];  Service: General;  Laterality: N/A;   CORONARY STENT INTERVENTION     ENDOSCOPIC RETROGRADE CHOLANGIOPANCREATOGRAPHY (ERCP) WITH PROPOFOL N/A 06/05/2022   Procedure: ENDOSCOPIC RETROGRADE  CHOLANGIOPANCREATOGRAPHY (ERCP) WITH PROPOFOL;  Surgeon: Vida Rigger, MD;  Location: [facility];  Service: Gastroenterology;  Laterality: N/A;   REMOVAL OF STONES  06/05/2022   Procedure: REMOVAL OF STONES;  Surgeon: Vida Rigger, MD;  Location: [facility];  Service: Gastroenterology;;   Dennison Mascot  06/05/2022   Procedure: Dennison Mascot;  Surgeon: Vida Rigger, MD;  Location: [facility];  Service: Gastroenterology;;    No family history on file.  Social History   Tobacco Use   Smoking status: Former   Smokeless tobacco: Never  Substance Use Topics   Alcohol use: Never    Subjective:  Patient was hospitalized from 07/05/22-/76/24 with episode of syncope; thought to be related to low blood pressure secondary to weight loss; Did see endocrine at the end of May- Jardiance is now at 12.5 mg daily; considering to stop Jardiance at follow up visit in September;  Cardiology has historically been managing patient's blood pressure- scheduled to see them next week;  Unfortunately, at today's appointment, patient does not have his medications with him for review and there is continued confusion about what medication/ what dosage of medication he is actually taking; Patient does not think he has been taking Amlodipine or Propanolol "for a while." Is unsure of the dosage of his Losartan- thinks currently at 25 mg?     Objective:  Vitals:   07/14/22 1035  BP: 100/62  Pulse: 64  SpO2: 98%  Weight: 185 lb 12.8 oz (84.3 kg)  Height: 5\' 10"  (1.778 m)    General: Well developed, well nourished, in no acute distress  Skin : Warm and dry.  Head: Normocephalic and atraumatic  Eyes: Sclera and conjunctiva clear; pupils round and reactive to light; extraocular movements intact  Ears: External normal; canals clear; tympanic membranes normal  Oropharynx: Pink, supple. No suspicious lesions  Neck: Supple without thyromegaly, adenopathy  Lungs: Respirations unlabored;  Neurologic: Alert and oriented;  speech intact; face symmetrical; moves all extremities well; CNII-XII intact without focal deficit    Assessment:  1. Essential hypertension   2. Syncope and collapse     Plan:  Cardiology has been managing patient's needs and he is scheduled to see them next week; during course of visit, he checked with his wife who verified that he has not been taking Norvasc or Propanolol; will hold Losartan and Jardiance as well; he will use Losartan as needed if blood pressure is above 140; stay on the Imdur that his cardiologist has been prescribing for him; stressed need to keep that appointment next week and to take his bottle to the appointment next week as well;   Time spent 30 minutes  No follow-ups on file.  No orders of the defined types were placed in this encounter.   Requested Prescriptions    No prescriptions requested or ordered in this encounter

## 2022-09-06 IMAGING — CT CT ABD-PELV W/ CM
2 of 5 series · 16 of 46 positions shown, 18 images · IV contrast (Omnipaque)
Comparison: CT angiogram chest, abdomen and pelvis 06/24/2015.

CLINICAL DATA: Lower abdominal pain and syncopal episode today.
History of colonoscopy 06/04/2020.

EXAM:
CT ABDOMEN AND PELVIS WITH CONTRAST
TECHNIQUE: Multidetector CT imaging of the abdomen and pelvis was performed
using the standard protocol following bolus administration of
intravenous contrast.
CONTRAST:  100 mL OMNIPAQUE IOHEXOL 300 MG/ML  SOLN

[Series 2: axial st · axial · 0.95mm/px · z∈[-549,-39]mm · 13 of 115 slices shown, 15 images]
[im 7/115  soft-tissue]
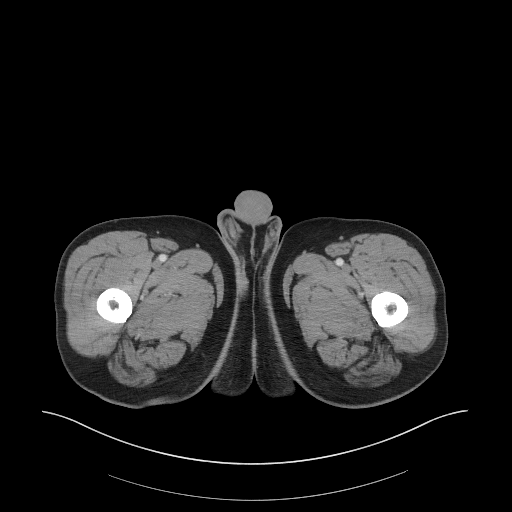
[im 7/115  bone]
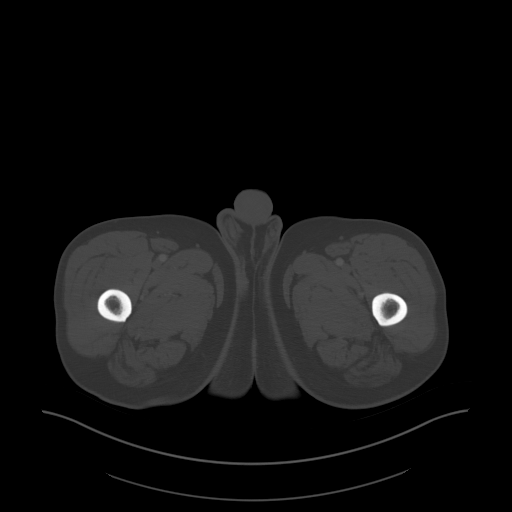
[im 19/115  soft-tissue]
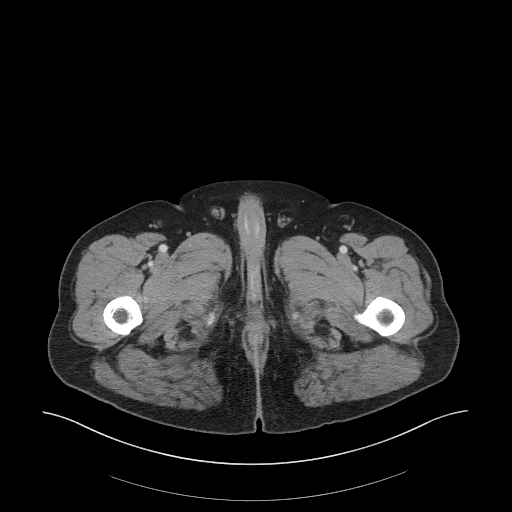
[im 25/115  soft-tissue]
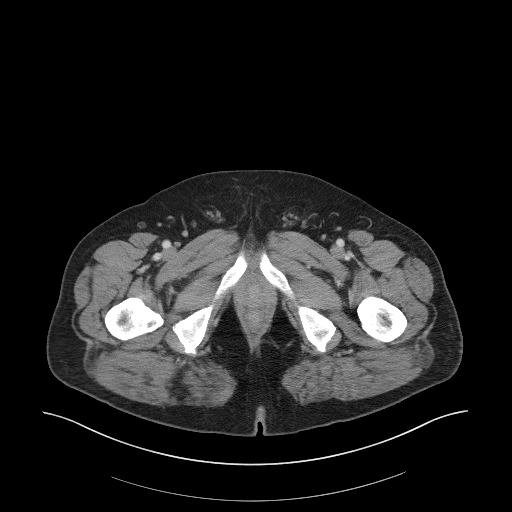
[im 31/115  soft-tissue]
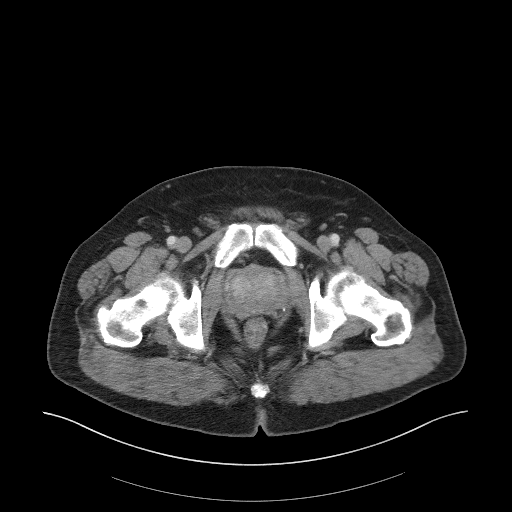
[im 43/115  soft-tissue]
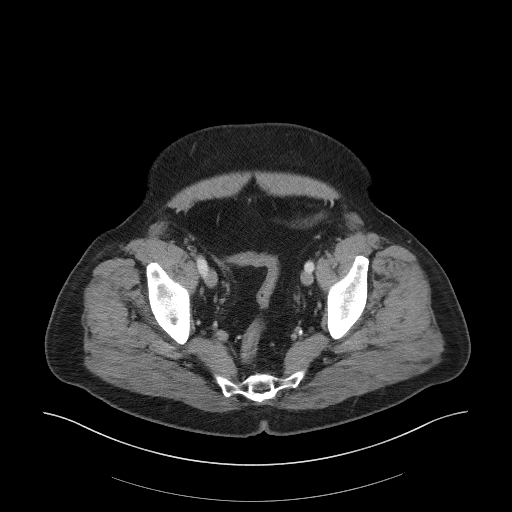
[im 49/115  soft-tissue]
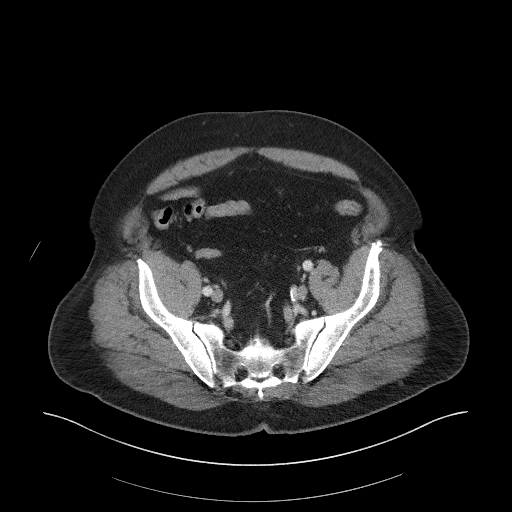
[im 61/115  soft-tissue]
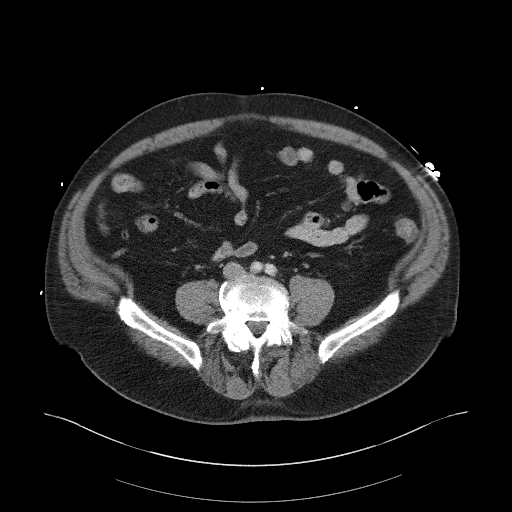
[im 67/115  soft-tissue]
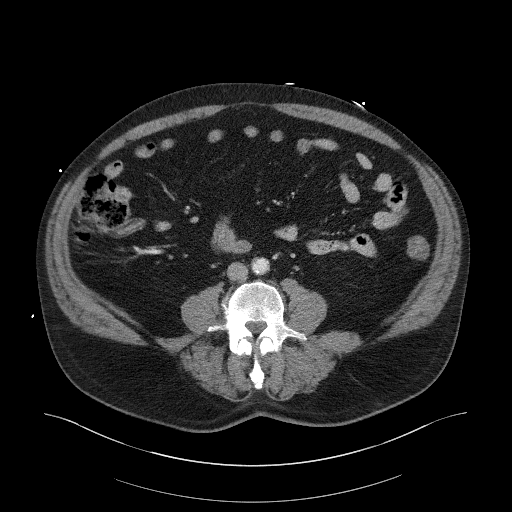
[im 73/115  soft-tissue]
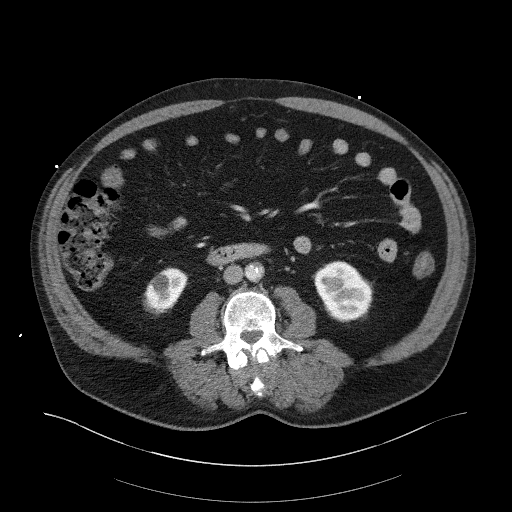
[im 73/115  bone]
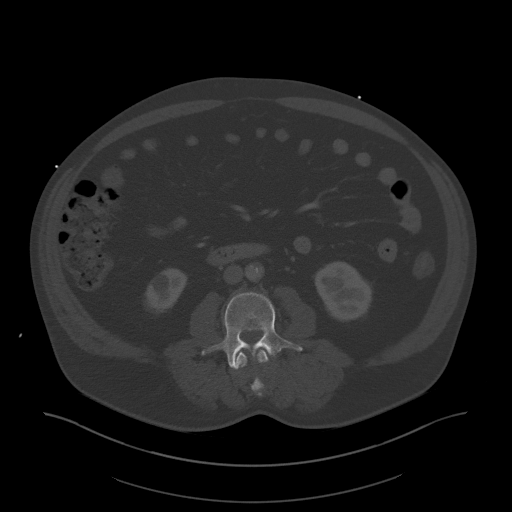
[im 85/115  soft-tissue]
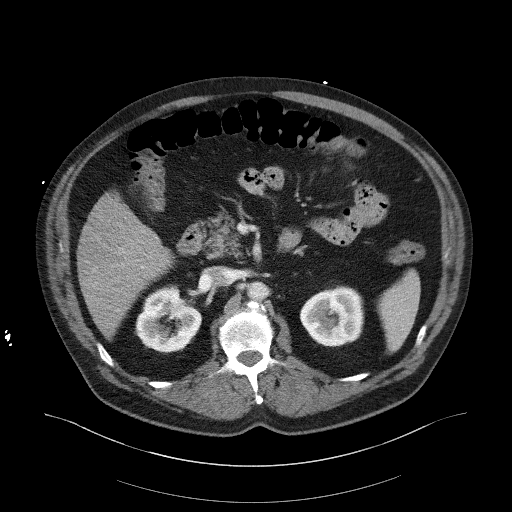
[im 91/115  soft-tissue]
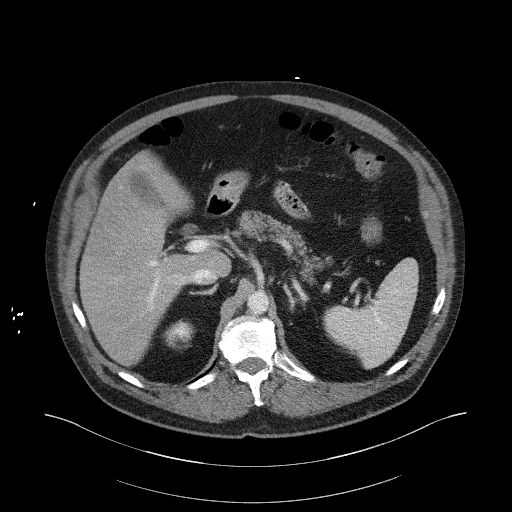
[im 97/115  soft-tissue]
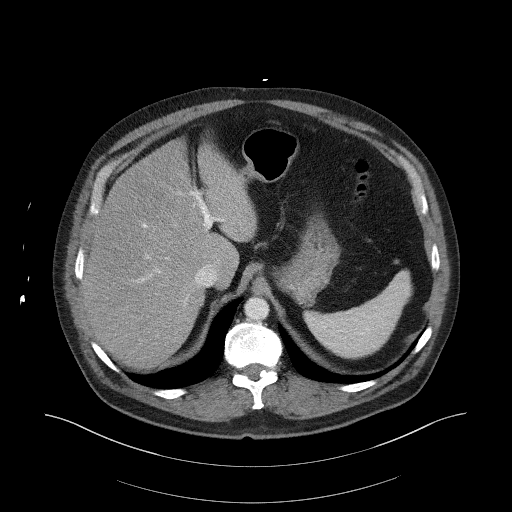
[im 109/115  soft-tissue]
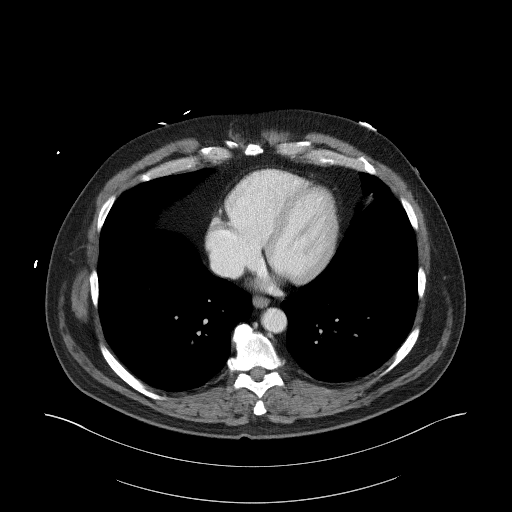

[Series 5: coronal st · coronal · 0.88mm/px · 3 of 117 slices shown]
[im 39/117  soft-tissue]
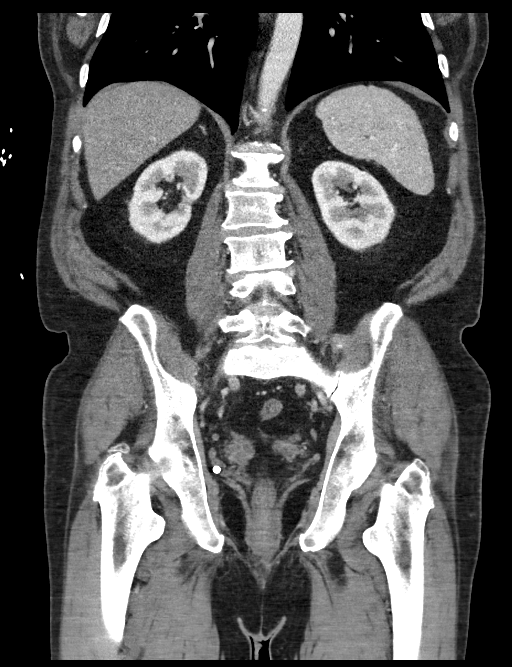
[im 52/117  soft-tissue]
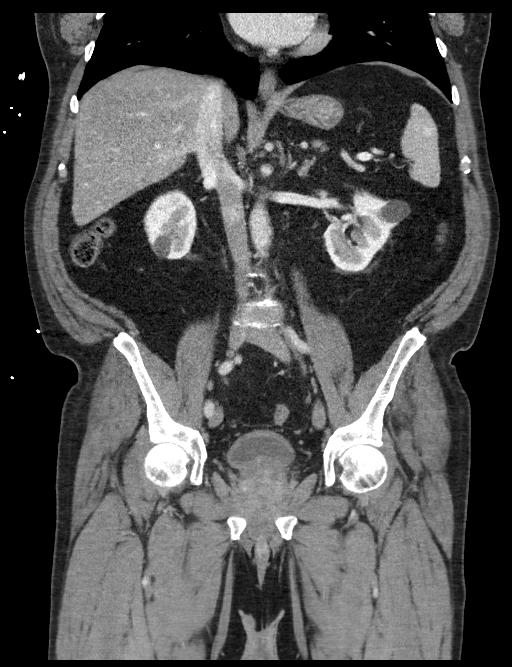
[im 65/117  soft-tissue]
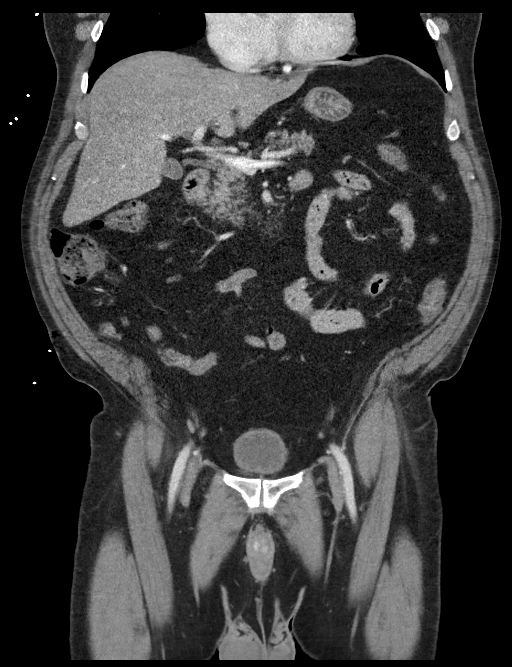

[16 of 46 positions shown; findings below may reference images not displayed]

FINDINGS: Lower chest: Lung bases clear.  No pleural or pericardial effusion.

Hepatobiliary: No focal liver abnormality is seen. No gallstones,
gallbladder wall thickening, or biliary dilatation. The liver is
diffusely low attenuating consistent with fatty infiltration.

Pancreas: Unremarkable. No pancreatic ductal dilatation or
surrounding inflammatory changes.

Spleen: Normal in size. Peripherally enhancing lesion in the
anterior margin of the spleen measuring 2.2 cm in diameter is most
consistent with a hemangioma and unchanged.

Adrenals/Urinary Tract: The adrenal glands appear normal. 2-3 small
cysts are seen in each kidney. The kidneys otherwise appear normal.
Ureters and urinary bladder are negative.

Stomach/Bowel: Stomach is within normal limits. Appendix appears
normal. No evidence of bowel wall thickening, distention, or
inflammatory changes.

Vascular/Lymphatic: No significant vascular findings are present. No
enlarged abdominal or pelvic lymph nodes.

Reproductive: Mild prostatomegaly.

Other: No free intraperitoneal air or fluid collection.

Musculoskeletal: No acute or focal abnormality.
IMPRESSION: No acute abnormality or finding to explain the patient's symptoms.

Fatty infiltration of the liver.

Unchanged small lesion in the spleen most consistent with a
hemangioma.

Aortic Atherosclerosis ([id number]).

## 2022-09-20 ENCOUNTER — Telehealth: Payer: Self-pay | Admitting: Family

## 2022-09-20 ENCOUNTER — Other Ambulatory Visit: Payer: Self-pay | Admitting: Family

## 2022-09-20 MED ORDER — CLOPIDOGREL BISULFATE 75 MG PO TABS: 75.0000 mg | ORAL_TABLET | Freq: Every day | ORAL | 3 refills | Status: DC

## 2022-09-20 NOTE — Telephone Encounter (Signed)
Patient needs refill on his clopidogrel but he no longer sees the provider who originally prescribed so he needs a new script sent to [facility] on [address] in [place]. Please update his pharmacy to this location as well.

## 2022-11-03 ENCOUNTER — Other Ambulatory Visit: Payer: Self-pay | Admitting: Family

## 2022-11-29 ENCOUNTER — Other Ambulatory Visit: Payer: Self-pay | Admitting: Family

## 2022-12-05 ENCOUNTER — Other Ambulatory Visit: Payer: Self-pay

## 2022-12-05 ENCOUNTER — Telehealth: Payer: Self-pay | Admitting: Family

## 2022-12-05 MED ORDER — BUSPIRONE HCL 7.5 MG PO TABS: ORAL_TABLET | ORAL | 1 refills | Status: DC

## 2022-12-05 NOTE — Telephone Encounter (Signed)
Spoke with pt, Rx was sent to the wrong pharmacy. Pharmacy has been updated and Rx has been sent into correct pharmacy.

## 2022-12-05 NOTE — Telephone Encounter (Signed)
Prescription Request  12/05/2022  Is this a "Controlled Substance" medicine? No  LOV: 07/14/2022  What is the name of the medication or equipment? busPIRone (BUSPAR) 7.5 MG tablet   Have you contacted your pharmacy to request a refill? Yes   Which pharmacy would you like this sent to?  [facility],  Address: [address] [place], [place] [address] Phone: [phone]  Patient notified that their request is being sent to the clinical staff for review and that they should receive a response within 2 business days.   Please advise at [facility] [phone]

## 2022-12-11 ENCOUNTER — Telehealth: Payer: Self-pay | Admitting: Family

## 2022-12-11 NOTE — Telephone Encounter (Signed)
Pt states pharmacy did not receive busPIRone (BUSPAR) 7.5 MG tablet. Please resend.    [name] [name], [place] - [address] AT [facility] OF [facility] & [address], [place] [address] Phone: [phone]  Fax: [phone]

## 2022-12-12 MED ORDER — BUSPIRONE HCL 7.5 MG PO TABS: ORAL_TABLET | ORAL | 1 refills | Status: DC

## 2022-12-12 NOTE — Addendum Note (Signed)
Addended by: Judieth Keens on: 12/12/2022 09:53 AM   Modules accepted: Orders

## 2022-12-12 NOTE — Telephone Encounter (Signed)
Rx has been resent 

## 2023-02-15 ENCOUNTER — Ambulatory Visit: Payer: Medicare (Managed Care)

## 2023-02-15 VITALS — Ht 70.0 in | Wt 185.0 lb

## 2023-02-15 DIAGNOSIS — Z0000 Encounter for general adult medical examination without abnormal findings: Secondary | ICD-10-CM | POA: Diagnosis not present

## 2023-02-15 NOTE — Patient Instructions (Addendum)
Mr. Triggs , Thank you for taking time to come for your Medicare Wellness Visit. I appreciate your ongoing commitment to your health goals. Please review the following plan we discussed and let me know if I can assist you in the future.   Referrals/Orders/Follow-Ups/Clinician Recommendations:   This is a list of the screening recommended for you and due dates:  Health Maintenance  Topic Date Due   Complete foot exam   Never done   Eye exam for diabetics  Never done   Yearly kidney health urinalysis for diabetes  Never done   Hepatitis C Screening  Never done   DTaP/Tdap/Td vaccine (1 - Tdap) Never done   Zoster (Shingles) Vaccine (1 of 2) Never done   Pneumonia Vaccine (2 of 2 - PCV) 01/16/2015   Flu Shot  08/31/2022   COVID-19 Vaccine (4 - 2024-25 season) 10/01/2022   Hemoglobin A1C  12/23/2022   Yearly kidney function blood test for diabetes  06/22/2023   Colon Cancer Screening  12/11/2023   Medicare Annual Wellness Visit  02/15/2024   HPV Vaccine  Aged Out    Advanced directives: (Declined) Advance directive discussed with you today. Even though you declined this today, please call our office should you change your mind, and we can give you the proper paperwork for you to fill out.  Next Medicare Annual Wellness Visit scheduled for next year: Yes

## 2023-02-15 NOTE — Progress Notes (Addendum)
Subjective:   Jeremy Ryan is a 68 y.o. male who presents for Medicare Annual/Subsequent preventive examination.  Visit Complete: Virtual I connected with  Darryl Lent on 02/15/23 by a audio enabled telemedicine application and verified that I am speaking with the correct person using two identifiers.  Patient Location: Home  Provider Location: Home Office  I discussed the limitations of evaluation and management by telemedicine. The patient expressed understanding and agreed to proceed.  Vital Signs: Because this visit was a virtual/telehealth visit, some criteria may be missing or patient reported. Any vitals not documented were not able to be obtained and vitals that have been documented are patient reported.    Cardiac Risk Factors include: advanced age (>97men, >39 women);male gender;hypertension;dyslipidemia     Objective:    Today's Vitals   02/15/23 0853  Weight: 185 lb (83.9 kg)  Height: 5\' 10"  (1.778 m)   Body mass index is 26.54 kg/m.     02/15/2023    9:00 AM 06/14/2022    6:10 PM 06/06/2022    1:56 PM 06/03/2022    5:16 PM 06/03/2022   11:20 AM 11/04/2021    3:11 PM 08/17/2021    4:46 PM  Advanced Directives  Does Patient Have a Medical Advance Directive? No No No No No No No  Would patient like information on creating a medical advance directive? No - Patient declined No - Patient declined No - Patient declined No - Patient declined  No - Patient declined     Current Medications (verified) Outpatient Encounter Medications as of 02/15/2023  Medication Sig   acetaminophen (TYLENOL) 325 MG tablet Take 2-3 tablets (650-975 mg total) by mouth every 6 (six) hours as needed.   amLODipine (NORVASC) 10 MG tablet Take by mouth. (Patient not taking: Reported on 07/14/2022)   aspirin EC 81 MG tablet Take 81 mg by mouth daily. Swallow whole.   BLACK CURRANT SEED OIL PO Take 1 capsule by mouth daily.   busPIRone (BUSPAR) 5 MG tablet Take 5 mg by mouth at bedtime.    busPIRone (BUSPAR) 7.5 MG tablet TAKE 1 TABLET BY MOUTH IN THE MORNING AND 1 TABLET AT LUNCHTIME AS DIRECTED   Cinnamon 500 MG capsule Take 500 mg by mouth daily.   clopidogrel (PLAVIX) 75 MG tablet Take 1 tablet (75 mg total) by mouth daily.   isosorbide mononitrate (IMDUR) 30 MG 24 hr tablet Take 1 tablet (30 mg total) by mouth daily.   JARDIANCE 25 MG TABS tablet Take 25 mg by mouth daily. (Patient not taking: Reported on 07/14/2022)   losartan (COZAAR) 25 MG tablet Take 0.5 tablets (12.5 mg total) by mouth daily. (Patient not taking: Reported on 07/14/2022)   Multiple Vitamins-Minerals (MULTIVITAMIN WITH MINERALS) tablet Take 1 tablet by mouth daily.   ondansetron (ZOFRAN-ODT) 4 MG disintegrating tablet Take 1 tablet (4 mg total) by mouth every 8 (eight) hours as needed for nausea or vomiting.   oxyCODONE (OXY IR/ROXICODONE) 5 MG immediate release tablet Take 1 tablet (5 mg total) by mouth every 6 (six) hours as needed for moderate pain. (Patient not taking: Reported on 02/15/2023)   pantoprazole (PROTONIX) 40 MG tablet TAKE 1 TABLET (40 MG TOTAL) BY MOUTH TWICE A DAY BEFORE MEALS   propranolol (INDERAL) 40 MG tablet Take by mouth. (Patient not taking: Reported on 07/14/2022)   No facility-administered encounter medications on file as of 02/15/2023.    Allergies (verified) Patient has no known allergies.   History: Past Medical History:  Diagnosis Date   Colon polyps    Coronary artery disease    History of blood clots    Hypertension    Past Surgical History:  Procedure Laterality Date   CHOLECYSTECTOMY N/A 06/06/2022   Procedure: LAPAROSCOPIC CHOLECYSTECTOMY;  Surgeon: Fritzi Mandes, MD;  Location: [facility];  Service: General;  Laterality: N/A;   CORONARY STENT INTERVENTION     ENDOSCOPIC RETROGRADE CHOLANGIOPANCREATOGRAPHY (ERCP) WITH PROPOFOL N/A 06/05/2022   Procedure: ENDOSCOPIC RETROGRADE CHOLANGIOPANCREATOGRAPHY (ERCP) WITH PROPOFOL;  Surgeon: Vida Rigger, MD;  Location: [facility];  Service: Gastroenterology;  Laterality: N/A;   REMOVAL OF STONES  06/05/2022   Procedure: REMOVAL OF STONES;  Surgeon: Vida Rigger, MD;  Location: [facility];  Service: Gastroenterology;;   Dennison Mascot  06/05/2022   Procedure: Dennison Mascot;  Surgeon: Vida Rigger, MD;  Location: [facility];  Service: Gastroenterology;;   History reviewed. No pertinent family history. Social History   Socioeconomic History   Marital status: Married    Spouse name: Not on file   Number of children: Not on file   Years of education: Not on file   Highest education level: Not on file  Occupational History   Not on file  Tobacco Use   Smoking status: Former   Smokeless tobacco: Never  Vaping Use   Vaping status: Never Used  Substance and Sexual Activity   Alcohol use: Never   Drug use: Never   Sexual activity: Not on file  Other Topics Concern   Not on file  Social History Narrative   Not on file   Social Drivers of Health   Financial Resource Strain: Low Risk  (02/15/2023)   Overall Financial Resource Strain (CARDIA)    Difficulty of Paying Living Expenses: Not hard at all  Food Insecurity: No Food Insecurity (02/15/2023)   Hunger Vital Sign    Worried About Running Out of Food in the Last Year: Never true    Ran Out of Food in the Last Year: Never true  Transportation Needs: No Transportation Needs (02/15/2023)   PRAPARE - Administrator, Civil Service (Medical): No    Lack of Transportation (Non-Medical): No  Physical Activity: Sufficiently Active (02/15/2023)   Exercise Vital Sign    Days of Exercise per Week: 7 days    Minutes of Exercise per Session: 30 min  Stress: No Stress Concern Present (02/15/2023)   [employer] of Occupational Health - Occupational Stress Questionnaire    Feeling of Stress : Not at all  Social Connections: Socially Integrated (02/15/2023)   Social Connection and Isolation Panel [NHANES]    Frequency of Communication with Friends  and Family: More than three times a week    Frequency of Social Gatherings with Friends and Family: More than three times a week    Attends Religious Services: More than 4 times per year    Active Member of [employer] or Organizations: Yes    Attends Engineer, structural: More than 4 times per year    Marital Status: Married    Tobacco Counseling Counseling given: Not Answered   Clinical Intake:  Pre-visit preparation completed: Yes  Pain : No/denies pain     BMI - recorded: 26.54 Nutritional Status: BMI 25 -29 Overweight Nutritional Risks: None Diabetes: No  How often do you need to have someone help you when you read instructions, pamphlets, or other written materials from your doctor or pharmacy?: 1 - Never  Interpreter Needed?: No  Information entered by :: Theresa Mulligan  LPN   Activities of Daily Living    02/15/2023    9:25 AM 06/15/2022   12:28 AM  In your present state of health, do you have any difficulty performing the following activities:  Hearing? 0   Vision? 0   Difficulty concentrating or making decisions? 0   Walking or climbing stairs? 0   Dressing or bathing? 0   Doing errands, shopping? 0 0  Preparing Food and eating ? N   Using the Toilet? N   In the past six months, have you accidently leaked urine? N   Do you have problems with loss of bowel control? N   Managing your Medications? N   Managing your Finances? N   Housekeeping or managing your Housekeeping? N     Patient Care Team: Olive Bass, FNP as PCP - General (Internal Medicine) [name], MD (Gastroenterology) Nile Dear, DO as Referring Physician (Endocrinology) Diamond Nickel., MD as Referring Physician (Cardiology)  Indicate any recent Medical Services you may have received from other than [facility] providers in the past year (date may be approximate).     Assessment:   This is a routine wellness examination for [place]itt.  Hearing/Vision screen Hearing  Screening - Comments:: Denies hearing difficulties   Vision Screening - Comments:: Wears rx glasses - up to date with routine eye exams with  Dr Judithe Modest   Goals Addressed               This Visit's Progress     Stay Active (pt-stated)         Depression Screen    02/15/2023    8:59 AM 11/04/2021    3:13 PM 09/30/2021   10:18 AM 09/01/2021    9:36 AM  PHQ 2/9 Scores  PHQ - 2 Score 0 0 0 0    Fall Risk    02/15/2023    9:00 AM 03/21/2022    2:03 PM 11/04/2021    3:13 PM 10/29/2021    7:15 AM 09/30/2021   10:18 AM  Fall Risk   Falls in the past year? 0 0 0 0 0  Number falls in past yr: 0 0 0  0  Injury with Fall? 0 0 0  0  Risk for fall due to : No Fall Risks No Fall Risks No Fall Risks  No Fall Risks  Follow up Falls prevention discussed Falls evaluation completed Falls evaluation completed  Falls evaluation completed    MEDICARE RISK AT HOME: Medicare Risk at Home Any stairs in or around the home?: No If so, are there any without handrails?: No Home free of loose throw rugs in walkways, pet beds, electrical cords, etc?: Yes Adequate lighting in your home to reduce risk of falls?: Yes Life alert?: No Use of a cane, walker or w/c?: No Grab bars in the bathroom?: Yes Shower chair or bench in shower?: No Elevated toilet seat or a handicapped toilet?: No  TIMED UP AND GO:  Was the test performed?  No    Cognitive Function:        02/15/2023    9:01 AM 11/04/2021    3:36 PM  6CIT Screen  What Year? 0 points 0 points  What month? 0 points 0 points  What time? 0 points 0 points  Count back from 20 0 points 0 points  Months in reverse 0 points 0 points  Repeat phrase 0 points 8 points  Total Score 0 points 8 points  Immunizations Immunization History  Administered Date(s) Administered   Fluad Quad(high Dose 65+) 10/14/2018   Influenza Inj Mdck Quad Pf 10/07/2017   Influenza, High Dose Seasonal PF 12/16/2020   Influenza,inj,Quad PF,6+ Mos 12/07/2014    Influenza,inj,quad, With Preservative 11/17/2013   PFIZER Comirnaty(Gray Top)Covid-19 Tri-Sucrose Vaccine 04/14/2019, 05/12/2019, 03/02/2020   Pneumococcal Polysaccharide-23 01/15/2014    TDAP status: Due, Education has been provided regarding the importance of this vaccine. Advised may receive this vaccine at local pharmacy or Health Dept. Aware to provide a copy of the vaccination record if obtained from local pharmacy or Health Dept. Verbalized acceptance and understanding.  Flu Vaccine status: Due, Education has been provided regarding the importance of this vaccine. Advised may receive this vaccine at local pharmacy or Health Dept. Aware to provide a copy of the vaccination record if obtained from local pharmacy or Health Dept. Verbalized acceptance and understanding.  Pneumococcal vaccine status: Due, Education has been provided regarding the importance of this vaccine. Advised may receive this vaccine at local pharmacy or Health Dept. Aware to provide a copy of the vaccination record if obtained from local pharmacy or Health Dept. Verbalized acceptance and understanding.  Covid-19 vaccine status: Declined, Education has been provided regarding the importance of this vaccine but patient still declined. Advised may receive this vaccine at local pharmacy or Health Dept.or vaccine clinic. Aware to provide a copy of the vaccination record if obtained from local pharmacy or Health Dept. Verbalized acceptance and understanding.  Qualifies for Shingles Vaccine? Yes   Zostavax completed No   Shingrix Completed?: No.    Education has been provided regarding the importance of this vaccine. Patient has been advised to call insurance company to determine out of pocket expense if they have not yet received this vaccine. Advised may also receive vaccine at local pharmacy or Health Dept. Verbalized acceptance and understanding.  Screening Tests Health Maintenance  Topic Date Due   FOOT EXAM  Never done    OPHTHALMOLOGY EXAM  Never done   Diabetic kidney evaluation - Urine ACR  Never done   Hepatitis C Screening  Never done   DTaP/Tdap/Td (1 - Tdap) Never done   Zoster Vaccines- Shingrix (1 of 2) Never done   Pneumonia Vaccine 2+ Years old (2 of 2 - PCV) 01/16/2015   INFLUENZA VACCINE  08/31/2022   COVID-19 Vaccine (4 - 2024-25 season) 10/01/2022   HEMOGLOBIN A1C  12/23/2022   Diabetic kidney evaluation - eGFR measurement  06/22/2023   Colonoscopy  12/11/2023   Medicare Annual Wellness (AWV)  02/15/2024   HPV VACCINES  Aged Out    Health Maintenance  Health Maintenance Due  Topic Date Due   FOOT EXAM  Never done   OPHTHALMOLOGY EXAM  Never done   Diabetic kidney evaluation - Urine ACR  Never done   Hepatitis C Screening  Never done   DTaP/Tdap/Td (1 - Tdap) Never done   Zoster Vaccines- Shingrix (1 of 2) Never done   Pneumonia Vaccine 22+ Years old (2 of 2 - PCV) 01/16/2015   INFLUENZA VACCINE  08/31/2022   COVID-19 Vaccine (4 - 2024-25 season) 10/01/2022   HEMOGLOBIN A1C  12/23/2022    Colorectal cancer screening: Type of screening: Colonoscopy. Completed 12/10/20. Repeat every 3 years   Additional Screening:  Hepatitis C Screening: does qualify; Deferred  Vision Screening: Recommended annual ophthalmology exams for early detection of glaucoma and other disorders of the eye. Is the patient up to date with their annual eye exam?  Yes  Who is the provider or what is the name of the office in which the patient attends annual eye exams? Dr Judithe Modest If pt is not established with a provider, would they like to be referred to a provider to establish care? No .   Dental Screening: Recommended annual dental exams for proper oral hygiene  Diabetic Foot Exam: Diabetic Foot Exam: Overdue, Pt has been advised about the importance in completing this exam. Pt is scheduled for diabetic foot exam on Deferred.  Community Resource Referral / Chronic Care Management:  CRR required this  visit?  No   CCM required this visit?  No     Plan:     I have personally reviewed and noted the following in the patient's chart:   Medical and social history Use of alcohol, tobacco or illicit drugs  Current medications and supplements including opioid prescriptions. Patient is not currently taking opioid prescriptions. Functional ability and status Nutritional status Physical activity Advanced directives List of other physicians Hospitalizations, surgeries, and ER visits in previous 12 months Vitals Screenings to include cognitive, depression, and falls Referrals and appointments  In addition, I have reviewed and discussed with patient certain preventive protocols, quality metrics, and best practice recommendations. A written personalized care plan for preventive services as well as general preventive health recommendations were provided to patient.     Tillie Rung, LPN   4/40/1027   After Visit Summary: (MyChart) Due to this being a telephonic visit, the after visit summary with patients personalized plan was offered to patient via MyChart   Nurse Notes: None

## 2023-02-20 ENCOUNTER — Other Ambulatory Visit: Payer: Self-pay | Admitting: Family

## 2023-02-22 ENCOUNTER — Telehealth: Payer: Self-pay | Admitting: Family

## 2023-02-22 NOTE — Telephone Encounter (Signed)
Pt requesting a refill ofPropranolol 40mg  to the [facility] [address] in [place]. Pt asks for cma to call him in regards to refill.

## 2023-02-23 ENCOUNTER — Other Ambulatory Visit: Payer: Self-pay | Admitting: Family

## 2023-02-23 DIAGNOSIS — F411 Generalized anxiety disorder: Secondary | ICD-10-CM | POA: Diagnosis not present

## 2023-02-23 DIAGNOSIS — F331 Major depressive disorder, recurrent, moderate: Secondary | ICD-10-CM | POA: Diagnosis not present

## 2023-02-23 DIAGNOSIS — G3184 Mild cognitive impairment, so stated: Secondary | ICD-10-CM | POA: Diagnosis not present

## 2023-02-23 MED ORDER — PROPRANOLOL HCL 40 MG PO TABS: 40.0000 mg | ORAL_TABLET | Freq: Three times a day (TID) | ORAL | 0 refills | Status: DC

## 2023-02-23 NOTE — Telephone Encounter (Signed)
Spoke with pt, advised pt PCP will send in a 30 day supply of 20 mg. Also scheduled pt a follow up appointment 03/02/2023.

## 2023-02-24 DIAGNOSIS — F331 Major depressive disorder, recurrent, moderate: Secondary | ICD-10-CM | POA: Insufficient documentation

## 2023-02-24 DIAGNOSIS — F411 Generalized anxiety disorder: Secondary | ICD-10-CM | POA: Insufficient documentation

## 2023-02-24 DIAGNOSIS — G3184 Mild cognitive impairment, so stated: Secondary | ICD-10-CM | POA: Insufficient documentation

## 2023-03-02 ENCOUNTER — Ambulatory Visit (INDEPENDENT_AMBULATORY_CARE_PROVIDER_SITE_OTHER): Payer: Medicare (Managed Care) | Admitting: Family

## 2023-03-02 ENCOUNTER — Encounter: Payer: Self-pay | Admitting: Family

## 2023-03-02 ENCOUNTER — Other Ambulatory Visit: Payer: Self-pay | Admitting: Family

## 2023-03-02 VITALS — BP 138/72 | HR 62 | Ht 70.0 in | Wt 207.2 lb

## 2023-03-02 DIAGNOSIS — R251 Tremor, unspecified: Secondary | ICD-10-CM | POA: Diagnosis not present

## 2023-03-02 DIAGNOSIS — Z125 Encounter for screening for malignant neoplasm of prostate: Secondary | ICD-10-CM

## 2023-03-02 DIAGNOSIS — E785 Hyperlipidemia, unspecified: Secondary | ICD-10-CM | POA: Diagnosis not present

## 2023-03-02 DIAGNOSIS — E119 Type 2 diabetes mellitus without complications: Secondary | ICD-10-CM

## 2023-03-02 LAB — CBC WITH DIFFERENTIAL/PLATELET
Basophils Absolute: 0 10*3/uL (ref 0.0–0.1)
Basophils Relative: 0.6 % (ref 0.0–3.0)
Eosinophils Absolute: 0.1 10*3/uL (ref 0.0–0.7)
Eosinophils Relative: 2.6 % (ref 0.0–5.0)
HCT: 46.9 % (ref 39.0–52.0)
Hemoglobin: 15.7 g/dL (ref 13.0–17.0)
Lymphocytes Relative: 28.6 % (ref 12.0–46.0)
Lymphs Abs: 1.5 10*3/uL (ref 0.7–4.0)
MCHC: 33.4 g/dL (ref 30.0–36.0)
MCV: 85.6 fL (ref 78.0–100.0)
Monocytes Absolute: 0.5 10*3/uL (ref 0.1–1.0)
Monocytes Relative: 9.6 % (ref 3.0–12.0)
Neutro Abs: 3 10*3/uL (ref 1.4–7.7)
Neutrophils Relative %: 58.6 % (ref 43.0–77.0)
Platelets: 184 10*3/uL (ref 150.0–400.0)
RBC: 5.47 Mil/uL (ref 4.22–5.81)
RDW: 13.3 % (ref 11.5–15.5)
WBC: 5.2 10*3/uL (ref 4.0–10.5)

## 2023-03-02 LAB — COMPREHENSIVE METABOLIC PANEL
ALT: 16 U/L (ref 0–53)
AST: 19 U/L (ref 0–37)
Albumin: 4.4 g/dL (ref 3.5–5.2)
Alkaline Phosphatase: 87 U/L (ref 39–117)
BUN: 29 mg/dL — ABNORMAL HIGH (ref 6–23)
CO2: 31 meq/L (ref 19–32)
Calcium: 9.2 mg/dL (ref 8.4–10.5)
Chloride: 103 meq/L (ref 96–112)
Creatinine, Ser: 0.9 mg/dL (ref 0.40–1.50)
GFR: 87.07 mL/min (ref >60.00)
Glucose, Bld: 104 mg/dL — ABNORMAL HIGH (ref 70–99)
Potassium: 4.5 meq/L (ref 3.5–5.1)
Sodium: 140 meq/L (ref 135–145)
Total Bilirubin: 0.7 mg/dL (ref 0.2–1.2)
Total Protein: 6.9 g/dL (ref 6.0–8.3)

## 2023-03-02 LAB — HEMOGLOBIN A1C: Hgb A1c MFr Bld: 6.3 % (ref 4.6–6.5)

## 2023-03-02 LAB — PSA: PSA: 1.48 ng/mL (ref 0.10–4.00)

## 2023-03-02 LAB — LIPID PANEL
Cholesterol: 207 mg/dL — ABNORMAL HIGH (ref 0–200)
HDL: 48.4 mg/dL (ref >39.00)
LDL Cholesterol: 131 mg/dL — ABNORMAL HIGH (ref 0–99)
NonHDL: 158.74
Total CHOL/HDL Ratio: 4
Triglycerides: 138 mg/dL (ref 0.0–149.0)
VLDL: 27.6 mg/dL (ref 0.0–40.0)

## 2023-03-02 MED ORDER — BUSPIRONE HCL 5 MG PO TABS: 5.0000 mg | ORAL_TABLET | Freq: Every evening | ORAL | 3 refills | Status: DC

## 2023-03-02 MED ORDER — BUSPIRONE HCL 7.5 MG PO TABS: ORAL_TABLET | ORAL | 3 refills | Status: DC

## 2023-03-02 MED ORDER — PROPRANOLOL HCL 40 MG PO TABS: 40.0000 mg | ORAL_TABLET | Freq: Three times a day (TID) | ORAL | 3 refills | Status: DC

## 2023-03-02 MED ORDER — ROSUVASTATIN CALCIUM 10 MG PO TABS: 10.0000 mg | ORAL_TABLET | Freq: Every day | ORAL | 3 refills | Status: AC

## 2023-03-02 NOTE — Progress Notes (Signed)
Jeremy Ryan is a 68 y.o. male with the following history as recorded in EpicCare:  Patient Active Problem List   Diagnosis Date Noted   Elevated LFTs 06/15/2022   Intraabdominal fluid collection 06/14/2022   Choledocholithiasis with obstruction 06/04/2022   Ascending cholangitis 06/03/2022   Hyperbilirubinemia 06/03/2022   History of esophageal stricture 06/03/2022   Dysphagia 01/18/2022   Episodic lightheadedness 11/02/2021   Type 2 diabetes mellitus without complication, without long-term current use of insulin (HCC) 08/18/2021   Angina pectoris (HCC) 12/28/2020   DOE (dyspnea on exertion) 12/28/2020   CAD in native artery 02/17/2020   Dyslipidemia 02/17/2020   Prediabetes 02/17/2020   Shingles 02/17/2020   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 07/07/2015   Chest pain 07/07/2015   Plantar fasciitis of right foot 11/25/2014   B12 deficiency 08/22/2013   Erectile dysfunction 08/22/2013   Essential hypertension 03/11/2013   Gastro-esophageal reflux disease without esophagitis 03/11/2013   Paroxysmal atrial fibrillation (HCC) 03/11/2013   Hyperlipidemia 03/11/2013   Benign neoplasm of colon 01/17/2013   Benign localized prostatic hyperplasia without lower urinary tract symptoms (LUTS) 11/29/2012   Skin sensation disturbance 11/29/2012   Restless legs syndrome 08/16/2012   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     aspirin EC 81 MG tablet Take 81 mg by mouth daily. Swallow whole.     BLACK CURRANT SEED OIL PO Take 1 capsule by mouth daily.     Cinnamon 500 MG capsule Take 500 mg by mouth daily.     clopidogrel (PLAVIX) 75 MG tablet Take 1 tablet (75 mg total) by mouth daily.  90 tablet 3   Multiple Vitamins-Minerals (MULTIVITAMIN WITH MINERALS) tablet Take 1 tablet by mouth daily.     busPIRone (BUSPAR) 5 MG tablet Take 1 tablet (5 mg total) by mouth at bedtime. 90 tablet 3   busPIRone (BUSPAR) 7.5 MG tablet TAKE 1 TABLET BY MOUTH IN THE MORNING AND 1 TABLET AT LUNCHTIME AS DIRECTED 180 tablet 3   ondansetron (ZOFRAN-ODT) 4 MG disintegrating tablet Take 1 tablet (4 mg total) by mouth every 8 (eight) hours as needed for nausea or vomiting. 20 tablet 0   propranolol (INDERAL) 40 MG tablet Take 1 tablet (40 mg total) by mouth 3 (three) times daily. 270 tablet 3   No current facility-administered medications for this visit.    Allergies: Patient has no known allergies.  Past Medical History:  Diagnosis Date   Colon polyps    Coronary artery disease    History of blood clots    Hypertension     Past Surgical History:  Procedure Laterality Date   CHOLECYSTECTOMY N/A 06/06/2022   Procedure: LAPAROSCOPIC CHOLECYSTECTOMY;  Surgeon: Fritzi Mandes, MD;  Location: [facility];  Service: General;  Laterality: N/A;   CORONARY STENT INTERVENTION     ENDOSCOPIC RETROGRADE CHOLANGIOPANCREATOGRAPHY (ERCP) WITH PROPOFOL N/A 06/05/2022   Procedure: ENDOSCOPIC RETROGRADE CHOLANGIOPANCREATOGRAPHY (ERCP) WITH PROPOFOL;  Surgeon: Vida Rigger, MD;  Location: [facility];  Service: Gastroenterology;  Laterality: N/A;   REMOVAL OF STONES  06/05/2022   Procedure: REMOVAL OF STONES;  Surgeon: Vida Rigger, MD;  Location: [facility];  Service: Gastroenterology;;   Dennison Mascot  06/05/2022   Procedure: SPHINCTEROTOMY;  Surgeon: Vida Rigger, MD;  Location: [facility];  Service: Gastroenterology;;    No family history on file.  Social History   Tobacco Use   Smoking status: Former   Smokeless tobacco: Never  Substance Use Topics   Alcohol use: Never    Subjective:   Follow up for medication review; no acute concerns today;  Does have CAD/ history of stent- not currently on statin/  unsure why it was stopped; Denies any chest pain, shortness of breath, blurred vision or headache     Objective:  Vitals:   03/02/23 0825 03/02/23 0858  BP: (!) 152/86 138/72  Pulse: 62   Weight: 207 lb 3.2 oz (94 kg)   Height: 5\' 10"  (1.778 m)     General: Well developed, well nourished, in no acute distress  Skin : Warm and dry.  Head: Normocephalic and atraumatic  Eyes: Sclera and conjunctiva clear; pupils round and reactive to light; extraocular movements intact  Ears: External normal; canals clear; tympanic membranes normal  Oropharynx: Pink, supple. No suspicious lesions  Neck: Supple without thyromegaly, adenopathy  Lungs: Respirations unlabored; clear to auscultation bilaterally without wheeze, rales, rhonchi  CVS exam: normal rate and regular rhythm.  Neurologic: Alert and oriented; speech intact; face symmetrical; moves all extremities well; CNII-XII intact without focal deficit   Assessment:  1. Hyperlipidemia, unspecified hyperlipidemia type   2. Diet-controlled diabetes mellitus (HCC)   3. Prostate cancer screening   4. Tremors of nervous system     Plan:  Check lipid panel; will need to consider re-starting statin;  Update Hgba1c today;  Check PSA level today;  Stable- on current dosage of Propanolol;   No follow-ups on file.  Orders Placed This Encounter  Procedures   Comp Met (CMET)   Hemoglobin A1c   Lipid panel   PSA   CBC with Differential/Platelet    Requested Prescriptions   Signed Prescriptions Disp Refills   propranolol (INDERAL) 40 MG tablet 270 tablet 3    Sig: Take 1 tablet (40 mg total) by mouth 3 (three) times daily.   busPIRone (BUSPAR) 7.5 MG tablet 180 tablet 3    Sig: TAKE 1 TABLET BY MOUTH IN THE MORNING AND 1 TABLET AT LUNCHTIME AS DIRECTED   busPIRone (BUSPAR) 5 MG tablet 90 tablet 3    Sig: Take 1 tablet (5 mg total) by mouth at bedtime.

## 2023-03-30 DIAGNOSIS — F411 Generalized anxiety disorder: Secondary | ICD-10-CM | POA: Diagnosis not present

## 2023-03-30 DIAGNOSIS — F331 Major depressive disorder, recurrent, moderate: Secondary | ICD-10-CM | POA: Diagnosis not present

## 2023-03-30 DIAGNOSIS — G3184 Mild cognitive impairment, so stated: Secondary | ICD-10-CM | POA: Diagnosis not present

## 2023-03-31 ENCOUNTER — Other Ambulatory Visit: Payer: Self-pay | Admitting: Family

## 2023-04-24 ENCOUNTER — Encounter: Payer: Self-pay | Admitting: Family

## 2023-04-24 ENCOUNTER — Ambulatory Visit (INDEPENDENT_AMBULATORY_CARE_PROVIDER_SITE_OTHER): Payer: Medicare (Managed Care) | Admitting: Family

## 2023-04-24 VITALS — BP 138/72 | HR 63 | Ht 70.0 in | Wt 211.0 lb

## 2023-04-24 DIAGNOSIS — R350 Frequency of micturition: Secondary | ICD-10-CM

## 2023-04-24 MED ORDER — TAMSULOSIN HCL 0.4 MG PO CAPS: 0.4000 mg | ORAL_CAPSULE | Freq: Every day | ORAL | 3 refills | Status: DC

## 2023-04-24 NOTE — Progress Notes (Signed)
 Jeremy Ryan is a 68 y.o. male with the following history as recorded in EpicCare:  Patient Active Problem List   Diagnosis Date Noted   Elevated LFTs 06/15/2022   Intraabdominal fluid collection 06/14/2022   Choledocholithiasis with obstruction 06/04/2022   Ascending cholangitis 06/03/2022   Hyperbilirubinemia 06/03/2022   History of esophageal stricture 06/03/2022   Dysphagia 01/18/2022   Episodic lightheadedness 11/02/2021   Type 2 diabetes mellitus without complication, without long-term current use of insulin (HCC) 08/18/2021   Angina pectoris (HCC) 12/28/2020   DOE (dyspnea on exertion) 12/28/2020   CAD in native artery 02/17/2020   Dyslipidemia 02/17/2020   Prediabetes 02/17/2020   Shingles 02/17/2020   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 07/07/2015   Chest pain 07/07/2015   Plantar fasciitis of right foot 11/25/2014   B12 deficiency 08/22/2013   Erectile dysfunction 08/22/2013   Essential hypertension 03/11/2013   Gastro-esophageal reflux disease without esophagitis 03/11/2013   Paroxysmal atrial fibrillation (HCC) 03/11/2013   Hyperlipidemia 03/11/2013   Benign neoplasm of colon 01/17/2013   Benign localized prostatic hyperplasia without lower urinary tract symptoms (LUTS) 11/29/2012   Skin sensation disturbance 11/29/2012   Restless legs syndrome 08/16/2012   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     aspirin EC 81 MG tablet Take 81 mg by mouth daily. Swallow whole.     BLACK CURRANT SEED OIL PO Take 1 capsule by mouth daily.     busPIRone (BUSPAR) 5 MG tablet Take 1 tablet (5 mg total) by mouth at bedtime. 90 tablet 3   busPIRone (BUSPAR) 7.5 MG tablet TAKE 1  TABLET BY MOUTH IN THE MORNING AND 1 TABLET AT LUNCHTIME AS DIRECTED 180 tablet 3   Cinnamon 500 MG capsule Take 500 mg by mouth daily.     clopidogrel (PLAVIX) 75 MG tablet Take 1 tablet (75 mg total) by mouth daily. 90 tablet 3   Multiple Vitamins-Minerals (MULTIVITAMIN WITH MINERALS) tablet Take 1 tablet by mouth daily.     ondansetron (ZOFRAN-ODT) 4 MG disintegrating tablet Take 1 tablet (4 mg total) by mouth every 8 (eight) hours as needed for nausea or vomiting. 20 tablet 0   propranolol (INDERAL) 40 MG tablet Take 1 tablet (40 mg total) by mouth 3 (three) times daily. 270 tablet 1   rosuvastatin (CRESTOR) 10 MG tablet Take 1 tablet (10 mg total) by mouth daily. 90 tablet 3   tamsulosin (FLOMAX) 0.4 MG CAPS capsule Take 1 capsule (0.4 mg total) by mouth daily. 30 capsule 3   No current facility-administered medications for this visit.    Allergies: Patient has no known allergies.  Past Medical History:  Diagnosis Date   Colon polyps    Coronary artery disease    History of blood clots    Hypertension     Past Surgical History:  Procedure Laterality Date   CHOLECYSTECTOMY N/A 06/06/2022   Procedure: LAPAROSCOPIC CHOLECYSTECTOMY;  Surgeon: Fritzi Mandes, MD;  Location: [facility];  Service: General;  Laterality: N/A;   CORONARY STENT INTERVENTION     ENDOSCOPIC RETROGRADE CHOLANGIOPANCREATOGRAPHY (ERCP) WITH PROPOFOL N/A 06/05/2022   Procedure: ENDOSCOPIC RETROGRADE CHOLANGIOPANCREATOGRAPHY (ERCP) WITH PROPOFOL;  Surgeon: Vida Rigger, MD;  Location: [facility];  Service:  Gastroenterology;  Laterality: N/A;   REMOVAL OF STONES  06/05/2022   Procedure: REMOVAL OF STONES;  Surgeon: Vida Rigger, MD;  Location: [facility];  Service: Gastroenterology;;   Dennison Mascot  06/05/2022   Procedure: Dennison Mascot;  Surgeon: Vida Rigger, MD;  Location: [facility];  Service: Gastroenterology;;    No family history on file.  Social History   Tobacco Use   Smoking status: Former   Smokeless  tobacco: Never  Substance Use Topics   Alcohol use: Never    Subjective:   Complaining of persistent urinary frequency; symptoms have been present x 2 years; thought originally related to diagnosis of diabetes but symptoms have persisted even after getting diabetes in remission; does have to get up to go to the bathroom 2- 3 x per night;  Hgba1c was checked 3 weeks ago at 5.7;  PSA checked at end of January- normal;   Objective:  Vitals:   04/24/23 0823  BP: 138/72  Pulse: 63  SpO2: 100%  Weight: 211 lb (95.7 kg)  Height: 5\' 10"  (1.778 m)    General: Well developed, well nourished, in no acute distress  Skin : Warm and dry.  Head: Normocephalic and atraumatic  Eyes: Sclera and conjunctiva clear; pupils round and reactive to light; extraocular movements intact  Ears: External normal; canals clear; tympanic membranes normal  Oropharynx: Pink, supple. No suspicious lesions  Neck: Supple without thyromegaly, adenopathy  Lungs: Respirations unlabored; clear to auscultation bilaterally without wheeze, rales, rhonchi  CVS exam: normal rate and regular rhythm.  Neurologic: Alert and oriented; speech intact; face symmetrical; moves all extremities well; CNII-XII intact without focal deficit  Assessment:  1. Urinary frequency     Plan:  Suspect BPH; will check urine culture today; trial of Flomax 0.4 mg daily; follow up to be determined based on symptom response.   No follow-ups on file.  Orders Placed This Encounter  Procedures   Urine Culture    Requested Prescriptions   Signed Prescriptions Disp Refills   tamsulosin (FLOMAX) 0.4 MG CAPS capsule 30 capsule 3    Sig: Take 1 capsule (0.4 mg total) by mouth daily.

## 2023-04-25 LAB — URINE CULTURE
MICRO NUMBER:: 16244532
Result:: NO GROWTH
SPECIMEN QUALITY:: ADEQUATE

## 2023-05-25 DIAGNOSIS — F411 Generalized anxiety disorder: Secondary | ICD-10-CM | POA: Diagnosis not present

## 2023-05-25 DIAGNOSIS — G3184 Mild cognitive impairment, so stated: Secondary | ICD-10-CM | POA: Diagnosis not present

## 2023-05-25 DIAGNOSIS — F331 Major depressive disorder, recurrent, moderate: Secondary | ICD-10-CM | POA: Diagnosis not present

## 2023-08-02 ENCOUNTER — Other Ambulatory Visit: Payer: Self-pay | Admitting: Family

## 2023-08-17 DIAGNOSIS — F331 Major depressive disorder, recurrent, moderate: Secondary | ICD-10-CM | POA: Diagnosis not present

## 2023-08-17 DIAGNOSIS — F411 Generalized anxiety disorder: Secondary | ICD-10-CM | POA: Diagnosis not present

## 2023-08-17 DIAGNOSIS — G3184 Mild cognitive impairment, so stated: Secondary | ICD-10-CM | POA: Diagnosis not present

## 2023-08-29 DIAGNOSIS — H903 Sensorineural hearing loss, bilateral: Secondary | ICD-10-CM | POA: Diagnosis not present

## 2023-08-31 ENCOUNTER — Ambulatory Visit: Payer: Medicare (Managed Care) | Admitting: Family

## 2023-08-31 ENCOUNTER — Encounter: Payer: Self-pay | Admitting: Family

## 2023-08-31 ENCOUNTER — Ambulatory Visit: Payer: Self-pay | Admitting: Family

## 2023-08-31 VITALS — BP 146/92 | HR 61 | Resp 16 | Ht 70.0 in | Wt 208.8 lb

## 2023-08-31 DIAGNOSIS — Z8639 Personal history of other endocrine, nutritional and metabolic disease: Secondary | ICD-10-CM | POA: Diagnosis not present

## 2023-08-31 DIAGNOSIS — I10 Essential (primary) hypertension: Secondary | ICD-10-CM

## 2023-08-31 DIAGNOSIS — Z1211 Encounter for screening for malignant neoplasm of colon: Secondary | ICD-10-CM

## 2023-08-31 DIAGNOSIS — E785 Hyperlipidemia, unspecified: Secondary | ICD-10-CM

## 2023-08-31 LAB — COMPREHENSIVE METABOLIC PANEL WITH GFR
ALT: 14 U/L (ref 0–53)
AST: 20 U/L (ref 0–37)
Albumin: 4.5 g/dL (ref 3.5–5.2)
Alkaline Phosphatase: 54 U/L (ref 39–117)
BUN: 22 mg/dL (ref 6–23)
CO2: 28 meq/L (ref 19–32)
Calcium: 9.3 mg/dL (ref 8.4–10.5)
Chloride: 104 meq/L (ref 96–112)
Creatinine, Ser: 0.84 mg/dL (ref 0.40–1.50)
GFR: 88.59 mL/min (ref >60.00)
Glucose, Bld: 109 mg/dL — ABNORMAL HIGH (ref 70–99)
Potassium: 4.2 meq/L (ref 3.5–5.1)
Sodium: 139 meq/L (ref 135–145)
Total Bilirubin: 0.8 mg/dL (ref 0.2–1.2)
Total Protein: 7.1 g/dL (ref 6.0–8.3)

## 2023-08-31 LAB — LIPID PANEL
Cholesterol: 127 mg/dL (ref 0–200)
HDL: 43.4 mg/dL (ref >39.00)
LDL Cholesterol: 58 mg/dL (ref 0–99)
NonHDL: 83.51
Total CHOL/HDL Ratio: 3
Triglycerides: 126 mg/dL (ref 0.0–149.0)
VLDL: 25.2 mg/dL (ref 0.0–40.0)

## 2023-08-31 LAB — CBC WITH DIFFERENTIAL/PLATELET
Basophils Absolute: 0 K/uL (ref 0.0–0.1)
Basophils Relative: 0.6 % (ref 0.0–3.0)
Eosinophils Absolute: 0.1 K/uL (ref 0.0–0.7)
Eosinophils Relative: 2.7 % (ref 0.0–5.0)
HCT: 45.1 % (ref 39.0–52.0)
Hemoglobin: 15.2 g/dL (ref 13.0–17.0)
Lymphocytes Relative: 22.8 % (ref 12.0–46.0)
Lymphs Abs: 1.1 K/uL (ref 0.7–4.0)
MCHC: 33.6 g/dL (ref 30.0–36.0)
MCV: 84.6 fl (ref 78.0–100.0)
Monocytes Absolute: 0.4 K/uL (ref 0.1–1.0)
Monocytes Relative: 8.6 % (ref 3.0–12.0)
Neutro Abs: 3.2 K/uL (ref 1.4–7.7)
Neutrophils Relative %: 65.3 % (ref 43.0–77.0)
Platelets: 172 K/uL (ref 150.0–400.0)
RBC: 5.33 Mil/uL (ref 4.22–5.81)
RDW: 13.9 % (ref 11.5–15.5)
WBC: 4.9 K/uL (ref 4.0–10.5)

## 2023-08-31 LAB — MICROALBUMIN / CREATININE URINE RATIO
Creatinine,U: 143.1 mg/dL
Microalb Creat Ratio: 13.9 mg/g (ref 0.0–30.0)
Microalb, Ur: 2 mg/dL — ABNORMAL HIGH (ref 0.0–1.9)

## 2023-08-31 LAB — HEMOGLOBIN A1C: Hgb A1c MFr Bld: 6.2 % (ref 4.6–6.5)

## 2023-08-31 MED ORDER — LOSARTAN POTASSIUM 25 MG PO TABS: 25.0000 mg | ORAL_TABLET | Freq: Every day | ORAL | 3 refills | Status: AC

## 2023-08-31 NOTE — Progress Notes (Signed)
 Jeremy Ryan is a 68 y.o. male with the following history as recorded in EpicCare:  Patient Active Problem List   Diagnosis Date Noted   Elevated LFTs 06/15/2022   Intraabdominal fluid collection 06/14/2022   Choledocholithiasis with obstruction 06/04/2022   Ascending cholangitis 06/03/2022   Hyperbilirubinemia 06/03/2022   History of esophageal stricture 06/03/2022   Dysphagia 01/18/2022   Episodic lightheadedness 11/02/2021   Type 2 diabetes mellitus without complication, without long-term current use of insulin  (HCC) 08/18/2021   Angina pectoris (HCC) 12/28/2020   DOE (dyspnea on exertion) 12/28/2020   CAD in native artery 02/17/2020   Dyslipidemia 02/17/2020   Prediabetes 02/17/2020   Shingles 02/17/2020   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 07/07/2015   Chest pain 07/07/2015   Plantar fasciitis of right foot 11/25/2014   B12 deficiency 08/22/2013   Erectile dysfunction 08/22/2013   Essential hypertension 03/11/2013   Gastro-esophageal reflux disease without esophagitis 03/11/2013   Paroxysmal atrial fibrillation (HCC) 03/11/2013   Hyperlipidemia 03/11/2013   Benign neoplasm of colon 01/17/2013   Benign localized prostatic hyperplasia without lower urinary tract symptoms (LUTS) 11/29/2012   Skin sensation disturbance 11/29/2012   Restless legs syndrome 08/16/2012   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     aspirin  EC 81 MG tablet Take 81 mg by mouth daily. Swallow whole.     BLACK CURRANT SEED OIL PO Take 1 capsule by mouth daily.     Cinnamon 500 MG capsule Take 500 mg by mouth daily.     clopidogrel  (PLAVIX ) 75 MG tablet Take 1 tablet (75 mg total) by mouth daily.  90 tablet 3   dicyclomine  (BENTYL ) 10 MG capsule TAKE 1 CAPSULE(10 MG) BY MOUTH FOUR TIMES DAILY WITH MEALS AND NIGHTLY AS NEEDED FOR IRRITABLE BOWEL SYNDROME     losartan  (COZAAR ) 25 MG tablet Take 1 tablet (25 mg total) by mouth daily. 90 tablet 3   Multiple Vitamins-Minerals (MULTIVITAMIN WITH MINERALS) tablet Take 1 tablet by mouth daily.     propranolol  (INDERAL ) 40 MG tablet Take 1 tablet (40 mg total) by mouth 3 (three) times daily. 270 tablet 1   rosuvastatin  (CRESTOR ) 10 MG tablet Take 1 tablet (10 mg total) by mouth daily. 90 tablet 3   tamsulosin  (FLOMAX ) 0.4 MG CAPS capsule TAKE 1 CAPSULE(0.4 MG) BY MOUTH DAILY 30 capsule 3   No current facility-administered medications for this visit.    Allergies: Patient has no known allergies.  Past Medical History:  Diagnosis Date   Colon polyps    Coronary artery disease    History of blood clots    Hypertension     Past Surgical History:  Procedure Laterality Date   CHOLECYSTECTOMY N/A 06/06/2022   Procedure: LAPAROSCOPIC CHOLECYSTECTOMY;  Surgeon: Dasie Leonor CROME, MD;  Location: [facility];  Service: General;  Laterality: N/A;   CORONARY STENT INTERVENTION     ENDOSCOPIC RETROGRADE CHOLANGIOPANCREATOGRAPHY (ERCP) WITH PROPOFOL  N/A 06/05/2022   Procedure: ENDOSCOPIC RETROGRADE CHOLANGIOPANCREATOGRAPHY (ERCP) WITH PROPOFOL ;  Surgeon: Rosalie Kitchens, MD;  Location: [facility];  Service: Gastroenterology;  Laterality: N/A;   REMOVAL OF STONES  06/05/2022   Procedure: REMOVAL OF STONES;  Surgeon: Rosalie Kitchens, MD;  Location: [facility];  Service: Gastroenterology;;  SPHINCTEROTOMY  06/05/2022   Procedure: SPHINCTEROTOMY;  Surgeon: Rosalie Kitchens, MD;  Location: [facility];  Service: Gastroenterology;;    No family history on file.  Social History   Tobacco Use   Smoking status: Former   Smokeless tobacco: Never  Substance Use Topics   Alcohol use: Never    Subjective:  4 month follow up on chronic care needs; no acute concerns today; Denies  any chest pain, shortness of breath, blurred vision or headache   Is now working with psychiatrist for anxiety- per those notes: Reduce sertraline back to 50 mg PO every morning (per patient's strong desire, he seems to think it is causing him dizziness) continue Lorazepam 1 mg at bedtime. Reduce buspirone  7.5 mg PO to once daily at bedtime with the intention of hopefully stopping it at the next visit  follow up in 3 months-    Objective:  Vitals:   08/31/23 0815 08/31/23 0823  BP: (!) 150/98 (!) 146/92  Pulse: 61   Resp: 16   SpO2: 96%   Weight: 208 lb 12.8 oz (94.7 kg)   Height: 5' 10 (1.778 m)     General: Well developed, well nourished, in no acute distress  Skin : Warm and dry.  Head: Normocephalic and atraumatic  Eyes: Sclera and conjunctiva clear; pupils round and reactive to light; extraocular movements intact  Ears: External normal; canals clear; tympanic membranes normal  Oropharynx: Pink, supple. No suspicious lesions  Neck: Supple without thyromegaly, adenopathy  Lungs: Respirations unlabored; clear to auscultation bilaterally without wheeze, rales, rhonchi  CVS exam: normal rate and regular rhythm.  Neurologic: Alert and oriented; speech intact; face symmetrical; moves all extremities well; CNII-XII intact without focal deficit   Assessment:  1. History of type 2 diabetes mellitus   2. Screening for colon cancer   3. Hyperlipidemia, unspecified hyperlipidemia type   4. Essential hypertension     Plan:  Currently in remission- check Hgba1c today; Patient notes [name] will reach out to him to schedule follow up; he is reminded that this is due in November of this year; Check lipid panel today; Re-start Losartan  25 mg every day; follow up in 1 month for re-check;    Return in about 1 month (around 10/01/2023) for follow up.  Orders Placed This Encounter  Procedures   Hemoglobin A1c   Comprehensive metabolic panel with GFR   CBC with Differential/Platelet    Lipid panel   Urine Microalbumin w/creat. ratio    Requested Prescriptions   Signed Prescriptions Disp Refills   losartan  (COZAAR ) 25 MG tablet 90 tablet 3    Sig: Take 1 tablet (25 mg total) by mouth daily.

## 2023-08-31 NOTE — Patient Instructions (Signed)
 You are due for your colonoscopy in November 2025- please reach out to Dr. Claudene in [place] to get this scheduled; if they need a referral, let us  know;

## 2023-10-02 ENCOUNTER — Ambulatory Visit: Payer: Medicare (Managed Care) | Admitting: Family

## 2023-10-02 VITALS — BP 138/70 | HR 51 | Ht 70.0 in | Wt 210.8 lb

## 2023-10-02 DIAGNOSIS — R197 Diarrhea, unspecified: Secondary | ICD-10-CM

## 2023-10-02 DIAGNOSIS — R109 Unspecified abdominal pain: Secondary | ICD-10-CM

## 2023-10-02 NOTE — Progress Notes (Signed)
 Jeremy Ryan is a 68 y.o. male with the following history as recorded in EpicCare:  Patient Active Problem List   Diagnosis Date Noted   Elevated LFTs 06/15/2022   Intraabdominal fluid collection 06/14/2022   Choledocholithiasis with obstruction 06/04/2022   Ascending cholangitis 06/03/2022   Hyperbilirubinemia 06/03/2022   History of esophageal stricture 06/03/2022   Dysphagia 01/18/2022   Episodic lightheadedness 11/02/2021   Type 2 diabetes mellitus without complication, without long-term current use of insulin  (HCC) 08/18/2021   Angina pectoris (HCC) 12/28/2020   DOE (dyspnea on exertion) 12/28/2020   CAD in native artery 02/17/2020   Dyslipidemia 02/17/2020   Prediabetes 02/17/2020   Shingles 02/17/2020   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 07/07/2015   Chest pain 07/07/2015   Plantar fasciitis of right foot 11/25/2014   B12 deficiency 08/22/2013   Erectile dysfunction 08/22/2013   Essential hypertension 03/11/2013   Gastro-esophageal reflux disease without esophagitis 03/11/2013   Paroxysmal atrial fibrillation (HCC) 03/11/2013   Hyperlipidemia 03/11/2013   Benign neoplasm of colon 01/17/2013   Benign localized prostatic hyperplasia without lower urinary tract symptoms (LUTS) 11/29/2012   Skin sensation disturbance 11/29/2012   Restless legs syndrome 08/16/2012   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     aspirin  EC 81 MG tablet Take 81 mg by mouth daily. Swallow whole.     BLACK CURRANT SEED OIL PO Take 1 capsule by mouth daily.     Cinnamon 500 MG capsule Take 500 mg by mouth daily.     clopidogrel  (PLAVIX ) 75 MG tablet Take 1 tablet (75 mg total) by mouth daily.  90 tablet 3   dicyclomine  (BENTYL ) 10 MG capsule TAKE 1 CAPSULE(10 MG) BY MOUTH FOUR TIMES DAILY WITH MEALS AND NIGHTLY AS NEEDED FOR IRRITABLE BOWEL SYNDROME     losartan  (COZAAR ) 25 MG tablet Take 1 tablet (25 mg total) by mouth daily. 90 tablet 3   Multiple Vitamins-Minerals (MULTIVITAMIN WITH MINERALS) tablet Take 1 tablet by mouth daily.     propranolol  (INDERAL ) 40 MG tablet Take 1 tablet (40 mg total) by mouth 3 (three) times daily. 270 tablet 1   rosuvastatin  (CRESTOR ) 10 MG tablet Take 1 tablet (10 mg total) by mouth daily. 90 tablet 3   tamsulosin  (FLOMAX ) 0.4 MG CAPS capsule TAKE 1 CAPSULE(0.4 MG) BY MOUTH DAILY 30 capsule 3   busPIRone  (BUSPAR ) 7.5 MG tablet Take 7.5 mg by mouth 2 (two) times daily.     No current facility-administered medications for this visit.    Allergies: Patient has no known allergies.  Past Medical History:  Diagnosis Date   Colon polyps    Coronary artery disease    History of blood clots    Hypertension     Past Surgical History:  Procedure Laterality Date   CHOLECYSTECTOMY N/A 06/06/2022   Procedure: LAPAROSCOPIC CHOLECYSTECTOMY;  Surgeon: Dasie Leonor CROME, MD;  Location: [facility];  Service: General;  Laterality: N/A;   CORONARY STENT INTERVENTION     ENDOSCOPIC RETROGRADE CHOLANGIOPANCREATOGRAPHY (ERCP) WITH PROPOFOL  N/A 06/05/2022   Procedure: ENDOSCOPIC RETROGRADE CHOLANGIOPANCREATOGRAPHY (ERCP) WITH PROPOFOL ;  Surgeon: Rosalie Kitchens, MD;  Location: [facility];  Service: Gastroenterology;  Laterality: N/A;   REMOVAL OF STONES  06/05/2022  Procedure: REMOVAL OF STONES;  Surgeon: Rosalie Kitchens, MD;  Location: [facility];  Service: Gastroenterology;;   ANNETT  06/05/2022   Procedure: ANNETT;  Surgeon: Rosalie Kitchens, MD;  Location: [facility];  Service: Gastroenterology;;    No family history on file.  Social History   Tobacco Use   Smoking status: Former   Smokeless tobacco: Never  Substance Use Topics   Alcohol use: Never     Subjective:   Presents with concerns for abdominal pain/ diarrhea x 2-3 weeks; known history of IBS; has had gallbladder removed; no vomiting/ increased nausea; no blood in stool; no recent antibiotic use; weight is stable compared to last visit; he did re-start Losartan  in early August but has taken this medication for many years in the past with no difficulty;     Objective:  Vitals:   10/02/23 1305 10/02/23 1324  BP: (!) 142/70 138/70  Pulse: (!) 51   SpO2: 99%   Weight: 210 lb 12.8 oz (95.6 kg)   Height: 5' 10 (1.778 m)     General: Well developed, well nourished, in no acute distress  Skin : Warm and dry.  Head: Normocephalic and atraumatic  Lungs: Respirations unlabored; clear to auscultation bilaterally without wheeze, rales, rhonchi  CVS exam: normal rate and regular rhythm.  Abdomen: Abdomen, firm, mildly distended; normoactive bowel sounds; no masses or hepatosplenomegaly  Musculoskeletal: No deformities; no active joint inflammation  Extremities: No edema, cyanosis, clubbing  Vessels: Symmetric bilaterally  Neurologic: Alert and oriented; speech intact; face symmetrical; moves all extremities well; CNII-XII intact without focal deficit   Assessment:  1. Diarrhea, unspecified type   2. Abdominal pain, unspecified abdominal location     Plan:  ? Etiology; will update labs today and update abd/ pelvic CT; stressed need to get back to [name] for follow up again and referral updated today; follow up to be determined here based on labs and imaging;   No follow-ups on file.  Orders Placed This Encounter  Procedures   CT ABDOMEN PELVIS W CONTRAST    Standing Status:   Future    Expiration Date:   10/01/2024    If indicated for the ordered procedure, I authorize the administration of contrast media per Radiology protocol:   Yes    Does the patient have a contrast media/X-ray dye allergy?:   No    Preferred imaging location?:   [facility]    If indicated for the  ordered procedure, I authorize the administration of oral contrast media per Radiology protocol:   Yes   CBC with Differential/Platelet   Comp Met (CMET)   Amylase   Lipase   Ambulatory referral to Gastroenterology    Referral Priority:   Routine    Referral Type:   Consultation    Referral Reason:   Specialty Services Required    Referred to Provider:   Claudene Oneil BIRCH, MD    Number of Visits Requested:   1    Requested Prescriptions    No prescriptions requested or ordered in this encounter

## 2023-10-03 ENCOUNTER — Other Ambulatory Visit: Payer: Self-pay | Admitting: Family

## 2023-10-03 ENCOUNTER — Ambulatory Visit: Payer: Self-pay | Admitting: Family

## 2023-10-03 DIAGNOSIS — D7389 Other diseases of spleen: Secondary | ICD-10-CM | POA: Diagnosis not present

## 2023-10-03 DIAGNOSIS — Z87891 Personal history of nicotine dependence: Secondary | ICD-10-CM | POA: Diagnosis not present

## 2023-10-03 DIAGNOSIS — R948 Abnormal results of function studies of other organs and systems: Secondary | ICD-10-CM

## 2023-10-03 DIAGNOSIS — K760 Fatty (change of) liver, not elsewhere classified: Secondary | ICD-10-CM | POA: Diagnosis not present

## 2023-10-03 DIAGNOSIS — Z7902 Long term (current) use of antithrombotics/antiplatelets: Secondary | ICD-10-CM | POA: Diagnosis not present

## 2023-10-03 DIAGNOSIS — R1010 Upper abdominal pain, unspecified: Secondary | ICD-10-CM | POA: Diagnosis not present

## 2023-10-03 DIAGNOSIS — K529 Noninfective gastroenteritis and colitis, unspecified: Secondary | ICD-10-CM | POA: Diagnosis not present

## 2023-10-03 DIAGNOSIS — R109 Unspecified abdominal pain: Secondary | ICD-10-CM

## 2023-10-03 DIAGNOSIS — D1809 Hemangioma of other sites: Secondary | ICD-10-CM | POA: Diagnosis not present

## 2023-10-03 DIAGNOSIS — N400 Enlarged prostate without lower urinary tract symptoms: Secondary | ICD-10-CM | POA: Diagnosis not present

## 2023-10-03 LAB — CBC WITH DIFFERENTIAL/PLATELET
Basophils Absolute: 0 K/uL (ref 0.0–0.1)
Basophils Relative: 0.9 % (ref 0.0–3.0)
Eosinophils Absolute: 0.1 K/uL (ref 0.0–0.7)
Eosinophils Relative: 2.3 % (ref 0.0–5.0)
HCT: 43.7 % (ref 39.0–52.0)
Hemoglobin: 14.5 g/dL (ref 13.0–17.0)
Lymphocytes Relative: 28 % (ref 12.0–46.0)
Lymphs Abs: 1.4 K/uL (ref 0.7–4.0)
MCHC: 33.2 g/dL (ref 30.0–36.0)
MCV: 84.7 fl (ref 78.0–100.0)
Monocytes Absolute: 0.4 K/uL (ref 0.1–1.0)
Monocytes Relative: 9.2 % (ref 3.0–12.0)
Neutro Abs: 2.9 K/uL (ref 1.4–7.7)
Neutrophils Relative %: 59.6 % (ref 43.0–77.0)
Platelets: 174 K/uL (ref 150.0–400.0)
RBC: 5.16 Mil/uL (ref 4.22–5.81)
RDW: 13.8 % (ref 11.5–15.5)
WBC: 4.9 K/uL (ref 4.0–10.5)

## 2023-10-03 LAB — COMPREHENSIVE METABOLIC PANEL WITH GFR
ALT: 12 U/L (ref 0–53)
AST: 18 U/L (ref 0–37)
Albumin: 4.4 g/dL (ref 3.5–5.2)
Alkaline Phosphatase: 72 U/L (ref 39–117)
BUN: 15 mg/dL (ref 6–23)
CO2: 31 meq/L (ref 19–32)
Calcium: 9.2 mg/dL (ref 8.4–10.5)
Chloride: 102 meq/L (ref 96–112)
Creatinine, Ser: 0.81 mg/dL (ref 0.40–1.50)
GFR: 89.51 mL/min (ref >60.00)
Glucose, Bld: 86 mg/dL (ref 70–99)
Potassium: 4.3 meq/L (ref 3.5–5.1)
Sodium: 140 meq/L (ref 135–145)
Total Bilirubin: 0.5 mg/dL (ref 0.2–1.2)
Total Protein: 7 g/dL (ref 6.0–8.3)

## 2023-10-03 LAB — AMYLASE: Amylase: 46 U/L (ref 27–131)

## 2023-10-03 LAB — LIPASE: Lipase: 251 U/L — ABNORMAL HIGH (ref 11.0–59.0)

## 2023-10-23 DIAGNOSIS — K559 Vascular disorder of intestine, unspecified: Secondary | ICD-10-CM | POA: Diagnosis not present

## 2023-11-07 ENCOUNTER — Other Ambulatory Visit: Payer: Self-pay | Admitting: Family

## 2023-11-07 MED ORDER — CLOPIDOGREL BISULFATE 75 MG PO TABS: 75.0000 mg | ORAL_TABLET | Freq: Every day | ORAL | 3 refills | Status: AC

## 2023-11-07 NOTE — Telephone Encounter (Unsigned)
 Copied from CRM [id number]. Topic: Clinical - Medication Refill >> Nov 07, 2023  1:57 PM Aisha D wrote: Medication: clopidogrel  (PLAVIX ) 75 MG tablet  Has the patient contacted their pharmacy? Yes (Agent: If no, request that the patient contact the pharmacy for the refill. If patient does not wish to contact the pharmacy document the reason why and proceed with request.) (Agent: If yes, when and what did the pharmacy advise?)  This is the patient's preferred pharmacy:  [id number], [place] - [address] AT [facility] OF [facility] & [address] [facility] [place] [address] Phone: [phone] Fax: [phone]  Is this the correct pharmacy for this prescription? Yes If no, delete pharmacy and type the correct one.   Has the prescription been filled recently? No  Is the patient out of the medication? Yes  Has the patient been seen for an appointment in the last year OR does the patient have an upcoming appointment? Yes  Can we respond through MyChart? No  Agent: Please be advised that Rx refills may take up to 3 business days. We ask that you follow-up with your pharmacy.

## 2023-12-24 ENCOUNTER — Encounter: Payer: Self-pay | Admitting: Pharmacist

## 2023-12-24 NOTE — Progress Notes (Signed)
 Pharmacy Quality Measure Review  This patient is appearing on a report for being at risk of failing the adherence measure for hypertension (ACEi/ARB) medications this calendar year.   Medication: losartan   Last fill date: 08/31/23 for 90 day supply  Reviewed recent refill history in Dr Annemarie database. Actual last refill date was 12/04/2023 for 90 day supply. Patient has no refills remaining. Next appointment with PCP is 02/07/2024 - establishing with new PCP.    Insurance report was not up to date. No action needed at this time.   Madelin Ray, PharmD Clinical Pharmacist [facility]  Population Health [phone]

## 2024-01-10 ENCOUNTER — Other Ambulatory Visit: Payer: Self-pay

## 2024-01-10 MED ORDER — PROPRANOLOL HCL 40 MG PO TABS: 40.0000 mg | ORAL_TABLET | Freq: Three times a day (TID) | ORAL | 1 refills | Status: AC

## 2024-02-07 ENCOUNTER — Encounter: Payer: Medicare (Managed Care) | Admitting: Family Medicine

## 2024-03-05 ENCOUNTER — Encounter: Admitting: Physician Assistant

## 2024-03-05 ENCOUNTER — Ambulatory Visit

## 2024-03-12 ENCOUNTER — Ambulatory Visit

## 2024-04-03 ENCOUNTER — Encounter: Admitting: Physician Assistant
